# Patient Record
Sex: Female | Born: 1955 | Race: White | Hispanic: No | Smoking: Never smoker
Health system: Southern US, Community
[De-identification: ages and names within clinical notes are randomized; demographics above are authoritative.]

## PROBLEM LIST (undated history)

## (undated) DIAGNOSIS — J189 Pneumonia, unspecified organism: Secondary | ICD-10-CM

## (undated) DIAGNOSIS — E559 Vitamin D deficiency, unspecified: Secondary | ICD-10-CM

## (undated) DIAGNOSIS — R51 Headache: Secondary | ICD-10-CM

## (undated) DIAGNOSIS — B019 Varicella without complication: Secondary | ICD-10-CM

## (undated) DIAGNOSIS — Z124 Encounter for screening for malignant neoplasm of cervix: Secondary | ICD-10-CM

## (undated) DIAGNOSIS — J45909 Unspecified asthma, uncomplicated: Secondary | ICD-10-CM

## (undated) DIAGNOSIS — E039 Hypothyroidism, unspecified: Secondary | ICD-10-CM

## (undated) DIAGNOSIS — N390 Urinary tract infection, site not specified: Secondary | ICD-10-CM

## (undated) DIAGNOSIS — R030 Elevated blood-pressure reading, without diagnosis of hypertension: Secondary | ICD-10-CM

## (undated) DIAGNOSIS — M8580 Other specified disorders of bone density and structure, unspecified site: Secondary | ICD-10-CM

## (undated) DIAGNOSIS — K602 Anal fissure, unspecified: Secondary | ICD-10-CM

## (undated) DIAGNOSIS — B059 Measles without complication: Secondary | ICD-10-CM

## (undated) DIAGNOSIS — E785 Hyperlipidemia, unspecified: Secondary | ICD-10-CM

## (undated) DIAGNOSIS — K589 Irritable bowel syndrome without diarrhea: Secondary | ICD-10-CM

## (undated) DIAGNOSIS — Z0000 Encounter for general adult medical examination without abnormal findings: Secondary | ICD-10-CM

## (undated) DIAGNOSIS — B269 Mumps without complication: Secondary | ICD-10-CM

## (undated) HISTORY — DX: Other specified disorders of bone density and structure, unspecified site: M85.80

## (undated) HISTORY — DX: Measles without complication: B05.9

## (undated) HISTORY — DX: Elevated blood-pressure reading, without diagnosis of hypertension: R03.0

## (undated) HISTORY — DX: Anal fissure, unspecified: K60.2

## (undated) HISTORY — DX: Unspecified asthma, uncomplicated: J45.909

## (undated) HISTORY — DX: Urinary tract infection, site not specified: N39.0

## (undated) HISTORY — DX: Varicella without complication: B01.9

## (undated) HISTORY — DX: Encounter for general adult medical examination without abnormal findings: Z00.00

## (undated) HISTORY — PX: BREAST SURGERY: SHX581

## (undated) HISTORY — DX: Mumps without complication: B26.9

## (undated) HISTORY — DX: Hypothyroidism, unspecified: E03.9

## (undated) HISTORY — PX: ABDOMINAL HYSTERECTOMY: SHX81

## (undated) HISTORY — DX: Hyperlipidemia, unspecified: E78.5

## (undated) HISTORY — PX: INCISE AND DRAIN ABCESS: PRO64

## (undated) HISTORY — DX: Pneumonia, unspecified organism: J18.9

## (undated) HISTORY — DX: Headache: R51

## (undated) HISTORY — DX: Vitamin D deficiency, unspecified: E55.9

## (undated) HISTORY — DX: Irritable bowel syndrome without diarrhea: K58.9

## (undated) HISTORY — PX: PILONIDAL CYST EXCISION: SHX744

## (undated) HISTORY — DX: Encounter for screening for malignant neoplasm of cervix: Z12.4

---

## 1998-10-05 ENCOUNTER — Other Ambulatory Visit: Admission: RE | Admit: 1998-10-05 | Discharge: 1998-10-05 | Payer: Self-pay | Admitting: Radiology

## 1999-02-13 ENCOUNTER — Emergency Department (HOSPITAL_COMMUNITY): Admission: EM | Admit: 1999-02-13 | Discharge: 1999-02-13 | Payer: Self-pay | Admitting: *Deleted

## 1999-02-13 ENCOUNTER — Encounter: Payer: Self-pay | Admitting: Emergency Medicine

## 1999-09-21 ENCOUNTER — Other Ambulatory Visit: Admission: RE | Admit: 1999-09-21 | Discharge: 1999-09-21 | Payer: Self-pay | Admitting: Obstetrics and Gynecology

## 2001-01-31 ENCOUNTER — Other Ambulatory Visit: Admission: RE | Admit: 2001-01-31 | Discharge: 2001-01-31 | Payer: Self-pay | Admitting: Obstetrics and Gynecology

## 2001-02-06 ENCOUNTER — Ambulatory Visit (HOSPITAL_COMMUNITY): Admission: RE | Admit: 2001-02-06 | Discharge: 2001-02-06 | Payer: Self-pay | Admitting: Obstetrics and Gynecology

## 2001-02-06 ENCOUNTER — Encounter: Payer: Self-pay | Admitting: Obstetrics and Gynecology

## 2001-02-28 ENCOUNTER — Encounter: Payer: Self-pay | Admitting: Obstetrics and Gynecology

## 2001-02-28 ENCOUNTER — Ambulatory Visit (HOSPITAL_COMMUNITY): Admission: RE | Admit: 2001-02-28 | Discharge: 2001-02-28 | Payer: Self-pay | Admitting: Obstetrics and Gynecology

## 2001-03-26 ENCOUNTER — Encounter: Payer: Self-pay | Admitting: Obstetrics and Gynecology

## 2001-03-26 ENCOUNTER — Ambulatory Visit (HOSPITAL_COMMUNITY): Admission: RE | Admit: 2001-03-26 | Discharge: 2001-03-26 | Payer: Self-pay | Admitting: Obstetrics and Gynecology

## 2001-04-17 ENCOUNTER — Inpatient Hospital Stay (HOSPITAL_COMMUNITY): Admission: RE | Admit: 2001-04-17 | Discharge: 2001-04-19 | Payer: Self-pay | Admitting: Obstetrics and Gynecology

## 2001-04-17 ENCOUNTER — Encounter (INDEPENDENT_AMBULATORY_CARE_PROVIDER_SITE_OTHER): Payer: Self-pay | Admitting: Specialist

## 2002-07-25 ENCOUNTER — Other Ambulatory Visit: Admission: RE | Admit: 2002-07-25 | Discharge: 2002-07-25 | Payer: Self-pay | Admitting: Internal Medicine

## 2003-07-28 ENCOUNTER — Other Ambulatory Visit: Admission: RE | Admit: 2003-07-28 | Discharge: 2003-07-28 | Payer: Self-pay | Admitting: Internal Medicine

## 2003-08-05 ENCOUNTER — Observation Stay (HOSPITAL_COMMUNITY): Admission: EM | Admit: 2003-08-05 | Discharge: 2003-08-06 | Payer: Self-pay | Admitting: *Deleted

## 2003-09-04 ENCOUNTER — Encounter: Admission: RE | Admit: 2003-09-04 | Discharge: 2003-09-04 | Payer: Self-pay | Admitting: Family Medicine

## 2003-09-18 ENCOUNTER — Encounter: Admission: RE | Admit: 2003-09-18 | Discharge: 2003-09-18 | Payer: Self-pay | Admitting: Family Medicine

## 2005-12-13 ENCOUNTER — Ambulatory Visit: Payer: Self-pay | Admitting: Family Medicine

## 2005-12-15 ENCOUNTER — Encounter: Admission: RE | Admit: 2005-12-15 | Discharge: 2005-12-15 | Payer: Self-pay | Admitting: Family Medicine

## 2006-07-19 ENCOUNTER — Ambulatory Visit: Payer: Self-pay | Admitting: Family Medicine

## 2006-08-01 ENCOUNTER — Ambulatory Visit: Payer: Self-pay | Admitting: Family Medicine

## 2007-08-16 LAB — HM MAMMOGRAPHY

## 2007-08-16 LAB — HM PAP SMEAR

## 2007-08-22 ENCOUNTER — Telehealth: Payer: Self-pay

## 2007-08-22 ENCOUNTER — Ambulatory Visit: Payer: Self-pay | Admitting: Family Medicine

## 2007-08-22 DIAGNOSIS — K5289 Other specified noninfective gastroenteritis and colitis: Secondary | ICD-10-CM | POA: Insufficient documentation

## 2007-08-22 DIAGNOSIS — R300 Dysuria: Secondary | ICD-10-CM | POA: Insufficient documentation

## 2007-08-22 DIAGNOSIS — R81 Glycosuria: Secondary | ICD-10-CM | POA: Insufficient documentation

## 2007-08-22 LAB — CONVERTED CEMR LAB
Bilirubin Urine: NEGATIVE
Nitrite: NEGATIVE
Specific Gravity, Urine: >=1.03
Urobilinogen, UA: 0.2
WBC Urine, dipstick: NEGATIVE
pH: 5.5

## 2007-09-06 LAB — CONVERTED CEMR LAB
Basophils Absolute: 0.1 10*3/uL (ref 0.0–0.1)
Basophils Relative: 0.9 % (ref 0.0–1.0)
Eosinophils Absolute: 0.1 10*3/uL (ref 0.0–0.6)
Eosinophils Relative: 0.7 % (ref 0.0–5.0)
HCT: 42.6 % (ref 36.0–46.0)
Hemoglobin: 14.8 g/dL (ref 12.0–15.0)
Hgb A1c MFr Bld: 5.6 % (ref 4.6–6.0)
Lymphocytes Relative: 8.2 % — ABNORMAL LOW (ref 12.0–46.0)
MCHC: 34.8 g/dL (ref 30.0–36.0)
MCV: 90.7 fL (ref 78.0–100.0)
Monocytes Absolute: 0.2 10*3/uL (ref 0.2–0.7)
Monocytes Relative: 2.7 % — ABNORMAL LOW (ref 3.0–11.0)
Neutro Abs: 6.8 10*3/uL (ref 1.4–7.7)
Neutrophils Relative %: 87.5 % — ABNORMAL HIGH (ref 43.0–77.0)
Platelets: 212 10*3/uL (ref 150–400)
RBC: 4.7 M/uL (ref 3.87–5.11)
RDW: 12.6 % (ref 11.5–14.6)
WBC: 7.8 10*3/uL (ref 4.5–10.5)

## 2007-10-04 ENCOUNTER — Encounter: Payer: Self-pay | Admitting: Family Medicine

## 2008-06-10 ENCOUNTER — Telehealth: Payer: Self-pay | Admitting: Family Medicine

## 2008-07-15 ENCOUNTER — Ambulatory Visit: Payer: Self-pay | Admitting: Family Medicine

## 2008-07-15 LAB — CONVERTED CEMR LAB
Bilirubin Urine: NEGATIVE
Glucose, Urine, Semiquant: NEGATIVE
Ketones, urine, test strip: NEGATIVE
Nitrite: NEGATIVE
Protein, U semiquant: NEGATIVE
Specific Gravity, Urine: 1.01
Urobilinogen, UA: 0.2
pH: 5.5

## 2008-07-22 ENCOUNTER — Ambulatory Visit: Payer: Self-pay | Admitting: Family Medicine

## 2008-07-22 LAB — CONVERTED CEMR LAB
ALT: 28 units/L (ref 0–35)
AST: 32 units/L (ref 0–37)
Albumin: 3.8 g/dL (ref 3.5–5.2)
Alkaline Phosphatase: 57 units/L (ref 39–117)
BUN: 17 mg/dL (ref 6–23)
Basophils Absolute: 0 10*3/uL (ref 0.0–0.1)
Basophils Relative: 0.9 % (ref 0.0–3.0)
Bilirubin, Direct: 0.1 mg/dL (ref 0.0–0.3)
CO2: 29 meq/L (ref 19–32)
Calcium: 9.3 mg/dL (ref 8.4–10.5)
Chloride: 106 meq/L (ref 96–112)
Cholesterol: 190 mg/dL (ref 0–200)
Creatinine, Ser: 0.7 mg/dL (ref 0.4–1.2)
Eosinophils Absolute: 0.1 10*3/uL (ref 0.0–0.7)
Eosinophils Relative: 1.4 % (ref 0.0–5.0)
GFR calc Af Amer: 112 mL/min
GFR calc non Af Amer: 93 mL/min
Glucose, Bld: 90 mg/dL (ref 70–99)
HCT: 40.1 % (ref 36.0–46.0)
HDL: 66.5 mg/dL (ref >39.0)
Hemoglobin: 13.8 g/dL (ref 12.0–15.0)
LDL Cholesterol: 117 mg/dL — ABNORMAL HIGH (ref 0–99)
Lymphocytes Relative: 26.3 % (ref 12.0–46.0)
MCHC: 34.5 g/dL (ref 30.0–36.0)
MCV: 92.2 fL (ref 78.0–100.0)
Monocytes Absolute: 0.4 10*3/uL (ref 0.1–1.0)
Monocytes Relative: 7.4 % (ref 3.0–12.0)
Neutro Abs: 3.5 10*3/uL (ref 1.4–7.7)
Neutrophils Relative %: 64 % (ref 43.0–77.0)
Platelets: 195 10*3/uL (ref 150–400)
Potassium: 4.5 meq/L (ref 3.5–5.1)
RBC: 4.35 M/uL (ref 3.87–5.11)
RDW: 13 % (ref 11.5–14.6)
Sodium: 140 meq/L (ref 135–145)
TSH: 3.88 microintl units/mL (ref 0.35–5.50)
Total Bilirubin: 0.6 mg/dL (ref 0.3–1.2)
Total CHOL/HDL Ratio: 2.9
Total Protein: 7.1 g/dL (ref 6.0–8.3)
Triglycerides: 34 mg/dL (ref 0–149)
VLDL: 7 mg/dL (ref 0–40)
WBC: 5.4 10*3/uL (ref 4.5–10.5)

## 2008-07-30 LAB — CONVERTED CEMR LAB: Vit D, 1,25-Dihydroxy: 18 — ABNORMAL LOW (ref 30–89)

## 2008-08-15 DIAGNOSIS — K589 Irritable bowel syndrome without diarrhea: Secondary | ICD-10-CM

## 2008-08-15 HISTORY — DX: Irritable bowel syndrome, unspecified: K58.9

## 2008-08-15 LAB — HM DEXA SCAN

## 2008-09-01 ENCOUNTER — Telehealth: Payer: Self-pay | Admitting: Family Medicine

## 2008-09-22 ENCOUNTER — Telehealth: Payer: Self-pay | Admitting: Family Medicine

## 2008-10-07 ENCOUNTER — Encounter: Payer: Self-pay | Admitting: Family Medicine

## 2008-10-29 ENCOUNTER — Telehealth: Payer: Self-pay | Admitting: Family Medicine

## 2008-10-30 ENCOUNTER — Ambulatory Visit: Payer: Self-pay | Admitting: Family Medicine

## 2008-10-30 DIAGNOSIS — H6690 Otitis media, unspecified, unspecified ear: Secondary | ICD-10-CM | POA: Insufficient documentation

## 2008-10-30 DIAGNOSIS — J209 Acute bronchitis, unspecified: Secondary | ICD-10-CM | POA: Insufficient documentation

## 2009-01-13 ENCOUNTER — Telehealth: Payer: Self-pay | Admitting: Family Medicine

## 2009-03-12 ENCOUNTER — Telehealth (INDEPENDENT_AMBULATORY_CARE_PROVIDER_SITE_OTHER): Payer: Self-pay | Admitting: *Deleted

## 2009-05-27 ENCOUNTER — Ambulatory Visit: Payer: Self-pay | Admitting: Family Medicine

## 2009-05-27 ENCOUNTER — Telehealth: Payer: Self-pay | Admitting: Family Medicine

## 2009-05-27 DIAGNOSIS — S93409A Sprain of unspecified ligament of unspecified ankle, initial encounter: Secondary | ICD-10-CM | POA: Insufficient documentation

## 2009-06-04 ENCOUNTER — Ambulatory Visit: Payer: Self-pay | Admitting: Family Medicine

## 2009-06-04 ENCOUNTER — Telehealth: Payer: Self-pay | Admitting: Family Medicine

## 2009-06-04 DIAGNOSIS — M79609 Pain in unspecified limb: Secondary | ICD-10-CM | POA: Insufficient documentation

## 2009-08-06 ENCOUNTER — Telehealth: Payer: Self-pay | Admitting: Family Medicine

## 2009-08-27 ENCOUNTER — Telehealth: Payer: Self-pay | Admitting: Family Medicine

## 2009-09-16 ENCOUNTER — Ambulatory Visit: Payer: Self-pay | Admitting: Family Medicine

## 2009-09-16 DIAGNOSIS — J1189 Influenza due to unidentified influenza virus with other manifestations: Secondary | ICD-10-CM | POA: Insufficient documentation

## 2009-09-16 DIAGNOSIS — H1030 Unspecified acute conjunctivitis, unspecified eye: Secondary | ICD-10-CM | POA: Insufficient documentation

## 2009-10-19 ENCOUNTER — Encounter: Payer: Self-pay | Admitting: Family Medicine

## 2010-09-16 NOTE — Progress Notes (Signed)
Summary: fever blister  Phone Note Call from Patient   Caller: Patient Call For: Judithann Sheen MD Summary of Call: Pt has questions about fever blisters and using creams for pre-cancerous lesions.  [phone]. [phone] Initial call taken by: Lynann Beaver CMA,  August 27, 2009 11:28 AM  Follow-up for Phone Call        Pt returned your call. Please call back asap. [phone] cell Follow-up by: Lucy Antigua,  August 27, 2009 11:57 AM  Additional Follow-up for Phone Call Additional follow up Details #1::        Fever blister started today...Marland KitchenMarland KitchenUsing Lamisil for feet. Wants to know if she can have Valtrex?  Uses [facility] ([place]). Additional Follow-up by: Lynann Beaver CMA,  August 27, 2009 1:24 PM

## 2010-09-16 NOTE — Assessment & Plan Note (Signed)
Summary: COUGH HEADACHE CHILLS ACHE WHITE SPOTS SEEING/PS   Vital Signs:  Patient profile:   55 year old female Weight:      142 pounds Temp:     98.6 degrees F oral Pulse rate:   104 / minute Pulse rhythm:   regular Resp:     16 per minute BP sitting:   120 / 82  Vitals Entered By: Lynann Beaver CMA (September 16, 2009 3:43 PM) CC: chills/mylagias x 2 days Is Patient Diabetic? No Pain Assessment Patient in pain? no        History of Present Illness: this 55 year old white married female is in complaint of onset of generalized aching and fever and chills 2 days ago with nonproductive cough and sore throat some facial pain. No nausea vomiting or diarrhea No urinary frequency nor dysuria  Allergies: No Known Drug Allergies  Past History:  Past Medical History: Last updated: 07/22/2008 PAP     2009 MAMMOGRAM   2009 DEXA-BONE DENSITY    due  09/2008 PNEUMONIA VACCINE      DT   2000 Colonscopy  will sch  SMOKER   never     Review of Systems  The patient denies anorexia, fever, weight loss, weight gain, vision loss, decreased hearing, hoarseness, chest pain, syncope, dyspnea on exertion, peripheral edema, prolonged cough, headaches, hemoptysis, abdominal pain, melena, hematochezia, severe indigestion/heartburn, hematuria, incontinence, genital sores, muscle weakness, suspicious skin lesions, transient blindness, difficulty walking, depression, unusual weight change, abnormal bleeding, enlarged lymph nodes, angioedema, breast masses, and testicular masses.    Physical Exam  General:  appears acutely L.  alert, well-developed, well-nourished, and well-hydrated.   Head:  Normocephalic and atraumatic without obvious abnormalities. No apparent alopecia or balding. Eyes:  conduct out of left eye erythematous with mild exudate, yellow Ears:  External ear exam shows no significant lesions or deformities.  Otoscopic examination reveals clear canals, tympanic membranes are intact  bilaterally without bulging, retraction, inflammation or discharge. Hearing is grossly normal bilaterally. Nose:  minimal congestion Mouth:  erythematous pharynx but not beefy red Neck:  No deformities, masses, or tenderness noted. Chest Wall:  No deformities, masses, or tenderness noted. Breasts:  not examined Lungs:  minimal rhonchino dullness, no fremitus, no crackles, and no wheezes.   Heart:  Normal rate and regular rhythm. S1 and S2 normal without gallop, murmur, click, rub or other extra sounds. Abdomen:  Bowel sounds positive,abdomen soft and non-tender without masses, organomegaly or hernias noted.   Impression & Recommendations:  Problem # 1:  CONJUNCTIVITIS, ACUTE, LEFT (ICD-372.00) Assessment New  Her updated medication list for this problem includes:    Tobramycin Sulfate 0.3 % Soln (Tobramycin sulfate) .Marland Kitchen... 1 qtt in eye tid  Orders: Prescription Created Electronically [phone])  Problem # 2:  INFLUENZA (ICD-487.8) Assessment: New  The following medications were removed from the medication list:    Valtrex 1 Gm Tabs (Valacyclovir hcl) .... Tamiflu b.i.d. x7 days prescribed today  Orders: Prescription Created Electronically [id number])  Complete Medication List: 1)  Epipen 2-pak 0.3 Mg/0.59ml Devi (Epinephrine) .... Use as directed 2)  Lamisil 250 Mg Tabs (Terbinafine hcl) .... As directed 3)  Tamiflu 75 Mg Caps (Oseltamivir phosphate) .Marland Kitchen.. 1 two times a day to help control flu 4)  Tobramycin Sulfate 0.3 % Soln (Tobramycin sulfate) .Marland Kitchen.. 1 qtt in eye tid 5)  Hydromet 5-1.5 Mg/49ml Syrp (Hydrocodone-homatropine) .Marland Kitchen.. 1-2 tsp q4h as needed cough  Patient Instructions: 1)  beginning flu  2)  good fluid in take  3)  hydromet cough  expectorant 1-2 tsp q4h as needed cough 4)  tamiflu 1 two times a dayto help control flu 5)  ibuprofen 200 mg 3-4 every 4-6 hrs for aching pain or fever 6)  tobraycin drops for conjunctivitis left eye Prescriptions: HYDROMET 5-1.5 MG/5ML SYRP  (HYDROCODONE-HOMATROPINE) 1-2 tsp q4h as needed cough  #240 cc x 1   Entered and Authorized by:   Judithann Sheen MD   Signed by:   Judithann Sheen MD on 09/16/2009   Method used:   Print then Give to Patient   RxID:   [id number] TOBRAMYCIN SULFATE 0.3 % SOLN (TOBRAMYCIN SULFATE) 1 qtt in eye tid  #5.0 cc x 0   Entered and Authorized by:   Judithann Sheen MD   Signed by:   Judithann Sheen MD on 09/16/2009   Method used:   Electronically to        [facility] # [phone]* (retail)       [address]       [place], [place]  [address]       Ph: [phone]       Fax: [phone]   RxID:   [id number] TAMIFLU 75 MG CAPS (OSELTAMIVIR PHOSPHATE) 1 two times a day TO help control flu  #10 x 1   Entered and Authorized by:   Judithann Sheen MD   Signed by:   Judithann Sheen MD on 09/16/2009   Method used:   Electronically to        [facility] # [phone]* (retail)       [address]       [place], [place]  [address]       Ph: [phone]       Fax: [phone]   RxID:   [id number]

## 2010-09-24 ENCOUNTER — Ambulatory Visit (INDEPENDENT_AMBULATORY_CARE_PROVIDER_SITE_OTHER): Payer: 59 | Admitting: Internal Medicine

## 2010-09-24 ENCOUNTER — Encounter: Payer: Self-pay | Admitting: Internal Medicine

## 2010-09-24 VITALS — BP 120/80 | HR 98 | Temp 98.8°F | Ht 62.0 in | Wt 148.0 lb

## 2010-09-24 DIAGNOSIS — J069 Acute upper respiratory infection, unspecified: Secondary | ICD-10-CM

## 2010-09-24 MED ORDER — AZITHROMYCIN 250 MG PO TABS: ORAL_TABLET | ORAL | Status: AC

## 2010-09-24 NOTE — Assessment & Plan Note (Signed)
With suspected viral conjunctivitis. Monitor eye drainage for change to mucopurulent. Notify clinic if develops. Given abx to hold for URI. Begin if sx do not improve after total duration of 8-10 days. Followup if no improvement or worsening.

## 2010-09-24 NOTE — Progress Notes (Signed)
  Subjective:    Patient ID: Susan Terrell, female    DOB: 1955-09-11, 55 y.o.   MRN: [id number]  HPI Pt presents to clinic for evaluation of eye irritation. Notes ~ 3d h/o nasal drainage/congestion and myalgias. Now with 1 d h/o clear drainage from left eye with associated ear irritation medially. No FB exposure or trauma. No change in visual acuity. No sick exposure. No alleviating or exacerbating factors.   Reviewed PMH, medications, and allergies.    Review of Systems  Constitutional: Negative for fever and chills.  HENT: Positive for congestion and rhinorrhea. Negative for ear pain and facial swelling.   Eyes: Positive for discharge. Negative for photophobia, pain, redness, itching and visual disturbance.  Respiratory: Negative for cough.         Objective:   Physical Exam  Constitutional: She appears well-developed and well-nourished. No distress.  HENT:  Head: Normocephalic and atraumatic.  Right Ear: Tympanic membrane and external ear normal.  Left Ear: Tympanic membrane and external ear normal.  Nose: Nose normal.  Mouth/Throat: Oropharynx is clear and moist. No oropharyngeal exudate.  Eyes: EOM are normal. Pupils are equal, round, and reactive to light. Right eye exhibits no discharge and no exudate. No foreign body present in the right eye. Left eye exhibits discharge. Left eye exhibits no exudate. No foreign body present in the left eye. Right conjunctiva is not injected. Right conjunctiva has no hemorrhage. Left conjunctiva is injected. Left conjunctiva has no hemorrhage.  Neurological: She is alert.  Skin: Skin is warm and dry. No rash noted. She is not diaphoretic.          Assessment & Plan:

## 2010-11-04 ENCOUNTER — Telehealth: Payer: Self-pay | Admitting: Family Medicine

## 2010-11-04 NOTE — Telephone Encounter (Signed)
Laryngitis, stuffy, and would like to be advised as to whether an office visit is needed or medication can be called in----please advise

## 2010-12-31 NOTE — Op Note (Signed)
[facility]  Patient:    Susan Terrell, Susan Terrell Visit Number: [id number] MRN: [id number]          Service Type: GYN Location: 4W [id number] 01 Attending Physician:  Malon Kindle Proc. Date: 04/17/01 Admit Date:  04/17/2001                             Operative Report  PREOPERATIVE DIAGNOSES:  Menorrhagia, corrected anemia with iron, persistent left adnexal mass.  POSTOPERATIVE DIAGNOSES:  Menorrhagia, corrected anemia with iron, persistent left adnexal mass, endometrioma of the left ovary, endometriosis of the left broad ligament, and endometriosis of the rectum.  OPERATION:  Abdominal hysterectomy, bilateral salpingo-oophorectomy.  OPERATOR:  Malachi Pro. Ambrose Mantle, M.D.  ASSISTANT:  Zenaida Niece, M.D.  ANESTHESIA:  General.  DESCRIPTION OF PROCEDURE:  The patient was brought to the operating room and placed under satisfactory general anesthesia.  The abdomen was prepped with Betadine solution.  The vagina and urethra were prepped.  Foley catheter was inserted to straight drain.  Exam revealed the uterus to be anterior, upper limit of normal size.  The adnexa was clear.  I could not feel the left adnexal mass that showed up on ultrasound.  The patient was then placed supine.  The abdomen was draped as a sterile field.  A transverse incision was made and carried in layers through the skin and subcutaneous tissue and fascia.  The fascia was separated from the rectus muscles superiorly and inferiorly.  The rectus muscle was then split in the midline.  The peritoneum was opened vertically.  Exploration of the upper abdomen revealed both kidney to feel normal.  The liver was normal.  The gallbladder was small and cystic without stones.  Exploration of the pelvis revealed some nodularity in the cul-de-sac posteriorly.  The uterus was anterior, upper limit of normal size. The anterior cul-de-sac was normal.  The right tube and ovary were normal. The  left tube and ovary, in spite of the fact that the left tube had shown to be a hydrosalpinx on ultrasound, was completely normal.  The left ovary was adherent to the posterior broad ligament and on separating the left ovary from the posterior broad ligament, endometriosis-like fluid spilled out of the ovary.  There was also about a 4 mm area of what was thought to be endometriosis on the left broad ligament.  The decision had already been made to remove the uterus, tubes, and ovaries.  Before I had separated the left ovary from the left broad ligament, pelvic washings were obtained, but these were discarded when the diagnosis seemed to be assured that it was benign. Packs and retractors were used for exposure.  The upper pedicles were clamped across.  Both round ligaments were divided with the Bovie.  A bladder flap was developed.  Then both infundibulopelvic ligaments were divided between clamps and doubly suture ligated.  The uterine vessels were skeletonized, clamped, cut, and suture ligated, and the parametrial and paracervical tissues were clamped, cut, and suture ligated.  The left uterosacral ligament and right uterosacral ligaments were clamped, cut, and suture ligated and held, and it was noted that there was nodularity on the surface of the rectum consistent with endometriosis with dark, black pigmentation under the serosa.  The rectum was kept well-free of the lower uterine segment at all times.  It seemed to be adherent in the cul-de-sac rather than to the posterior uterus.  The left vaginal  angle was entered, and then the uterus was removed by transecting the upper vagina.  Bilateral vaginal angled sutures were placed, and then the central portion of the vagina was closed with several interrupted figure-of-eight sutures of 0 Vicryl.  Liberal irrigation confirmed that additional hemostasis was required, both with suture and Bovie, and then it seemed that hemostasis was complete.   Both ureters were identified and were normal in size and caliber and configuration.  Reperitonealization was done over the vagina after uterosacral ligaments were sutured together in the midline.  At this point, all packs and retractors were removed.  The peritoneum was closed with a running suture of 0 Vicryl, the rectus muscle with interrupted 0 Vicryl, the fascia with two running sutures of 0 Vicryl, subcu with a running 3-0 Vicryl, and the skin was closed with automatic staples.  The patient seemed to tolerate the procedure well.  Blood loss was about 300 cc.  Sponge and needle counts were correct, and the patient was returned to recovery in satisfactory condition. Attending Physician:  Malon Kindle DD:  04/17/01 TD:  04/17/01 Job: [id number]

## 2010-12-31 NOTE — H&P (Signed)
[facility]  Patient:    XOIE, KREUSER Visit Number: [id number] MRN: [id number]          Service Type: Attending:  Malachi Pro. Ambrose Mantle, M.D. Dictated by:   Malachi Pro. Ambrose Mantle, M.D.                           History and Physical  SCHEDULED ADMISSION DATE:  04/17/2001.  HISTORY OF PRESENT ILLNESS:  This is a 55 year old white, married female, para 2-0-0-2, admitted to the hospital for abdominal hysterectomy, bilateral salpingo-oophorectomy because of severe menorrhagia, anemia that has been partially corrected with iron, a persistent complex left ovarian mass. Last menstrual period March 21, 2001. The patients periods occur at about 20 to 28 day intervals, last three to four days. They are heavy, sometimes she floods. She gets up during the night to change. During the day she changes her pad and tampon at the same time. On January 31, 2001 she was noted to have a hemoglobin of 9.5, hematocrit 30.3 with indices suggestive of iron deficiency anemia. She was placed on iron and by July 19, her hemoglobin had risen to 10.5 and she was advised to continue the iron. An ultrasound was ordered at the examination on June 19 to see if there was a uterine abnormality that could be corrected with a D&C. The ultrasound did not show any significant abnormality of the uterus. The endometrial stripe was normal measuring 3 to 4 mm; however, there was a complex mass measuring 2.6 x 2.4 x 2.4 cm in the left ovary. Differential diagnosis was hemorrhagic cyst, endometrioma, and a followup ultrasound was suggested. The patient underwent a repeat ultrasound on February 28, 2001. At that time the uterus again appeared to be normal except that there was evidence of a possible adenomyosis. There clearly was fluid in a dilated left fallopian tube consistent with a hydrosalpinx and again, the complex ovoid mass of the left ovary was noted measuring 2.4 x 2.9 x 2 cm. The differential at this time  was considered to either be a teratoma or dermoid. Ovarian cancer could not be completely excluded; however, the mass had not increased in size since the prior exam. Endometrioma was felt to be less likely. The patient wanted to proceed with surgery but she wanted to have a third ultrasound, so a third ultrasound was performed on March 26, 2001. Again, it showed a normal sized uterus with cystic spaces at the junction of the endometrium and myometrium suggesting adenomyosis. The right ovary was normal. The left ovary again measured 3.9 x 2.8 x 3.0 cm and inside the ovary was a hypoechoic mass measuring 2.8 x 2.4 x 2.4 cm. It was well circumscribed with fairly homogenous echoes, several focal areas peripheral increased echogenicity were again identified. The most likely etiology was still felt to either represent a teratoma or a dermoid. Fluid was again seen in the dilated fallopian tube on the left consistent with hydrosalpinx. The patient is now admitted for surgery to remove the uterus, tubes, and ovaries.  PAST MEDICAL HISTORY:  ALLERGIES:  CODEINE, MACROBID, and MACRODANTIN and IODINE.  MEDICATIONS:  She is taking iron.  OPERATIONS:  She had a Bartholins cyst marsupialization in 1983, pilonidal cyst drained, and a left elbow surgery.  ILLNESSES:  She has had no major adult illnesses. She does have mitral valve prolapse.  SOCIAL HISTORY:   She does not drink or smoke.  REVIEW OF SYSTEMS:   She has  had occasional headaches recently on the left side. She has had no slurred speech, no blurred vision, no weakness or paralysis. She also experienced some chest tightness recently. The patient does report decreased sex drive.  FAMILY HISTORY:  Mother is 69 with high blood pressure. Father in his 6s with heart trouble. Four sisters and two brothers are living and well.  PHYSICAL EXAMINATION:  GENERAL:  Well-developed, well-nourished white female in no acute distress.  VITAL  SIGNS:  Weight is 130 pounds. Blood pressure 140/90, pulse 80.  HEENT:   No cranial abnormalities, extraocular movements intact. Nose and pharynx clear.  NECK:  Supple without thyromegaly.  BREASTS:  Soft without masses.  HEART:  Normal size and sounds, no murmurs.  LUNGS:  Clear to P&A.  ABDOMEN:  Soft, nontender, no masses palpable. Liver, spleen, and kidney not felt.  PAPANICOLAOU SMEAR:  Pap smear on January 31, 2001 was satisfactory for evaluation and was considered normal. There were some benign appearing endometrial cells present but it was in the part of the cycle when one would not expect her endometrial cells to be shedding but with a normal ultrasound it was note considered significant to pursue.  PELVIC:  The vulva and vagina were clean. The cervix was clean. The uterus was anterior, normal size. Adnexa clear. It is thick on the left.  RECTAL:  Rectal exam done in June was within normal limits except the patient does have a thin anterior sphincter.  ADMITTING IMPRESSION: 1. Persistent left adnexal mass. 2. Menorrhagia. 3. Anemia.  PLAN:  The patient is admitted for abdominal hysterectomy and bilateral salpingo-oophorectomy. She understands that if the mass is malignant, that she may require a second incision to evaluate her lymph nodes, but the mass is considered to be a very low likelihood of malignancy. She understands the risks included heart attack, stroke, pulmonary embolus, wound disruption, hemorrhage with need for reoperation and/or transfusion, fistula formation, nerve injury, intestinal obstruction, and infection. She also understands that it might have an unpredictable effect on her sex drive which is already low. She understands, as well as her husband understands, and they agree to proceed. Dictated by:   Malachi Pro. Ambrose Mantle, M.D. Attending:  Malachi Pro. Ambrose Mantle, M.D. DD:  04/16/01 TD:  04/16/01 Job: [id number]

## 2010-12-31 NOTE — Discharge Summary (Signed)
[facility]  Patient:    Susan Terrell, Susan Terrell Visit Number: [id number] MRN: [id number]          Service Type: GYN Location: 4W 0446 01 Attending Physician:  Malon Kindle Dictated by:   Malachi Pro. Ambrose Mantle, M.D. Admit Date:  04/17/2001 Discharge Date: 04/19/2001                             Discharge Summary  HISTORY OF PRESENT ILLNESS:  The patient is a 54 year old white female with menorrhagia and anemia corrected with iron, and persistent left adnexal mass, admitted for hysterectomy and bilateral salpingo-oophorectomy.  HOSPITAL COURSE:  The patient underwent the procedure on 04/17/01, with findings of the left ovary enlarged and adherent to the left posterior broad ligament. It had a chocolate cyst present that ruptured during the removal.  There was endometriosis on the surface of the rectum, and there was a left broad ligament endometriosis.  Hysterectomy and bilateral salpingo-oophorectomy performed by Dr. Ambrose Mantle with Dr. Derrel Nip assisting under general anesthesia with blood loss about 300 cc.  Postoperatively, the patient did quite well. She was given a suppository on the first postoperative day, did not pass flatus, but her abdomen has remained soft and nontender.  She is tolerating a liquid diet.  On the second postoperative day, she was felt to be a candidate for discharge.  Her bowel sounds were normal, but there was no flatus. Pathology report showed uterus, ovaries, and tubes with slight cervicitis. Secretory endometrium, adenomyosis, right and left ovaries benign follicular cysts, no endometriosis or evidence of malignancy identified.  I will have the pathologist relook at the left ovary because clinically there was certainly endometriosis, evidenced by 3 months enlargement of the left ovary without resolution, a cyst that contained internal echos, chocolate fluid coming out of the cyst when it ruptured during the removal, endometriosis  on the serosa of the rectum, and endometriosis on the left broad ligament.  White count was 6300, hemoglobin 12.6, hematocrit 37.8, platelets 213,000.  Followup hematocrit 33.6 and 33.3, 71 segs, 18 lymphs, 6 monos, 5 eosinophils, and 0 basophils.  Comprehensive metabolic panel was normal.  Urinalysis was negative.  EKG showed marked sinus bradycardia.  FINAL DIAGNOSES: 1. Persistent left adnexal mass secondary to a chocolate cyst of the left    ovary that was adherent to the posterior leaf of the broad ligament,    however, no endometriosis identified on path.  The pathologist will be    asked to relook at the specimen. 2. Adenomyosis. 3. Menorrhagia. 4. History of anemia, corrected with iron.  OPERATION:  Abdominal hysterectomy and bilateral salpingo-oophorectomy.  CONDITION ON DISCHARGE:  Improved.  DISCHARGE INSTRUCTIONS: 1. Our regular discharge instructions. 2. No vaginal entrance. 3. No heavy lifting. 4. No strenuous activity. 5. Call with fever above 100.4 degrees.  Call with any unusual problems,    including vaginal bleeding or signs of an intestinal obstruction, including    nausea, vomiting, bloating of the abdomen and distention.  FOLLOWUP:  The patient is to return in 4 days for staple removal.  DISCHARGE MEDICATIONS:  Mepergan Fortis 16 tablets one q.4-6h. p.r.n. pain. Dictated by:   Malachi Pro. Ambrose Mantle, M.D. Attending Physician:  Malon Kindle DD:  04/19/01 TD:  04/19/01 Job: [id number]

## 2010-12-31 NOTE — Discharge Summary (Signed)
NAME:  Susan Terrell, Susan Terrell                         ACCOUNT NO.:  [id number]   MEDICAL RECORD NO.:  [id number]                   PATIENT TYPE:  OBV   LOCATION:  0358                                 FACILITY:  [facility]   PHYSICIAN:  Rene Paci, M.D. [facility]          DATE OF BIRTH:  [birth year]   DATE OF ADMISSION:  08/05/2003  DATE OF DISCHARGE:  08/06/2003                                 DISCHARGE SUMMARY   DISCHARGE DIAGNOSES:  1. Chest/back pain, atypical, suspect musculoskeletal.  2. Status post total abdominal hysterectomy.   DISCHARGE MEDICATIONS:  1. Flexeril 5 mg p.o. q.8-12h. p.r.n. muscle spasm.  2. Baby aspirin 81 mg p.o. daily.   CONDITION ON DISCHARGE:  Medically stable.  Chest and back pain improved.   DISPOSITION:  Home.   FOLLOWUP:  With her primary care physician, Tawny Asal, M.D., et al  to be arranged by patient in approximately three weeks.  At this time should  be arranged for an outpatient stress test for further risk stratification as  well as follow-up on his fasting lipid profile pending at time of discharge.   HOSPITAL COURSE:  Problem 1 - CHEST PAIN:  The patient is a 55 year old  white female with no significant risk factors for coronary disease who felt  the sudden onset of chest and back pain, especially near her right shoulder  morning of admission while at work.  She was nauseated, felt unable to catch  her breath, and thought she might pass out.  She laid down in the floor,  called for EMS.  She was given oxygen which initially caused her to feel  better, but then was followed by tightness in her chest.  She was brought to  the emergency room for further evaluation and given nitroglycerin tablets  and morphine which seemed to relieve the tightness, but not the initial  pain.  No previous symptoms of such.  Family history with mother's relatives  having various cardiac abnormalities in their later years (24s) and a  brother with a questionable  small heart attack in his early 47s.  No history  of hypercholesterolemia, diabetes, or hypertension and she does not smoke.  She was admitted to telemetry for rule out MI after a CT of the chest was  obtained to rule out PE for dissection.  As the CT was negative, enzymes  were followed as was telemetry for arrhythmias and these were also negative.  Since her pain was likely musculoskeletal she was also given Flexeril while  in the hospital which she said seemed to relieve some of her pain.  She is  felt stable for discharge home in outpatient follow-up to undergo a routine  stress test for further risk stratification.  Proton pump was offered for  possible GERD but patient declined.  The patient is encouraged to take one  baby aspirin daily given questionable family history of coronary artery  disease.  She is  also welcome to use other over-the-counter pain medications  as needed for her discomfort.  She is reminded to return to the emergency  room if chest pain persists or changes or otherwise contact her primary care  physician for further instructions.                                               Rene Paci, M.D. [facility]    VL/MEDQ  D:  08/06/2003  T:  08/06/2003  Job:  [id number]

## 2011-08-16 DIAGNOSIS — M8580 Other specified disorders of bone density and structure, unspecified site: Secondary | ICD-10-CM

## 2011-08-16 HISTORY — DX: Other specified disorders of bone density and structure, unspecified site: M85.80

## 2011-09-12 ENCOUNTER — Ambulatory Visit (INDEPENDENT_AMBULATORY_CARE_PROVIDER_SITE_OTHER): Payer: 59 | Admitting: Internal Medicine

## 2011-09-12 ENCOUNTER — Encounter: Payer: Self-pay | Admitting: Internal Medicine

## 2011-09-12 ENCOUNTER — Ambulatory Visit (INDEPENDENT_AMBULATORY_CARE_PROVIDER_SITE_OTHER)
Admission: RE | Admit: 2011-09-12 | Discharge: 2011-09-12 | Disposition: A | Discharge status: Home / Self Care | Payer: 59 | Source: Ambulatory Visit | Attending: Internal Medicine | Admitting: Internal Medicine

## 2011-09-12 VITALS — BP 138/82 | HR 74 | Temp 98.8°F | Resp 20 | Wt 144.0 lb

## 2011-09-12 DIAGNOSIS — R05 Cough: Secondary | ICD-10-CM

## 2011-09-12 DIAGNOSIS — R059 Cough, unspecified: Secondary | ICD-10-CM

## 2011-09-12 DIAGNOSIS — J209 Acute bronchitis, unspecified: Secondary | ICD-10-CM | POA: Insufficient documentation

## 2011-09-12 MED ORDER — CEFUROXIME AXETIL 500 MG PO TABS: 500.0000 mg | ORAL_TABLET | Freq: Two times a day (BID) | ORAL | Status: AC

## 2011-09-12 MED ORDER — HYDROCODONE-HOMATROPINE 5-1.5 MG/5ML PO SYRP: 5.0000 mL | ORAL_SOLUTION | Freq: Four times a day (QID) | ORAL | Status: DC | PRN

## 2011-09-12 NOTE — Progress Notes (Signed)
Subjective:    Patient ID: Susan Terrell, female    DOB: 1955/12/02, 56 y.o.   MRN: [id number]  Cough This is a new problem. The current episode started in the past 7 days. The problem has been gradually worsening. The problem occurs every few hours. The cough is productive of purulent sputum. Associated symptoms include chills, a sore throat and sweats. Pertinent negatives include no chest pain, ear congestion, ear pain, fever, headaches, heartburn, hemoptysis, myalgias, nasal congestion, postnasal drip, rash, rhinorrhea, shortness of breath, weight loss or wheezing. The symptoms are aggravated by nothing.      Review of Systems  Constitutional: Positive for chills. Negative for fever, weight loss, diaphoresis, activity change, appetite change, fatigue and unexpected weight change.  HENT: Positive for sore throat. Negative for ear pain, nosebleeds, congestion, facial swelling, rhinorrhea, sneezing, trouble swallowing, neck pain, neck stiffness, voice change, postnasal drip and sinus pressure.   Eyes: Negative.   Respiratory: Positive for cough. Negative for hemoptysis, chest tightness, shortness of breath, wheezing and stridor.   Cardiovascular: Negative for chest pain, palpitations and leg swelling.  Gastrointestinal: Negative for heartburn, nausea, vomiting, abdominal pain, diarrhea, constipation and blood in stool.  Genitourinary: Negative.   Musculoskeletal: Negative for myalgias, back pain, joint swelling, arthralgias and gait problem.  Skin: Negative for color change, pallor, rash and wound.  Neurological: Negative.  Negative for headaches.  Hematological: Negative for adenopathy. Does not bruise/bleed easily.  Psychiatric/Behavioral: Negative.        Objective:   Physical Exam  Vitals reviewed. Constitutional: She is oriented to person, place, and time. She appears well-developed and well-nourished. No distress.  HENT:  Head: Normocephalic and atraumatic.  Mouth/Throat:  Oropharynx is clear and moist. No oropharyngeal exudate.  Eyes: Conjunctivae are normal. Right eye exhibits no discharge. Left eye exhibits no discharge. No scleral icterus.  Neck: Normal range of motion. Neck supple. No JVD present. No tracheal deviation present. No thyromegaly present.  Cardiovascular: Normal rate, regular rhythm, normal heart sounds and intact distal pulses.  Exam reveals no gallop and no friction rub.   No murmur heard. Pulmonary/Chest: Effort normal. No accessory muscle usage or stridor. Not tachypneic. No respiratory distress. She has no decreased breath sounds. She has no wheezes. She has no rhonchi. She has rales in the right lower field. She exhibits no tenderness.  Abdominal: Soft. Bowel sounds are normal. She exhibits no distension and no mass. There is no tenderness. There is no rebound and no guarding.  Musculoskeletal: Normal range of motion. She exhibits no edema and no tenderness.  Lymphadenopathy:    She has no cervical adenopathy.  Neurological: She is oriented to person, place, and time.  Skin: Skin is warm and dry. No rash noted. She is not diaphoretic. No erythema. No pallor.  Psychiatric: She has a normal mood and affect. Her behavior is normal. Judgment and thought content normal.      Lab Results  Component Value Date   WBC 5.4 07/15/2008   HGB 13.8 07/15/2008   HCT 40.1 07/15/2008   PLT 195 07/15/2008   GLUCOSE 90 07/15/2008   CHOL 190 07/15/2008   TRIG 34 07/15/2008   HDL 66.5 07/15/2008   LDLCALC 117* 07/15/2008   ALT 28 07/15/2008   AST 32 07/15/2008   NA 140 07/15/2008   K 4.5 07/15/2008   CL 106 07/15/2008   CREATININE 0.7 07/15/2008   BUN 17 07/15/2008   CO2 29 07/15/2008   TSH 3.88 07/15/2008   HGBA1C 5.6  08/22/2007      Assessment & Plan:

## 2011-09-12 NOTE — Assessment & Plan Note (Signed)
I will check a CXR to look for pna, mass, edema 

## 2011-09-12 NOTE — Patient Instructions (Signed)

## 2011-09-12 NOTE — Assessment & Plan Note (Signed)
Start ceftin for the infection and a cough suppressant 

## 2011-09-13 ENCOUNTER — Telehealth: Payer: Self-pay

## 2011-09-13 ENCOUNTER — Encounter: Payer: Self-pay | Admitting: Internal Medicine

## 2011-09-13 NOTE — Telephone Encounter (Signed)
normal

## 2011-09-13 NOTE — Telephone Encounter (Signed)
Pt called requesting results of Xray

## 2011-09-13 NOTE — Telephone Encounter (Signed)
Notified pt with md response...09/13/11@3 :44pm/LMB

## 2011-09-21 ENCOUNTER — Ambulatory Visit: Payer: 59

## 2011-09-21 ENCOUNTER — Ambulatory Visit (INDEPENDENT_AMBULATORY_CARE_PROVIDER_SITE_OTHER): Payer: 59 | Admitting: Family Medicine

## 2011-09-21 VITALS — BP 134/82 | HR 77 | Temp 98.9°F | Resp 16 | Ht 62.0 in | Wt 144.0 lb

## 2011-09-21 DIAGNOSIS — S93409A Sprain of unspecified ligament of unspecified ankle, initial encounter: Secondary | ICD-10-CM

## 2011-09-21 DIAGNOSIS — M25579 Pain in unspecified ankle and joints of unspecified foot: Secondary | ICD-10-CM

## 2011-09-21 DIAGNOSIS — M25572 Pain in left ankle and joints of left foot: Secondary | ICD-10-CM

## 2011-09-21 MED ORDER — MELOXICAM 7.5 MG PO TABS: 7.5000 mg | ORAL_TABLET | Freq: Two times a day (BID) | ORAL | Status: DC | PRN

## 2011-09-21 NOTE — Progress Notes (Signed)
  Subjective:    Patient ID: Susan Terrell, female    DOB: [birth year], 56 y.o.   MRN: [id number]  HPI Twisted (L) ankle earlier today. Has since developed pain and swelling when ambulating.  History of prior sprains to (L) ankle.   Review of Systems     Objective:   Physical Exam  Constitutional: She appears well-developed and well-nourished.  Cardiovascular: Normal rate, regular rhythm and normal heart sounds.   Pulmonary/Chest: Effort normal and breath sounds normal.  Musculoskeletal:       Left ankle: She exhibits swelling (Lateral maleolus). She exhibits normal range of motion and no deformity. tenderness. Lateral malleolus tenderness found.       Feet:    [facility] reading (PRIMARY) by  Dr. Hal Hope No fracture or dislocation. .     Assessment & Plan:  (L) Ankle Sprain  P/ Sweedo ankle stabilizer      Mobic 7.5mg  BID prn

## 2011-10-04 LAB — HM DEXA SCAN

## 2011-10-04 LAB — HM PAP SMEAR: HM Pap smear: NORMAL

## 2011-10-04 LAB — HM MAMMOGRAPHY: HM Mammogram: NORMAL

## 2011-10-21 ENCOUNTER — Ambulatory Visit (INDEPENDENT_AMBULATORY_CARE_PROVIDER_SITE_OTHER): Payer: 59 | Admitting: Internal Medicine

## 2011-10-21 ENCOUNTER — Encounter: Payer: Self-pay | Admitting: Gastroenterology

## 2011-10-21 ENCOUNTER — Encounter: Payer: Self-pay | Admitting: Internal Medicine

## 2011-10-21 ENCOUNTER — Other Ambulatory Visit (INDEPENDENT_AMBULATORY_CARE_PROVIDER_SITE_OTHER): Payer: 59

## 2011-10-21 VITALS — BP 118/72 | HR 61 | Temp 98.0°F | Resp 14 | Ht 62.25 in | Wt 142.8 lb

## 2011-10-21 DIAGNOSIS — R1012 Left upper quadrant pain: Secondary | ICD-10-CM | POA: Insufficient documentation

## 2011-10-21 DIAGNOSIS — E7800 Pure hypercholesterolemia, unspecified: Secondary | ICD-10-CM | POA: Insufficient documentation

## 2011-10-21 DIAGNOSIS — K921 Melena: Secondary | ICD-10-CM | POA: Insufficient documentation

## 2011-10-21 DIAGNOSIS — R7309 Other abnormal glucose: Secondary | ICD-10-CM

## 2011-10-21 DIAGNOSIS — R10812 Left upper quadrant abdominal tenderness: Secondary | ICD-10-CM

## 2011-10-21 LAB — CBC WITH DIFFERENTIAL/PLATELET
Basophils Absolute: 0 10*3/uL (ref 0.0–0.1)
Eosinophils Absolute: 0.1 10*3/uL (ref 0.0–0.7)
Hemoglobin: 14 g/dL (ref 12.0–15.0)
Lymphocytes Relative: 26.1 % (ref 12.0–46.0)
Lymphs Abs: 2 10*3/uL (ref 0.7–4.0)
MCHC: 33.4 g/dL (ref 30.0–36.0)
Monocytes Relative: 8.9 % (ref 3.0–12.0)
Neutro Abs: 4.7 10*3/uL (ref 1.4–7.7)
Platelets: 219 10*3/uL (ref 150.0–400.0)
RDW: 13.6 % (ref 11.5–14.6)

## 2011-10-21 LAB — URINALYSIS, ROUTINE W REFLEX MICROSCOPIC
Bilirubin Urine: NEGATIVE
Ketones, ur: 15
Nitrite: NEGATIVE
Specific Gravity, Urine: >=1.03 (ref 1.000–1.030)
Urobilinogen, UA: 0.2 (ref 0.0–1.0)

## 2011-10-21 LAB — COMPREHENSIVE METABOLIC PANEL
Albumin: 4.1 g/dL (ref 3.5–5.2)
BUN: 20 mg/dL (ref 6–23)
CO2: 28 mEq/L (ref 19–32)
Calcium: 8.9 mg/dL (ref 8.4–10.5)
Chloride: 100 mEq/L (ref 96–112)
Creatinine, Ser: 0.7 mg/dL (ref 0.4–1.2)
GFR: 94.57 mL/min (ref >60.00)
Potassium: 3.6 mEq/L (ref 3.5–5.1)

## 2011-10-21 LAB — LIPID PANEL
HDL: 63.1 mg/dL (ref >39.00)
Total CHOL/HDL Ratio: 3
Triglycerides: 61 mg/dL (ref 0.0–149.0)
VLDL: 12.2 mg/dL (ref 0.0–40.0)

## 2011-10-21 LAB — AMYLASE: Amylase: 134 U/L — ABNORMAL HIGH (ref 27–131)

## 2011-10-21 LAB — HEMOGLOBIN A1C: Hgb A1c MFr Bld: 5.5 % (ref 4.6–6.5)

## 2011-10-21 MED ORDER — EPINEPHRINE 0.3 MG/0.3ML IJ DEVI: 0.3000 mg | Freq: Once | INTRAMUSCULAR | Status: DC

## 2011-10-21 NOTE — Assessment & Plan Note (Signed)
FLP today 

## 2011-10-21 NOTE — Assessment & Plan Note (Signed)
Check her a1c today to screen for DM II

## 2011-10-21 NOTE — Assessment & Plan Note (Signed)
I think she may have a flare up of IBS, I have ordered labs today to look for other causes as well

## 2011-10-21 NOTE — Progress Notes (Signed)
  Subjective:    Patient ID: Susan Terrell, female    DOB: 26-Sep-1955, 56 y.o.   MRN: [id number]  Abdominal Pain This is a recurrent problem. Episode onset: 6 months ago. The onset quality is gradual. The problem occurs intermittently. The most recent episode lasted 6 months. The problem has been unchanged. The pain is located in the LUQ. The pain is at a severity of 2/10. The pain is mild. The quality of the pain is aching and cramping. The abdominal pain does not radiate. Associated symptoms include hematochezia. Pertinent negatives include no anorexia, arthralgias, belching, constipation, diarrhea, dysuria, fever, flatus, frequency, headaches, hematuria, melena, myalgias, nausea, vomiting or weight loss. The pain is aggravated by nothing. The pain is relieved by nothing. She has tried nothing for the symptoms.      Review of Systems  Constitutional: Negative for fever, chills, weight loss, activity change, appetite change, fatigue and unexpected weight change.  HENT: Negative.   Eyes: Negative.   Respiratory: Negative for apnea, cough, choking, chest tightness, shortness of breath, wheezing and stridor.   Gastrointestinal: Positive for abdominal pain, blood in stool, hematochezia and anal bleeding. Negative for nausea, vomiting, diarrhea, constipation, melena, abdominal distention, rectal pain, anorexia and flatus.  Genitourinary: Negative for dysuria, urgency, frequency, hematuria, flank pain, decreased urine volume, vaginal bleeding, vaginal discharge, enuresis, difficulty urinating, genital sores, vaginal pain, menstrual problem, pelvic pain and dyspareunia.  Musculoskeletal: Negative for myalgias, back pain, joint swelling, arthralgias and gait problem.  Skin: Negative for color change, pallor, rash and wound.  Neurological: Negative for dizziness, tremors, seizures, syncope, facial asymmetry, speech difficulty, weakness, light-headedness, numbness and headaches.  Hematological: Negative for  adenopathy. Does not bruise/bleed easily.  Psychiatric/Behavioral: Negative.        Objective:   Physical Exam  Vitals reviewed. Constitutional: She is oriented to person, place, and time. She appears well-developed and well-nourished. No distress.  HENT:  Head: Normocephalic and atraumatic.  Mouth/Throat: Oropharynx is clear and moist. No oropharyngeal exudate.  Eyes: Conjunctivae are normal. Right eye exhibits no discharge. Left eye exhibits no discharge. No scleral icterus.  Neck: Normal range of motion. Neck supple. No JVD present. No tracheal deviation present. No thyromegaly present.  Cardiovascular: Normal rate, regular rhythm, normal heart sounds and intact distal pulses.  Exam reveals no gallop and no friction rub.   No murmur heard. Pulmonary/Chest: Effort normal and breath sounds normal. No stridor. No respiratory distress. She has no wheezes. She has no rales. She exhibits no tenderness.  Abdominal: Soft. Bowel sounds are normal. She exhibits no distension and no mass. There is no tenderness. There is no rebound and no guarding.  Musculoskeletal: Normal range of motion. She exhibits no edema and no tenderness.  Lymphadenopathy:    She has no cervical adenopathy.  Neurological: She is oriented to person, place, and time.  Skin: Skin is warm and dry. No rash noted. She is not diaphoretic. No erythema. No pallor.  Psychiatric: She has a normal mood and affect. Her behavior is normal. Judgment and thought content normal.          Assessment & Plan:

## 2011-10-21 NOTE — Assessment & Plan Note (Signed)
GI referral

## 2011-10-21 NOTE — Patient Instructions (Signed)

## 2011-10-23 ENCOUNTER — Encounter: Payer: Self-pay | Admitting: Internal Medicine

## 2011-11-08 ENCOUNTER — Encounter: Payer: Self-pay | Admitting: Gastroenterology

## 2011-11-08 ENCOUNTER — Ambulatory Visit (INDEPENDENT_AMBULATORY_CARE_PROVIDER_SITE_OTHER): Payer: 59 | Admitting: Gastroenterology

## 2011-11-08 DIAGNOSIS — K921 Melena: Secondary | ICD-10-CM

## 2011-11-08 DIAGNOSIS — R109 Unspecified abdominal pain: Secondary | ICD-10-CM

## 2011-11-08 MED ORDER — MOVIPREP 100 G PO SOLR: 1.0000 | ORAL | Status: DC

## 2011-11-08 NOTE — Progress Notes (Signed)
HPI: This is a   very pleasant 56 year old woman whom I am meeting for the first time today.  Has had intermittent left sided pain, bending over can cause.  Feels like a knot in LUQ, Straightening her body helps.   Has also had intermittent burning in epig with certain foods, sometimes with nausea.   Both these symptom have improved with changing diet.  Cut back on her hours at work  She has intentionally lost some weight very recently but over 2 years weight is up.  Has seen mild rectal bleeding.  Had flex sig with Dr. Juanda Chance MANY YEARS AGO.  Her father died of pancreatic cancer, so did other members of her dads family.   Recent bloodwork: CBC, complete metabolic profile were normal.  Amylase was very slightly elevated (134 and ULN is 131), lipase was normal. Triglycerides were normal.    Review of systems: Pertinent positive and negative review of systems were noted in the above HPI section. Complete review of systems was performed and was otherwise normal.    Past Medical History  Diagnosis Date  . IBS (irritable bowel syndrome) 2010  . Osteopenia 2013  . Anal fissure   . UTI (lower urinary tract infection)   . Pneumonia     Past Surgical History  Procedure Date  . Abdominal hysterectomy   . Breast surgery   . Cesarean section     Current Outpatient Prescriptions  Medication Sig Dispense Refill  . EPINEPHrine (EPIPEN 2-PAK) 0.3 mg/0.3 mL DEVI Inject 0.3 mLs (0.3 mg total) into the muscle once.  1 Device  3  . Vitamin D, Ergocalciferol, (DRISDOL) 50000 UNITS CAPS Take 50,000 Units by mouth every 7 (seven) days.        Allergies as of 11/08/2011 - Review Complete 11/08/2011  Allergen Reaction Noted  . Codeine  09/12/2011  . Iodine  09/24/2010  . Macrodantin  09/24/2010    Family History  Problem Relation Age of Onset  . Pancreatic cancer Father   . Heart attack Brother   . Heart disease Brother   . Colon cancer Mother   . Stroke Neg Hx   .  Diabetes Neg Hx   . Hypertension Neg Hx   . Hyperlipidemia Neg Hx     History   Social History  . Marital Status: Married    Spouse Name: N/A    Number of Children: N/A  . Years of Education: N/A   Occupational History  . Not on file.   Social History Main Topics  . Smoking status: Never Smoker   . Smokeless tobacco: Never Used  . Alcohol Use: No  . Drug Use: No  . Sexually Active: Yes    Birth Control/ Protection: Surgical   Other Topics Concern  . Not on file   Social History Narrative  . No narrative on file       Physical Exam: BP 110/60  Pulse 60  Ht 5\' 2"  (1.575 m)  Wt 141 lb (63.957 kg)  BMI 25.79 kg/m2 Constitutional: generally well-appearing Psychiatric: alert and oriented x3 Eyes: extraocular movements intact Mouth: oral pharynx moist, no lesions Neck: supple no lymphadenopathy Cardiovascular: heart regular rate and rhythm Lungs: clear to auscultation bilaterally Abdomen: soft, nontender, nondistended, no obvious ascites, no peritoneal signs, normal bowel sounds Extremities: no lower extremity edema bilaterally Skin: no lesions on visible extremities    Assessment and plan: 56 y.o. female with  intermittent rectal bleeding, epigastric burning, left upper quadrant positional pain, family  history of pancreatic cancer  First given her abdominal pains and her family history of pancreatic cancer think we should proceed with CT scan abdomen and pelvis. Also her amylase was very slightly elevated earlier this month. She has postprandial epigastric burning, seems GERD like. Perhaps gastritis. EGD will be set up.  She has minor intermittent rectal bleeding and has not had colonoscopy for colon cancer screening. We will do that at the same time as her upper endoscopy.

## 2011-11-08 NOTE — Patient Instructions (Addendum)
You will be set up for a CT scan of abdomen and pelvis with IV and oral contrast for abd pain, family history of pancreatic cancer.  You have been scheduled for a CT scan of the abdomen and pelvis at  [facility] ([address]---this is in the same building as [facility]).   You are scheduled on 11/10/11 at 230 pm. You should arrive 15 minutes prior to your appointment time for registration. Please follow the written instructions below on the day of your exam:  WARNING: IF YOU ARE ALLERGIC TO IODINE/X-RAY DYE, PLEASE NOTIFY RADIOLOGY IMMEDIATELY AT [phone]! YOU WILL BE GIVEN A 13 HOUR PREMEDICATION PREP.  1) Do not eat or drink anything after 1030 am  (4 hours prior to your test) 2) You have been given 2 bottles of oral contrast to drink. The solution may taste better if refrigerated, but do NOT add ice or any other liquid to this solution. Shake well before drinking.    Drink 1 bottle of contrast @ 1230 pm (2 hours prior to your exam)  Drink 1 bottle of contrast @ 130 pm  (1 hour prior to your exam)  You may take any medications as prescribed with a small amount of water except for the following: Metformin, Glucophage, Glucovance, Avandamet, Riomet, Fortamet, Actoplus Met, Janumet, Glumetza or Metaglip. The above medications must be held the day of the exam AND 48 hours after the exam.  The purpose of you drinking the oral contrast is to aid in the visualization of your intestinal tract. The contrast solution may cause some diarrhea. Before your exam is started, you will be given a small amount of fluid to drink. Depending on your individual set of symptoms, you may also receive an intravenous injection of x-ray contrast/dye. Plan on being at [facility] for 30 minutes or long, depending on the type of exam you are having performed.  If you have any questions regarding your exam or if you need to reschedule, you may call the CT department at [phone] between  the hours of 8:00 am and 5:00 pm, Monday-Friday.  ________________________________________________________________________    Bonita Quin will be set up for an upper endoscopy for abd pain, burning in epigastrium. You will be set up for a colonoscopy for minor rectal bleeding.

## 2011-11-10 ENCOUNTER — Other Ambulatory Visit: Payer: 59

## 2011-11-14 ENCOUNTER — Inpatient Hospital Stay: Admission: RE | Admit: 2011-11-14 | Payer: 59 | Source: Ambulatory Visit

## 2011-11-15 ENCOUNTER — Ambulatory Visit: Payer: 59 | Admitting: Internal Medicine

## 2011-11-29 ENCOUNTER — Encounter: Payer: 59 | Admitting: Gastroenterology

## 2012-03-29 ENCOUNTER — Ambulatory Visit (INDEPENDENT_AMBULATORY_CARE_PROVIDER_SITE_OTHER): Payer: 59 | Admitting: Physician Assistant

## 2012-03-29 VITALS — BP 110/78 | HR 89 | Temp 97.4°F | Resp 18 | Ht 62.0 in | Wt 146.0 lb

## 2012-03-29 DIAGNOSIS — T6391XA Toxic effect of contact with unspecified venomous animal, accidental (unintentional), initial encounter: Secondary | ICD-10-CM

## 2012-03-29 DIAGNOSIS — T63441A Toxic effect of venom of bees, accidental (unintentional), initial encounter: Secondary | ICD-10-CM

## 2012-03-29 MED ORDER — DIPHENHYDRAMINE HCL 25 MG PO CAPS
25.0000 mg | ORAL_CAPSULE | Freq: Once | ORAL | Status: AC
  Administered 2012-03-29: 25 mg via ORAL

## 2012-03-29 MED ORDER — NON FORMULARY
10.0000 mg | Freq: Once | Status: AC
  Administered 2012-03-29: 10 mg via ORAL

## 2012-03-29 NOTE — Progress Notes (Signed)
  Subjective:    Patient ID: Susan Terrell, female    DOB: May 03, 1956, 56 y.o.   MRN: [id number]  HPI Pt presents to clinic with bee sting to her L forearm that occurred within the last hour. She thinks it might have been a yellow jacket.  She is here emergently because 30 years ago she had a bee sting that caused her entire arm to immediately swell.  She has never had a bee sting after that one.  She has no SOB, breathing problems or CP.  She has an epi pen but has not used it with this sting.   Review of Systems  Respiratory: Negative for cough and shortness of breath.   Cardiovascular: Negative for chest pain.  Skin: Positive for wound.       Objective:   Physical Exam  Vitals reviewed. Constitutional: She is oriented to person, place, and time. She appears well-developed and well-nourished.  HENT:  Head: Normocephalic and atraumatic.  Right Ear: External ear normal.  Left Ear: External ear normal.  Mouth/Throat: Uvula is midline. No uvula swelling.       No tongue or mouth swelling.  Eyes: Conjunctivae are normal.  Cardiovascular: Normal rate, regular rhythm and normal heart sounds.  Exam reveals no gallop.   No murmur heard. Pulmonary/Chest: Effort normal and breath sounds normal. No respiratory distress. She has no wheezes.  Lymphadenopathy:    She has no cervical adenopathy.  Neurological: She is alert and oriented to person, place, and time.  Skin: Skin is warm and dry.       Wound on L forearm about 1cm without visible stinger.  Meat tenderizer put onto sting and she got relief from stinging.  Psychiatric: She has a normal mood and affect. Her behavior is normal. Judgment and thought content normal.          Assessment & Plan:   1. Bee sting  diphenhydrAMINE (BENADRYL) capsule 25 mg, NON FORMULARY 10 mg   Pt can do daily zyrtec.  Benadryl can be given q4h if she continues to have problems.  I do not expect pt to have problems with this sting.  D/w pt what to do if  she develops any problems.

## 2012-09-12 ENCOUNTER — Encounter (INDEPENDENT_AMBULATORY_CARE_PROVIDER_SITE_OTHER): Payer: 59 | Admitting: Ophthalmology

## 2012-09-12 DIAGNOSIS — H43819 Vitreous degeneration, unspecified eye: Secondary | ICD-10-CM

## 2012-09-12 DIAGNOSIS — H31009 Unspecified chorioretinal scars, unspecified eye: Secondary | ICD-10-CM

## 2012-09-12 DIAGNOSIS — H3530 Unspecified macular degeneration: Secondary | ICD-10-CM

## 2012-09-12 DIAGNOSIS — H2510 Age-related nuclear cataract, unspecified eye: Secondary | ICD-10-CM

## 2012-11-02 ENCOUNTER — Ambulatory Visit (INDEPENDENT_AMBULATORY_CARE_PROVIDER_SITE_OTHER): Payer: 59 | Admitting: Family Medicine

## 2012-11-02 VITALS — BP 135/90 | HR 84 | Temp 97.9°F | Resp 16 | Ht 61.75 in | Wt 145.0 lb

## 2012-11-02 DIAGNOSIS — K529 Noninfective gastroenteritis and colitis, unspecified: Secondary | ICD-10-CM

## 2012-11-02 DIAGNOSIS — R350 Frequency of micturition: Secondary | ICD-10-CM

## 2012-11-02 DIAGNOSIS — N390 Urinary tract infection, site not specified: Secondary | ICD-10-CM

## 2012-11-02 DIAGNOSIS — K5289 Other specified noninfective gastroenteritis and colitis: Secondary | ICD-10-CM

## 2012-11-02 LAB — POCT URINALYSIS DIPSTICK
Ketones, UA: 40
Nitrite, UA: NEGATIVE

## 2012-11-02 LAB — POCT UA - MICROSCOPIC ONLY
Casts, Ur, LPF, POC: NEGATIVE
Renal tubular cells: POSITIVE

## 2012-11-02 MED ORDER — SULFAMETHOXAZOLE-TRIMETHOPRIM 800-160 MG PO TABS: 1.0000 | ORAL_TABLET | Freq: Two times a day (BID) | ORAL | Status: DC

## 2012-11-02 NOTE — Progress Notes (Signed)
Patient ID: LEATHIE WEICH MRN: [id number], DOB: 12/31/1955, 57 y.o. Date of Encounter: 11/02/2012, 5:40 PM  Primary Physician: Sanda Linger, MD  Chief Complaint:  Chief Complaint  Patient presents with  . Diarrhea    X 3 days  . Abdominal Pain  . Facial Pain    X 2 weeks off and on  . Urinary Frequency    HPI: 57 y.o. year old female presents with 4 day history of chills followed by nausea, low energy and diarrhea.  She's also had dysuria, urgency, and frequency. Last UTI was couple years ago No hematuria  No sick contacts, recent antibiotics, or recent travels.   No vaginal discharge, back pain, fever  She has had pilonidal surgery in the past.  Past Medical History  Diagnosis Date  . IBS (irritable bowel syndrome) 2010  . Osteopenia 2013  . Anal fissure   . UTI (lower urinary tract infection)   . Pneumonia      Home Meds: Prior to Admission medications   Medication Sig Start Date End Date Taking? Authorizing Provider  EPINEPHrine (EPIPEN 2-PAK) 0.3 mg/0.3 mL DEVI Inject 0.3 mLs (0.3 mg total) into the muscle once. 10/21/11  Yes Etta Grandchild, MD    Allergies:  Allergies  Allergen Reactions  . Codeine   . Iodine   . Macrodantin     History   Social History  . Marital Status: Married    Spouse Name: N/A    Number of Children: N/A  . Years of Education: N/A   Occupational History  . Not on file.   Social History Main Topics  . Smoking status: Never Smoker   . Smokeless tobacco: Never Used  . Alcohol Use: No  . Drug Use: No  . Sexually Active: Yes    Birth Control/ Protection: Surgical   Other Topics Concern  . Not on file   Social History Narrative  . No narrative on file     Review of Systems: Constitutional: negative for chills, fever, night sweats or weight changes Cardiovascular: negative for chest pain or palpitations Respiratory: negative for hemoptysis, wheezing, or shortness of breath Abdominal: negative for abdominal pain,  nausea, vomiting or diarrhea Dermatological: negative for rash Neurologic: negative for headache   Physical Exam: Blood pressure 135/90, pulse 84, temperature 97.9 F (36.6 C), temperature source Oral, resp. rate 16, height 5' 1.75" (1.568 m), weight 145 lb (65.772 kg), SpO2 98.00%., Body mass index is 26.75 kg/(m^2). General: Well developed, well nourished, in no acute distress. Head: Normocephalic, atraumatic, eyes without discharge, sclera non-icteric, nares are congested. Bilateral auditory canals clear, TM's are without perforation, pearly grey with reflective cone of light bilaterally. Serous effusion bilaterally behind TM's. Maxillary sinus TTP. Oral cavity moist, dentition normal. Posterior pharynx with post nasal drip and mild erythema. No peritonsillar abscess or tonsillar exudate. Neck: Supple. No thyromegaly. Full ROM. No lymphadenopathy. Lungs: Coarse breath sounds bilaterally without Clear bilaterally to auscultation without wheezes, rales, or rhonchi. Breathing is unlabored.  Heart: RRR with S1 S2. No murmurs, rubs, or gallops appreciated. Abdomen: Soft, slightly tender left upper quadrant, non-distended with normoactive bowel sounds. No hepatosplenomegaly. No rebound/guarding. No obvious abdominal masses. McBurney's, Rovsing's, Iliopsoas, and table jar all negative. Msk:  Strength and tone normal for age. Extremities: No clubbing or cyanosis. No edema. Neuro: Alert and oriented X 3. Moves all extremities spontaneously. CNII-XII grossly in tact. Psych:  Responds to questions appropriately with a normal affect.   Labs: Results for orders placed  in visit on 11/02/12  POCT UA - MICROSCOPIC ONLY      Result Value Range   WBC, Ur, HPF, POC 6-22     RBC, urine, microscopic 3-16     Bacteria, U Microscopic 1+     Mucus, UA mod     Epithelial cells, urine per micros 2-6     Crystals, Ur, HPF, POC neg     Casts, Ur, LPF, POC neg     Yeast, UA neg     Renal tubular cells pos      POCT URINALYSIS DIPSTICK      Result Value Range   Color, UA yellow     Clarity, UA clear     Glucose, UA neg     Bilirubin, UA small     Ketones, UA 40     Spec Grav, UA 1.025     Blood, UA small     pH, UA 5.5     Protein, UA trace     Urobilinogen, UA 0.2     Nitrite, UA neg     Leukocytes, UA small (1+)         ASSESSMENT AND PLAN:  57 y.o. year old female with gastroenteritis and UTI Urinary frequency - Plan: POCT UA - Microscopic Only, POCT urinalysis dipstick, Urine culture, sulfamethoxazole-trimethoprim (BACTRIM DS,SEPTRA DS) 800-160 MG per tablet   - -Mucinex -Tylenol/Motrin prn -Rest/fluids -RTC precautions -RTC 3-5 days if no improvement  Signed, Elvina Sidle, MD 11/02/2012 5:40 PM

## 2012-11-03 LAB — URINE CULTURE: Colony Count: 2000

## 2013-08-10 IMAGING — CR DG CHEST 2V
2 series · 2 of 2 positions shown · non-contrast
Comparison: Chest CT 08/05/2003

CLINICAL DATA: Cough, pain.

CHEST - 2 VIEW

[view not recorded (1 of 2)]
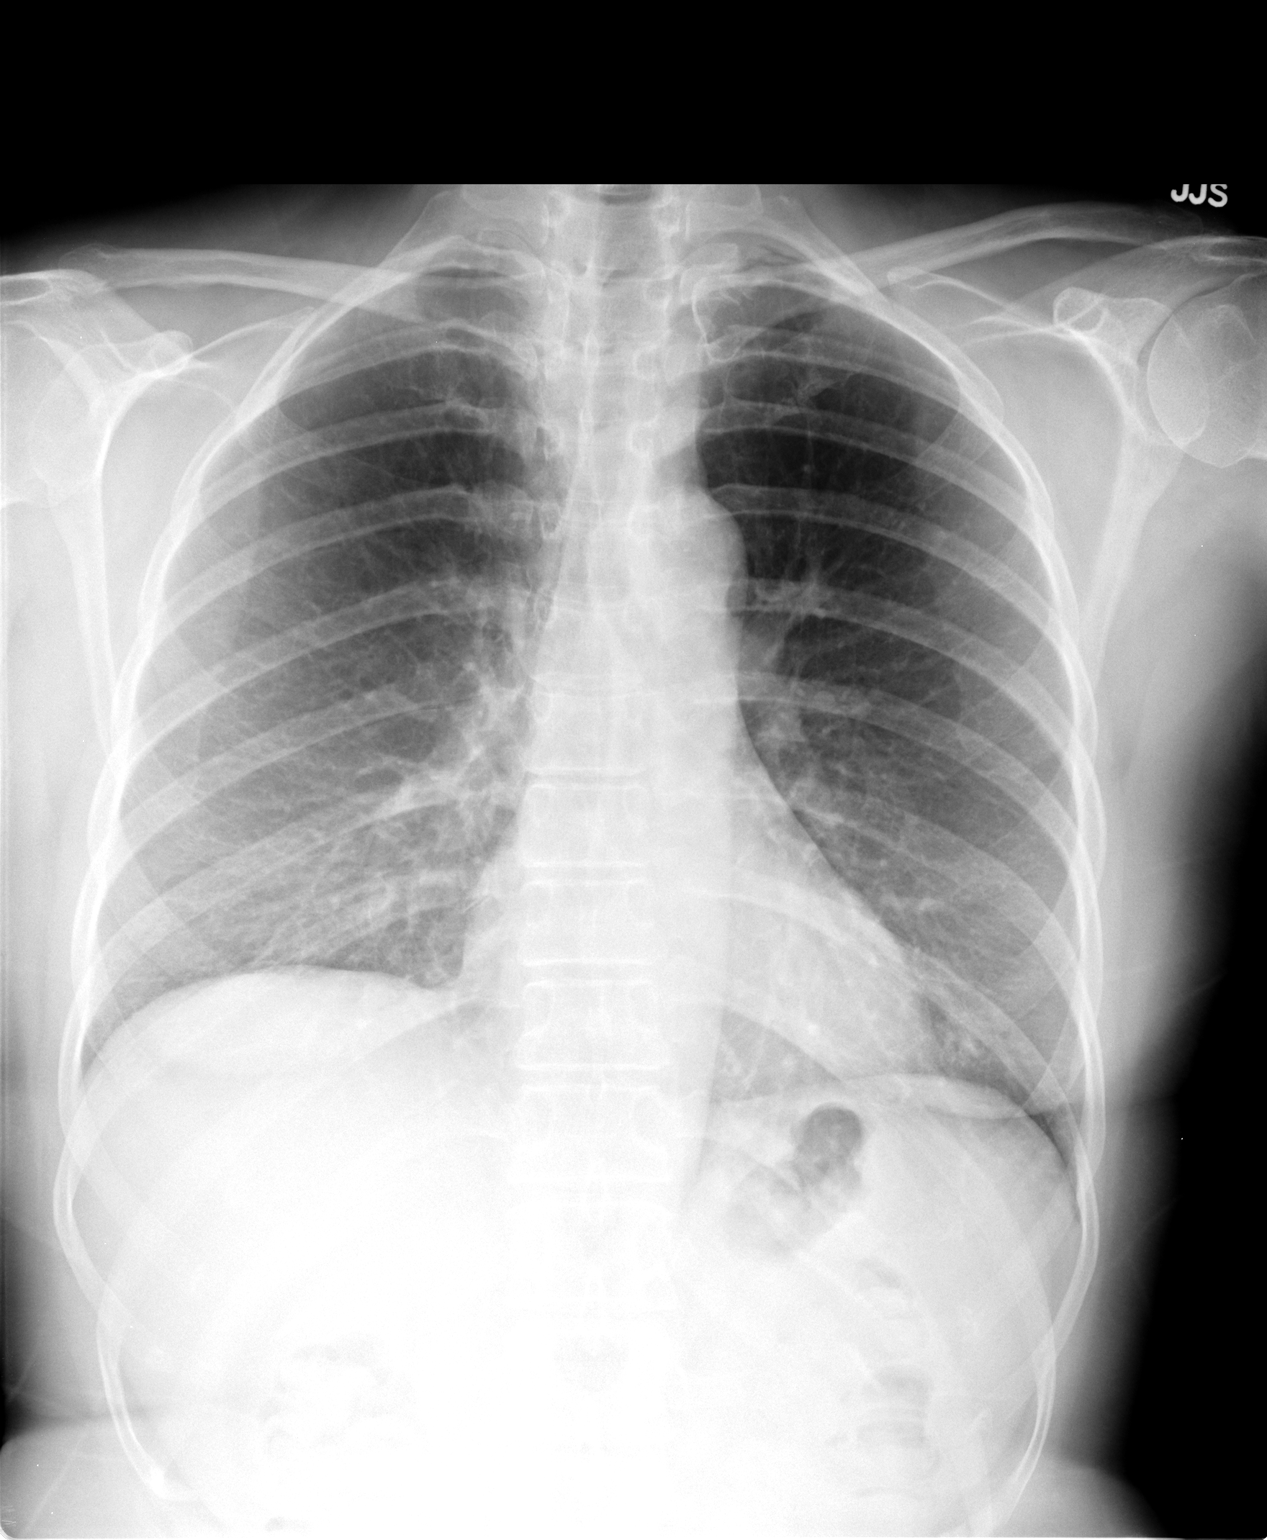

[view not recorded (2 of 2)]
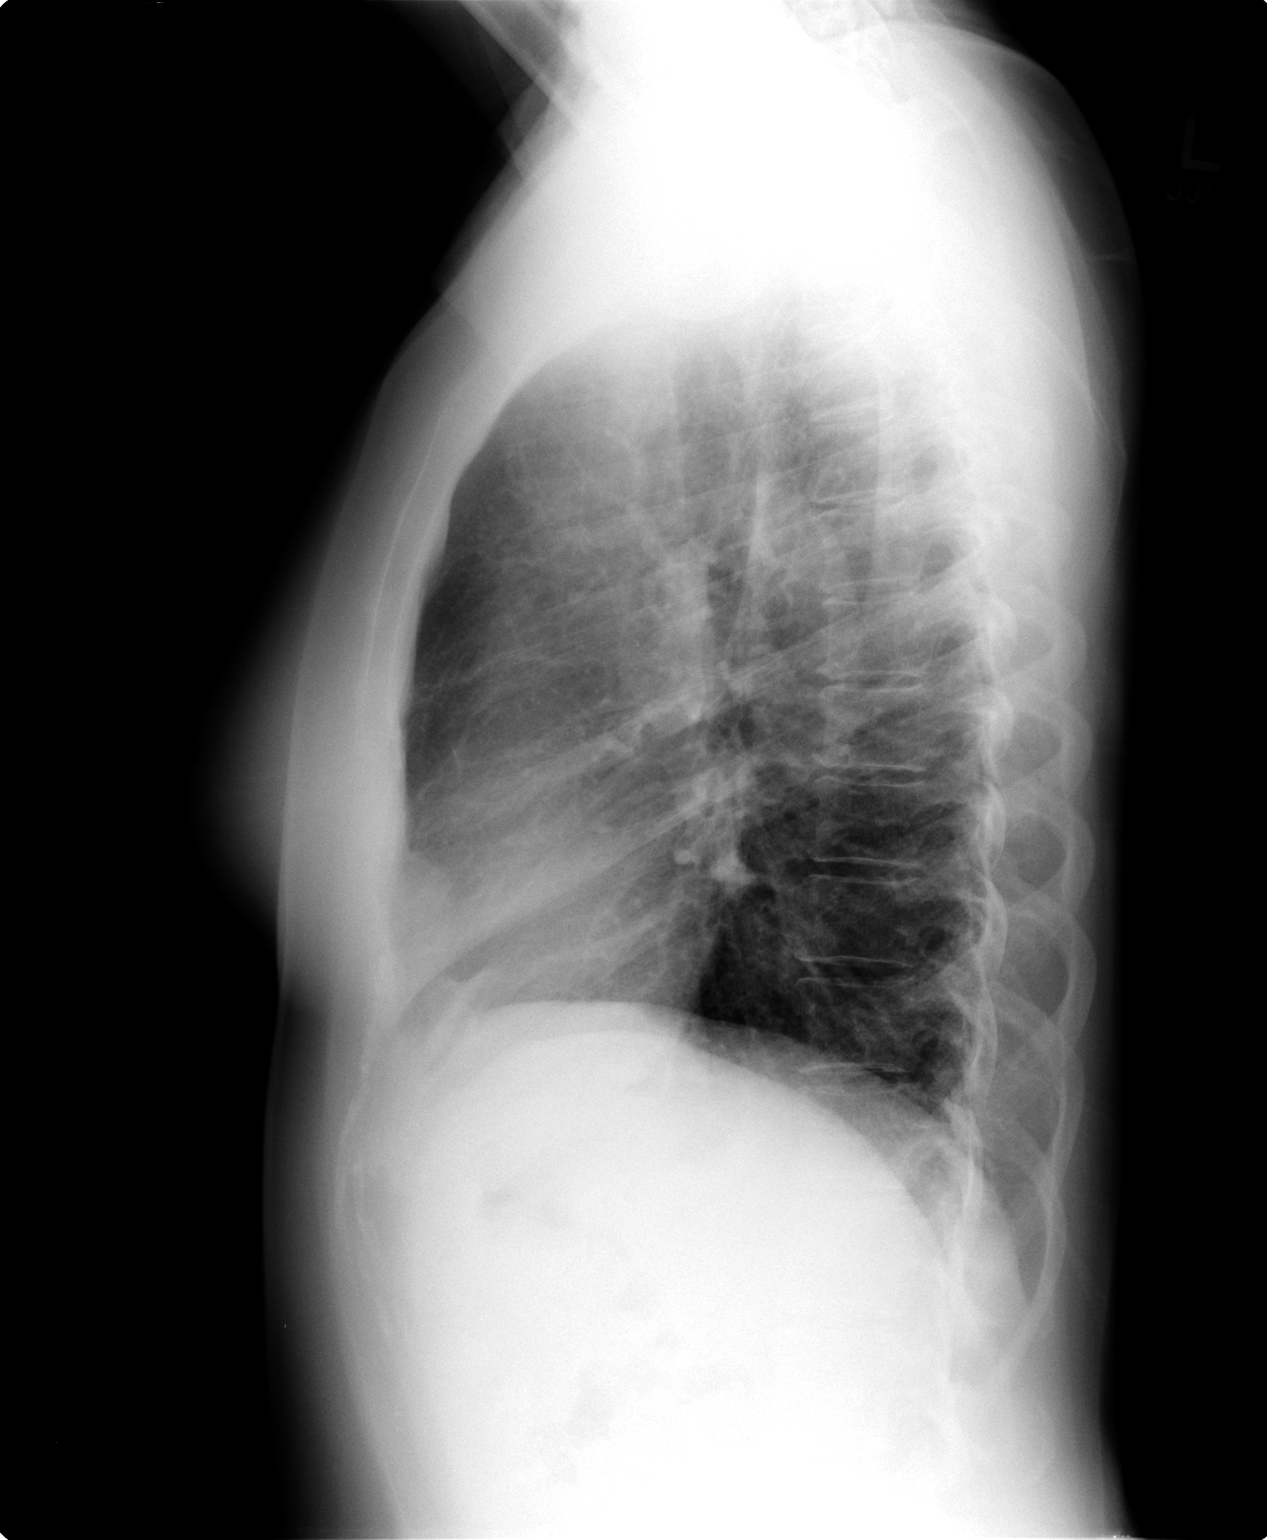

[2 of 2 positions shown; findings below may reference images not displayed]

FINDINGS: Heart and mediastinal contours are within normal limits.
No focal opacities or effusions.  No acute bony abnormality.
IMPRESSION: No active cardiopulmonary disease.

## 2013-08-20 ENCOUNTER — Ambulatory Visit (INDEPENDENT_AMBULATORY_CARE_PROVIDER_SITE_OTHER): Payer: 59 | Admitting: Family Medicine

## 2013-08-20 ENCOUNTER — Telehealth: Payer: Self-pay | Admitting: Internal Medicine

## 2013-08-20 VITALS — BP 148/70 | HR 87 | Temp 99.7°F | Resp 16 | Ht 63.5 in | Wt 152.0 lb

## 2013-08-20 DIAGNOSIS — R350 Frequency of micturition: Secondary | ICD-10-CM

## 2013-08-20 DIAGNOSIS — R6889 Other general symptoms and signs: Secondary | ICD-10-CM

## 2013-08-20 DIAGNOSIS — R8290 Unspecified abnormal findings in urine: Secondary | ICD-10-CM

## 2013-08-20 DIAGNOSIS — R82998 Other abnormal findings in urine: Secondary | ICD-10-CM

## 2013-08-20 DIAGNOSIS — N390 Urinary tract infection, site not specified: Secondary | ICD-10-CM

## 2013-08-20 LAB — POCT URINALYSIS DIPSTICK
Bilirubin, UA: NEGATIVE
Glucose, UA: NEGATIVE
Ketones, UA: 40
Nitrite, UA: NEGATIVE
PROTEIN UA: NEGATIVE
Spec Grav, UA: 1.025
UROBILINOGEN UA: 0.2
pH, UA: 5.5

## 2013-08-20 LAB — POCT INFLUENZA A/B
Influenza A, POC: NEGATIVE
Influenza B, POC: NEGATIVE

## 2013-08-20 LAB — POCT UA - MICROSCOPIC ONLY
Casts, Ur, LPF, POC: NEGATIVE
Crystals, Ur, HPF, POC: NEGATIVE
Yeast, UA: NEGATIVE

## 2013-08-20 MED ORDER — SULFAMETHOXAZOLE-TRIMETHOPRIM 800-160 MG PO TABS: 1.0000 | ORAL_TABLET | Freq: Two times a day (BID) | ORAL | Status: DC

## 2013-08-20 MED ORDER — OSELTAMIVIR PHOSPHATE 75 MG PO CAPS: 75.0000 mg | ORAL_CAPSULE | Freq: Every day | ORAL | Status: DC

## 2013-08-20 NOTE — Telephone Encounter (Signed)
yes

## 2013-08-20 NOTE — Telephone Encounter (Signed)
Patient would like to transfer from Dr. Yetta BarreJones to Dr. Abner GreenspanBlyth. Is this ok? Thanks!

## 2013-08-20 NOTE — Progress Notes (Signed)
Chief Complaint:  Chief Complaint  Patient presents with  . Cough    daughter had flu, sx's started last night  . Sore Throat  . Urinary Frequency    HPI: Susan Terrell is a 58 y.o. female who is here for : 1. Head cold sxs x 2 days, flu exposure by daughter, today she started aching. She was in the car with her daughter and her family going to and from from [place] world to [place]. Her face has felt warm amd she has aches and pains all over her body ad has had chills and subjective fever. SHe did not get flu vaccine this year.  2. She has freq urinary tract infections when she travels. She feels some incr fre and aburning and back pain.   Past Medical History  Diagnosis Date  . IBS (irritable bowel syndrome) 2010  . Osteopenia 2013  . Anal fissure   . UTI (lower urinary tract infection)   . Pneumonia    Past Surgical History  Procedure Laterality Date  . Abdominal hysterectomy    . Breast surgery    . Cesarean section     History   Social History  . Marital Status: Married    Spouse Name: N/A    Number of Children: N/A  . Years of Education: N/A   Social History Main Topics  . Smoking status: Never Smoker   . Smokeless tobacco: Never Used  . Alcohol Use: No  . Drug Use: No  . Sexual Activity: Yes    Birth Control/ Protection: Surgical   Other Topics Concern  . None   Social History Narrative  . None   Family History  Problem Relation Age of Onset  . Pancreatic cancer Father   . Heart attack Brother   . Heart disease Brother   . Colon cancer Mother   . Stroke Neg Hx   . Diabetes Neg Hx   . Hypertension Neg Hx   . Hyperlipidemia Neg Hx    Allergies  Allergen Reactions  . Codeine   . Iodine   . Macrodantin    Prior to Admission medications   Medication Sig Start Date End Date Taking? Authorizing Provider  EPINEPHrine (EPIPEN 2-PAK) 0.3 mg/0.3 mL DEVI Inject 0.3 mLs (0.3 mg total) into the muscle once. 10/21/11   Etta Grandchild, MD    sulfamethoxazole-trimethoprim (BACTRIM DS,SEPTRA DS) 800-160 MG per tablet Take 1 tablet by mouth 2 (two) times daily. 11/02/12   Elvina Sidle, MD     ROS: The patient denies  night sweats, unintentional weight loss, chest pain, palpitations, wheezing, dyspnea on exertion, nausea, vomiting, abdominal pain, melena, numbness, weakness, or tingling.   All other systems have been reviewed and were otherwise negative with the exception of those mentioned in the HPI and as above.    PHYSICAL EXAM: Filed Vitals:   08/20/13 1753  BP: 148/70  Pulse: 87  Temp: 99.7 F (37.6 C)  Resp: 16   Filed Vitals:   08/20/13 1753  Height: 5' 3.5" (1.613 m)  Weight: 152 lb (68.947 kg)   Body mass index is 26.5 kg/(m^2).  General: Alert, no acute distress HEENT:  Normocephalic, atraumatic, oropharynx patent. EOMI, PERRLA, no sinus tenderness, Tm nl, no exudates Cardiovascular:  Regular rate and rhythm, no rubs murmurs or gallops.  No Carotid bruits, radial pulse intact. No pedal edema.  Respiratory: Clear to auscultation bilaterally.  No wheezes, rales, or rhonchi.  No cyanosis, no use of  accessory musculature GI: No organomegaly, abdomen is soft and non-tender, positive bowel sounds.  No masses. Skin: No rashes. Neurologic: Facial musculature symmetric. Psychiatric: Patient is appropriate throughout our interaction. Lymphatic: No cervical lymphadenopathy Musculoskeletal: Gait intact. No CVA tenderness, 5/5 strength UE and Roi Jafari   LABS: Results for orders placed in visit on 08/20/13  POCT UA - MICROSCOPIC ONLY      Result Value Range   WBC, Ur, HPF, POC 0-2     RBC, urine, microscopic 3-8     Bacteria, U Microscopic small     Mucus, UA small     Epithelial cells, urine per micros 2-5     Crystals, Ur, HPF, POC neg     Casts, Ur, LPF, POC neg     Yeast, UA neg    POCT URINALYSIS DIPSTICK      Result Value Range   Color, UA yellow     Clarity, UA cler     Glucose, UA neg     Bilirubin, UA  neg     Ketones, UA 40     Spec Grav, UA 1.025     Blood, UA small     pH, UA 5.5     Protein, UA neg     Urobilinogen, UA 0.2     Nitrite, UA neg     Leukocytes, UA Trace    POCT INFLUENZA A/B      Result Value Range   Influenza A, POC Negative     Influenza B, POC Negative       EKG/XRAY:   Primary read interpreted [facility]   ASSESSMENT/PLAN: Encounter Diagnoses  Name Primary?  . Urinary frequency Yes  . Flu-like symptoms    Flu like sxs, most likely viral but she did not get flu vaccine and has had close contact with daughter [place] [facility] [place]. Will give Tamilfu 75 mg daily for ppx She has UTI sxs with hematuria and trace leks on urine dip/mircor with Bactrim DS and cx F/u prn  Gross sideeffects, risk and benefits, and alternatives of medications d/w patient. Patient is aware that all medications have potential sideeffects and we are unable to predict every sideeffect or drug-drug interaction that may occur.  Hamilton CapriLE, Teya Otterson PHUONG, DO 08/20/2013 7:13 PM

## 2013-08-20 NOTE — Telephone Encounter (Signed)
OK 

## 2013-08-21 NOTE — Telephone Encounter (Signed)
Spoke to patient and scheduled her with Dr. Abner GreenspanBlyth for 11/07/13

## 2013-08-22 LAB — URINE CULTURE: Colony Count: >=100000

## 2013-08-23 ENCOUNTER — Telehealth: Payer: Self-pay | Admitting: Family Medicine

## 2013-08-23 NOTE — Telephone Encounter (Signed)
Spoke with pt about urine cx, she cancontinue with bactrim for 5 instead of 7 days since did not grow out anything specific

## 2013-11-07 ENCOUNTER — Other Ambulatory Visit: Payer: Self-pay | Admitting: Family Medicine

## 2013-11-07 ENCOUNTER — Ambulatory Visit (INDEPENDENT_AMBULATORY_CARE_PROVIDER_SITE_OTHER): Payer: 59 | Admitting: Family Medicine

## 2013-11-07 ENCOUNTER — Encounter: Payer: Self-pay | Admitting: Family Medicine

## 2013-11-07 VITALS — BP 150/84 | HR 60 | Temp 97.8°F | Ht 62.0 in | Wt 154.0 lb

## 2013-11-07 DIAGNOSIS — E7800 Pure hypercholesterolemia, unspecified: Secondary | ICD-10-CM

## 2013-11-07 DIAGNOSIS — IMO0001 Reserved for inherently not codable concepts without codable children: Secondary | ICD-10-CM

## 2013-11-07 DIAGNOSIS — Z1211 Encounter for screening for malignant neoplasm of colon: Secondary | ICD-10-CM

## 2013-11-07 DIAGNOSIS — R030 Elevated blood-pressure reading, without diagnosis of hypertension: Secondary | ICD-10-CM

## 2013-11-07 DIAGNOSIS — N390 Urinary tract infection, site not specified: Secondary | ICD-10-CM

## 2013-11-07 DIAGNOSIS — E785 Hyperlipidemia, unspecified: Secondary | ICD-10-CM

## 2013-11-07 DIAGNOSIS — R7309 Other abnormal glucose: Secondary | ICD-10-CM

## 2013-11-07 DIAGNOSIS — J45909 Unspecified asthma, uncomplicated: Secondary | ICD-10-CM | POA: Insufficient documentation

## 2013-11-07 LAB — RENAL FUNCTION PANEL
Albumin: 4.1 g/dL (ref 3.5–5.2)
BUN: 16 mg/dL (ref 6–23)
CHLORIDE: 104 meq/L (ref 96–112)
CO2: 25 mEq/L (ref 19–32)
Calcium: 8.9 mg/dL (ref 8.4–10.5)
Creat: 0.7 mg/dL (ref 0.50–1.10)
GLUCOSE: 85 mg/dL (ref 70–99)
PHOSPHORUS: 3.3 mg/dL (ref 2.3–4.6)
POTASSIUM: 3.7 meq/L (ref 3.5–5.3)
Sodium: 140 mEq/L (ref 135–145)

## 2013-11-07 LAB — HEPATIC FUNCTION PANEL
ALT: 27 U/L (ref 0–35)
AST: 25 U/L (ref 0–37)
Albumin: 4.1 g/dL (ref 3.5–5.2)
Alkaline Phosphatase: 78 U/L (ref 39–117)
BILIRUBIN INDIRECT: 0.3 mg/dL (ref 0.2–1.2)
Bilirubin, Direct: 0.1 mg/dL (ref 0.0–0.3)
Total Bilirubin: 0.4 mg/dL (ref 0.2–1.2)
Total Protein: 7 g/dL (ref 6.0–8.3)

## 2013-11-07 LAB — CBC
HCT: 42.6 % (ref 36.0–46.0)
HEMOGLOBIN: 14.3 g/dL (ref 12.0–15.0)
MCH: 30 pg (ref 26.0–34.0)
MCHC: 33.6 g/dL (ref 30.0–36.0)
MCV: 89.3 fL (ref 78.0–100.0)
PLATELETS: 276 10*3/uL (ref 150–400)
RBC: 4.77 MIL/uL (ref 3.87–5.11)
RDW: 14.2 % (ref 11.5–15.5)
WBC: 7.9 10*3/uL (ref 4.0–10.5)

## 2013-11-07 LAB — LIPID PANEL
CHOL/HDL RATIO: 3.1 ratio
Cholesterol: 177 mg/dL (ref 0–200)
HDL: 57 mg/dL (ref >39)
LDL Cholesterol: 109 mg/dL — ABNORMAL HIGH (ref 0–99)
TRIGLYCERIDES: 57 mg/dL (ref <150)
VLDL: 11 mg/dL (ref 0–40)

## 2013-11-07 NOTE — Patient Instructions (Signed)
Zinc daily 50 mg daily Probiotic daily such as Digestive Advantage.  Interstitial Cystitis Interstitial cystitis (IC) is a condition that results in discomfort or pain in the bladder and the surrounding pelvic region. The symptoms can be different from case to case and even in the same individual. People may experience:  Mild discomfort.  Pressure.  Tenderness.  Intense pain in the bladder and pelvic area. CAUSES  Because IC varies so much in symptoms and severity, people studying this disease believe it is not one but several diseases. Some caregivers use the term painful bladder syndrome (PBS) to describe cases with painful urinary symptoms. This may not meet the strictest definition of IC. The term IC / PBS includes all cases of urinary pain that cannot be connected to other causes, such as infection or urinary stones.  SYMPTOMS  Symptoms may include:  An urgent need to urinate.  A frequent need to urinate.  A combination of these symptoms. Pain may change in intensity as the bladder fills with urine or as it empties. Women's symptoms often get worse during menstruation. They may sometimes experience pain with vaginal intercourse. Some of the symptoms of IC / PBS seem like those of bacterial infection. Tests do not show infection. IC / PBS is far more common in women than in men.  DIAGNOSIS  The diagnosis of IC / PBS is based on:  Presence of pain related to the bladder, usually along with problems of frequency and urgency.  Not finding other diseases that could cause the symptoms.  Diagnostic tests that help rule out other diseases include:  Urinalysis.  Urine culture.  Cystoscopy.  Biopsy of the bladder wall.  Distension of the bladder under anesthesia.  Urine cytology.  Laboratory examination of prostate secretions. A biopsy is a tissue sample that can be looked at under a microscope. Samples of the bladder and urethra may be removed during a cystoscopy. A biopsy  helps rule out bladder cancer. TREATMENT  Scientists have not yet found a cure for IC / PBS. Patients with IC / PBS do not get better with antibiotic therapy. Caregivers cannot predict who will respond best to which treatment. Symptoms may disappear without explanation. Disappearing symptoms may coincide with an event such as a change in diet or treatment. Even when symptoms disappear, they may return after days, weeks, months, or years.  Because the causes of IC / PBS are unknown, current treatments are aimed at relieving symptoms. Many people are helped by one or a combination of the treatments. As researchers learn more about IC / PBS, the list of potential treatments will change. Patients should discuss their options with a caregiver. SURGERY  Surgery should be considered only if all available treatments have failed and the pain is disabling. Many approaches and techniques are used. Each approach has its own advantages and complications. Advantages and complications should be discussed with a urologist. Your caregiver may recommend consulting another urologist for a second opinion. Most caregivers are reluctant to operate because the outcome is unpredictable. Some people still have symptoms after surgery.  People considering surgery should discuss the potential risks and benefits, side effects, and long- and short-term complications with their family, as well as with people who have already had the procedure. Surgery requires anesthesia, hospitalization, and in some cases weeks or months of recovery. As the complexity of the procedure increases, so do the chances for complications and for failure. HOME CARE INSTRUCTIONS   All drugs, even those sold over the counter,  have side effects. Patients should always consult a caregiver before using any drug for an extended amount of time. Only take over-the-counter or prescription medicines for pain, discomfort, or fever as directed by your caregiver.  Many  patients feel that smoking makes their symptoms worse. How the by-products of tobacco that are excreted in the urine affect IC / PBS is unknown. Smoking is the major known cause of bladder cancer. One of the best things smokers can do for their bladder and their overall health is to quit.  Many patients feel that gentle stretching exercises help relieve IC / PBS symptoms.  Methods vary, but basically patients decide to empty their bladder at designated times and use relaxation techniques and distractions to keep to the schedule. Gradually, patients try to lengthen the time between scheduled voids. A diary in which to record voiding times is usually helpful in keeping track of progress. MAKE SURE YOU:   Understand these instructions.  Will watch your condition.  Will get help right away if you are not doing well or get worse. Document Released: 04/01/2004 Document Revised: 10/24/2011 Document Reviewed: 06/16/2008 [facility] Patient Information 2014 [place], [place].

## 2013-11-07 NOTE — Progress Notes (Signed)
Pre visit review using our clinic review tool, if applicable. No additional management support is needed unless otherwise documented below in the visit note. 

## 2013-11-08 ENCOUNTER — Telehealth: Payer: Self-pay | Admitting: Family Medicine

## 2013-11-08 LAB — URINALYSIS
Bilirubin Urine: NEGATIVE
Glucose, UA: NEGATIVE mg/dL
HGB URINE DIPSTICK: NEGATIVE
Ketones, ur: NEGATIVE mg/dL
NITRITE: NEGATIVE
Protein, ur: NEGATIVE mg/dL
Specific Gravity, Urine: 1.007 (ref 1.005–1.030)
Urobilinogen, UA: 0.2 mg/dL (ref 0.0–1.0)
pH: 5 (ref 5.0–8.0)

## 2013-11-08 LAB — URINE CULTURE
Colony Count: NO GROWTH
Organism ID, Bacteria: NO GROWTH

## 2013-11-08 LAB — TSH: TSH: 7.909 u[IU]/mL — ABNORMAL HIGH (ref 0.350–4.500)

## 2013-11-08 NOTE — Telephone Encounter (Signed)
I think it is too late and she needs a payable diagnosis or insurance will not cover (osteoporosis, abnormal calcium) without those she will likely get a bill. If that is OK with her I can order a vitamin d for her to come have checked

## 2013-11-08 NOTE — Telephone Encounter (Signed)
Please advise 

## 2013-11-08 NOTE — Telephone Encounter (Signed)
Patient was seen yesterday and had labs drawn. She wants to know if it is too late to add Vit D to that lab?

## 2013-11-10 ENCOUNTER — Encounter: Payer: Self-pay | Admitting: Family Medicine

## 2013-11-10 DIAGNOSIS — I10 Essential (primary) hypertension: Secondary | ICD-10-CM | POA: Insufficient documentation

## 2013-11-10 DIAGNOSIS — IMO0001 Reserved for inherently not codable concepts without codable children: Secondary | ICD-10-CM

## 2013-11-10 DIAGNOSIS — N390 Urinary tract infection, site not specified: Secondary | ICD-10-CM | POA: Insufficient documentation

## 2013-11-10 HISTORY — DX: Reserved for inherently not codable concepts without codable children: IMO0001

## 2013-11-10 NOTE — Assessment & Plan Note (Signed)
Normal on labwork 

## 2013-11-10 NOTE — Assessment & Plan Note (Signed)
Encouraged heart healthy diet such as the DASH diet and exercise as tolerated.  

## 2013-11-10 NOTE — Assessment & Plan Note (Signed)
Vs interstitial cystitis. Encouraged adequate hydration and probiotics

## 2013-11-10 NOTE — Assessment & Plan Note (Signed)
Mild. Encouraged heart healthy diet, increase exercise, avoid trans fats, consider a krill oil cap daily 

## 2013-11-10 NOTE — Progress Notes (Signed)
Patient ID: Susan Terrell, female   DOB: 07-25-1956, 58 y.o.   MRN: [id number] DANILA EDDIE [id number] [birth year] 11/10/2013      Progress Note-Follow Up  Subjective  Chief Complaint  No chief complaint on file.   HPI  Patient is a 58 year old female in today for routine medical care. She is establishing care with our office. She generally feels well. She does note some urinary frequency and occasional dysuria recently. Has had urinary tract infections in the past and questions whether she could have interstitial cystitis. No other recent illness. Denies abdominal pain or back pain. Denies CP/palp/SOB/HA/congestion/fevers/GI or GU c/o. Taking meds as prescribed  Past Medical History  Diagnosis Date  . IBS (irritable bowel syndrome) 2010  . Osteopenia 2013  . Anal fissure   . UTI (lower urinary tract infection)   . Pneumonia   . Chicken pox as a child  . Measles as a child  . Mumps as a child  . Asthma     childhood?  . Elevated BP 11/10/2013    Past Surgical History  Procedure Laterality Date  . Abdominal hysterectomy    . Pilonidal cyst excision    . Incise and drain abcess      right groin  . Breast surgery      left needle biopsy    Family History  Problem Relation Age of Onset  . Cancer Father     pancreatic   . Heart attack Brother   . Heart disease Brother     MI  . Cancer Mother     colon  . Hyperlipidemia Mother   . Glaucoma Mother   . Thyroid disease Mother   . Hypertension Mother   . Cataracts Sister   . Asthma Daughter   . Hypertension Daughter   . Cataracts Maternal Grandmother   . Alcohol abuse Maternal Grandfather   . Hyperlipidemia Sister   . Glaucoma Sister   . Arthritis Sister     rheumatoid  . Glaucoma Brother   . Hyperlipidemia Brother   . Hypertension Brother   . Other Brother     carotid artery disease  . Colitis Daughter   . Hyperlipidemia Daughter   . Asthma Daughter     History   Social History  . Marital Status: Married     Spouse Name: N/A    Number of Children: N/A  . Years of Education: N/A   Occupational History  . Not on file.   Social History Main Topics  . Smoking status: Never Smoker   . Smokeless tobacco: Never Used  . Alcohol Use: No  . Drug Use: No  . Sexual Activity: Yes    Birth Control/ Protection: Surgical     Comment: lives with husband and daughter, no dietary restrictions.   Other Topics Concern  . Not on file   Social History Narrative  . No narrative on file    Current Outpatient Prescriptions on File Prior to Visit  Medication Sig Dispense Refill  . EPINEPHrine (EPIPEN 2-PAK) 0.3 mg/0.3 mL DEVI Inject 0.3 mLs (0.3 mg total) into the muscle once.  1 Device  3   No current facility-administered medications on file prior to visit.    Allergies  Allergen Reactions  . Bee Venom     Swelling localized  . Codeine   . Iodine   . Macrodantin     Review of Systems  Review of Systems  Constitutional: Negative for fever and malaise/fatigue.  HENT:  Negative for congestion.   Eyes: Negative for discharge.  Respiratory: Negative for shortness of breath.   Cardiovascular: Negative for chest pain, palpitations and leg swelling.  Gastrointestinal: Negative for nausea, abdominal pain and diarrhea.  Genitourinary: Negative for dysuria.  Musculoskeletal: Negative for falls.  Skin: Negative for rash.  Neurological: Negative for loss of consciousness and headaches.  Endo/Heme/Allergies: Negative for polydipsia.  Psychiatric/Behavioral: Negative for depression and suicidal ideas. The patient is not nervous/anxious and does not have insomnia.     Objective  BP 150/84  Pulse 60  Temp(Src) 97.8 F (36.6 C) (Oral)  Ht 5\' 2"  (1.575 m)  Wt 154 lb (69.854 kg)  BMI 28.16 kg/m2  SpO2 96%  Physical Exam  Physical Exam  Constitutional: She is oriented to person, place, and time and well-developed, well-nourished, and in no distress. No distress.  HENT:  Head: Normocephalic  and atraumatic.  Eyes: Conjunctivae are normal.  Neck: Neck supple. No thyromegaly present.  Cardiovascular: Normal rate, regular rhythm and normal heart sounds.   No murmur heard. Pulmonary/Chest: Effort normal and breath sounds normal. She has no wheezes.  Abdominal: She exhibits no distension and no mass.  Musculoskeletal: She exhibits no edema.  Lymphadenopathy:    She has no cervical adenopathy.  Neurological: She is alert and oriented to person, place, and time.  Skin: Skin is warm and dry. No rash noted. She is not diaphoretic.  Psychiatric: Memory, affect and judgment normal.    Lab Results  Component Value Date   TSH 7.909* 11/07/2013   Lab Results  Component Value Date   WBC 7.9 11/07/2013   HGB 14.3 11/07/2013   HCT 42.6 11/07/2013   MCV 89.3 11/07/2013   PLT 276 11/07/2013   Lab Results  Component Value Date   CREATININE 0.70 11/07/2013   BUN 16 11/07/2013   NA 140 11/07/2013   K 3.7 11/07/2013   CL 104 11/07/2013   CO2 25 11/07/2013   Lab Results  Component Value Date   ALT 27 11/07/2013   AST 25 11/07/2013   ALKPHOS 78 11/07/2013   BILITOT 0.4 11/07/2013   Lab Results  Component Value Date   CHOL 177 11/07/2013   Lab Results  Component Value Date   HDL 57 11/07/2013   Lab Results  Component Value Date   LDLCALC 109* 11/07/2013   Lab Results  Component Value Date   TRIG 57 11/07/2013   Lab Results  Component Value Date   CHOLHDL 3.1 11/07/2013     Assessment & Plan  Elevated BP . Encouraged heart healthy diet such as the DASH diet and exercise as tolerated.   Pure hypercholesterolemia Mild. Encouraged heart healthy diet, increase exercise, avoid trans fats, consider a krill oil cap daily  UTI (urinary tract infection) Vs interstitial cystitis. Encouraged adequate hydration and probiotics  Other abnormal glucose Normal on lab work

## 2013-11-11 ENCOUNTER — Telehealth: Payer: Self-pay

## 2013-11-11 LAB — T4, FREE: FREE T4: 0.97 ng/dL (ref 0.80–1.80)

## 2013-11-11 NOTE — Telephone Encounter (Signed)
Given to [place] [facility] with [facility]

## 2013-11-11 NOTE — Telephone Encounter (Signed)
Message copied by Court JoyFREEMAN, Diamantina Edinger L on Mon Nov 11, 2013  8:09 AM ------      Message from: Danise EdgeBLYTH, STACEY A      Created: Sun Nov 10, 2013  1:58 PM       Needs a free T4 for abnormal thyroid studies ------

## 2013-11-11 NOTE — Telephone Encounter (Signed)
Left a message for patient to return my call. 

## 2014-09-16 ENCOUNTER — Ambulatory Visit (INDEPENDENT_AMBULATORY_CARE_PROVIDER_SITE_OTHER): Payer: 59

## 2014-09-16 ENCOUNTER — Ambulatory Visit (INDEPENDENT_AMBULATORY_CARE_PROVIDER_SITE_OTHER): Payer: 59 | Admitting: Internal Medicine

## 2014-09-16 VITALS — BP 161/84 | HR 77 | Temp 97.4°F | Resp 18

## 2014-09-16 DIAGNOSIS — M25511 Pain in right shoulder: Secondary | ICD-10-CM

## 2014-09-16 DIAGNOSIS — R030 Elevated blood-pressure reading, without diagnosis of hypertension: Secondary | ICD-10-CM

## 2014-09-16 DIAGNOSIS — M7581 Other shoulder lesions, right shoulder: Secondary | ICD-10-CM

## 2014-09-16 MED ORDER — MELOXICAM 15 MG PO TABS: 15.0000 mg | ORAL_TABLET | Freq: Every day | ORAL | Status: DC

## 2014-09-16 NOTE — Progress Notes (Signed)
Subjective:  This chart was scribed for Susan Sia, MD by Richarda Overlie, Medical scribe. This patient was seen in ROOM 7 and the patient's care was started 8:13 PM.   Patient ID: Susan Terrell, female    DOB: 08/15/1956, 59 y.o.   MRN: [id number]   Chief Complaint  Patient presents with  . Shortness of Breath    X last night  . Shoulder Pain    X last night, right  . Eye Problem    Been having a blood vessel pop once a month for several months   HPI HPI Comments: Susan Terrell is a 59 y.o. female with a history of asthma who presents to [facility] complaining of right shoulder pain that started last night. Pt states her pain radiates down her right arm and into the scapular area on the R. She  describes the pain as burning and aching and worsening with shoulder movement. She denies any injury or recent heavy exertation. Pt states that it sometimes hard to take a deep breath when it her shoulder really hurts(she does not actually have shortness of breath). She reports her pain worsens with certain arm positions. She says she has not experienced any similar prior episodes of shoulder problems. 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 She states that took ibuprofen at work earlier which provided her some pain relief.   Patient Active Problem List   Diagnosis Date Noted  . UTI (urinary tract infection) 11/10/2013  . Elevated BP 11/10/2013  . Asthma   . Pure hypercholesterolemia 10/21/2011  . Other abnormal glucose 10/21/2011   She is not currently on any medications PCP-Dr. Abner Greenspan  Allergies  Allergen Reactions  . Bee Venom     Swelling localized  . Codeine   . Iodine   . Macrodantin    Review of Systems   no chills fevers or night sweats No recent activity or appetite change No recent weight change No dysphagia or esophageal reflux No recent wheezing coughing choking or shortness of breath No palpitations No exertional chest pain/no  diaphoresis/no palpitations/no nausea/no peripheral edema History of high blood pressure but off medication and reportedly doing well GU negative No history of DVTs No history of neck or lumbar problems//she does have occasional neck stiffness after sitting at work No skin rash No headaches or dizziness No recent sleep problems Last thyroid labs 11/07/2013 were borderline for hypothyroidism    Objective:   Physical Exam  Constitutional: She is oriented to person, place, and time. She appears well-developed and well-nourished. No distress.  HENT:  Head: Normocephalic and atraumatic.  Eyes: Conjunctivae and EOM are normal. Pupils are equal, round, and reactive to light.  Neck: Normal range of motion. Neck supple. No thyromegaly present.  Cardiovascular: Normal rate, regular rhythm and normal heart sounds.  Exam reveals no friction rub.   No murmur heard. Pulmonary/Chest: Effort normal and breath sounds normal. No respiratory distress.  Musculoskeletal: She exhibits no edema.  Right shoulder is TTP over the humerus, the lateral shoulder, anterior shoulder and posterior shoulder. No swelling or redness. She has pain with abduction above 45 degrees. also pain with abduction against resistance. Anterior elevation is normal. Adduction is full. She has pain with external rotation. Also pain with internal rotation Shoulder is not red or swollen and there is no localized rash Scapula moves well/there is some tenderness in the trapezius but the neck has a full range of motion  without radicular symptoms She has full grip strength on the right and there is no sensory loss in that extremity Deep tendon reflexes are symmetrical Elbow exam reveals no tenderness or loss of range of motion Deep palpation of the humerus is also within normal limits She is mildly tender in the bicipital groove but the biceps has no defect   There is no tenderness in the big muscles in the posterior lower extremities and no  swelling to suggest DVT  Lymphadenopathy:    She has no cervical adenopathy.  Neurological: She is alert and oriented to person, place, and time. No cranial nerve deficit.  Psychiatric: She has a normal mood and affect. Her behavior is normal.  Nursing note and vitals reviewed. BP 161/84 mmHg  Pulse 77  Temp(Src) 97.4 F (36.3 C) (Oral)  Resp 18  SpO2 97%  Filed Vitals:   09/16/14 1956  BP: 161/84  Pulse: 77  Temp: 97.4 F (36.3 C)  TempSrc: Oral  Resp: 18  SpO2: 97%   [facility] reading (PRIMARY) by  Dr. Merla Richesoolittle= no bony abnormalities about the shoulder. The lung that is visible is clear      Assessment & Plan:  I have participated in the care of this patient with the Advanced Practice Provider and agree with Diagnosis and Plan as documented. Kayce Chismar P. Merla Richesoolittle, M.D.  Shoulder pain secondary to rotator cuff tendinitis most consistent with some sort of postural overuse at work or with sleeping on her shoulder in an unusual position. Heat for relaxation/ice for pain/gentle range of motion/meloxicam for 7-10 days If not responding physical therapy next  BP elevated by pain--with her history of hypertension in the past she needs home blood pressure monitoring for the next few weeks with out of pain to see if she still has hypertension

## 2014-09-18 ENCOUNTER — Telehealth: Payer: Self-pay

## 2014-09-18 MED ORDER — TRAMADOL HCL 50 MG PO TABS: 100.0000 mg | ORAL_TABLET | Freq: Four times a day (QID) | ORAL | Status: DC | PRN

## 2014-09-18 NOTE — Telephone Encounter (Signed)
Patient called stating the medication she was given "Meloxicam" has not helped her. She is still a lot of pain in her rt arm and she feels sick to her stomach. She has not been able to sleep due to the pain. She is requesting a different medication and  Her call back number is [phone]

## 2014-09-18 NOTE — Progress Notes (Signed)
See phone message She has continued to have pain in the shoulder which now extends throughout the whole arm and is worse with any kind of use. She had miss work today. She has nausea which she thinks is secondary to the pain rather than secondary to meloxicam Still no fever cough or other chest discomfort. Still no problems with neck movement other than a little stiffness and the feeling that she has knots up and down her back  She needs reevaluation to look for other etiologies than just some rotator cuff pathology and is asked to return to see me in the morning at 10:00 Call in tramadol 2 tablets every 6 hours for the time being She is improved at rest in the sling so continue the sling for now  Meds ordered this encounter  Medications  . traMADol (ULTRAM) 50 MG tablet    Sig: Take 2 tablets (100 mg total) by mouth every 6 (six) hours as needed.    Dispense:  12 tablet    Refill:  0    I will phone this in from  cell phone

## 2014-09-18 NOTE — Telephone Encounter (Signed)
-----   Message from Tonye Pearsonobert P Doolittle, MD sent at 09/17/2014  9:51 PM EST ----- Please call her on Thursday for progress report

## 2014-09-18 NOTE — Telephone Encounter (Signed)
I will call her to say-- Stop melox as may cause the nausea Ok for tramadol 50mg  1-2 qid #12 I should recheck tomorrow 10-5 or or sat  8-4because she is not better--there are some gallbaldder problems that can cause nausea and shoulder If I can't get her i'll have you call

## 2014-09-19 ENCOUNTER — Ambulatory Visit: Payer: Self-pay | Admitting: Family Medicine

## 2014-09-19 ENCOUNTER — Ambulatory Visit (INDEPENDENT_AMBULATORY_CARE_PROVIDER_SITE_OTHER): Payer: 59 | Admitting: Internal Medicine

## 2014-09-19 VITALS — BP 128/84 | HR 86 | Temp 98.2°F | Resp 16 | Ht 62.0 in | Wt 158.0 lb

## 2014-09-19 DIAGNOSIS — M25511 Pain in right shoulder: Secondary | ICD-10-CM

## 2014-09-19 MED ORDER — PREDNISONE 20 MG PO TABS: ORAL_TABLET | ORAL | Status: DC

## 2014-09-19 MED ORDER — TRAMADOL HCL 50 MG PO TABS: 100.0000 mg | ORAL_TABLET | Freq: Four times a day (QID) | ORAL | Status: DC | PRN

## 2014-09-19 NOTE — Progress Notes (Signed)
   Subjective:    Patient ID: Susan Terrell, female    DOB: Jun 05, 1956, 59 y.o.   MRN: [id number]  HPInote ov 2/2 and phone call 2/4 melox is starting to help--slept last pm tho stiff and pain w/ use today Took 1 tramadol and is better tho can provoke pain with lifting arm. Pain now feels localized in shoulder although before medication it was radiating down into the forearm and into the trapezius area on the right  Fam Hx--3 sisters who have needed rotator cuff surgeries without hx of significant injury- even both shoulders in 1or2 of them.  Her xray 2/2 revealed Dystrophic calcification or calcified loose body along the anterior right glenohumeral joint  No ongoing meds or other active health issues at this point Past Surgical History  Procedure Laterality Date  . Abdominal hysterectomy    . Pilonidal cyst excision    . Incise and drain abcess      right groin  . Breast surgery      left needle biopsy     Review of Systems  no fever No rash No shortness of breath No chest pain or palpitations    Objective:   Physical Exam BP 128/84 mmHg  Pulse 86  Temp(Src) 98.2 F (36.8 C)  Resp 16  Ht 5\' 2"  (1.575 m)  Wt 158 lb (71.668 kg)  BMI 28.89 kg/m2  SpO2 94% No acute distress The right shoulder is tender to palpation anteriorly and laterally with some tenderness into the axillary area to palpation but no masses. Abduction against resistance is painful. Any lifting above 45 is painful. Painful internal rotation. No other chest wall tenderness Neck has a full range of motion without radicular symptoms Scapular movement is nontender No tenderness in the right upper quadrant abdomen    Assessment & Plan:  Shoulder pain secondary to rotator cuff tendinopathy-? Degenerative and possibly hereditary  Add pred 60-0 6d//continue meloxicam Ref to Dr Amanda PeaGramig  Meds ordered this encounter  Medications  . predniSONE (DELTASONE) 20 MG tablet    Sig: 3/3/2/2/1/1 single daily dose  for 6 days    Dispense:  12 tablet    Refill:  0  . traMADol (ULTRAM) 50 MG tablet    Sig: Take 2 tablets (100 mg total) by mouth every 6 (six) hours as needed.    Dispense:  20 tablet    Refill:  0

## 2014-09-23 ENCOUNTER — Telehealth: Payer: Self-pay | Admitting: Family Medicine

## 2014-09-23 ENCOUNTER — Other Ambulatory Visit: Payer: Self-pay | Admitting: Family Medicine

## 2014-09-23 DIAGNOSIS — M25511 Pain in right shoulder: Secondary | ICD-10-CM

## 2014-09-23 NOTE — Telephone Encounter (Signed)
So she might want to see our sports medicine doctor over at [facility] I can likely get her in sooner and he is often able to diagnose without too much intervention. Please offer her that and I will place referral if she is willing

## 2014-09-23 NOTE — Telephone Encounter (Signed)
Caller name: Lamica Relation to pt: self Call back number: [phone] Pharmacy:  Reason for call:   Patient was seen in [facility] [address] last week with arm/shoulder pain and is being referred to Dr. Dominica SeverinWilliam Gramig but next appointment will be 3/23. Patient wants to know if Dr. Abner GreenspanBlyth has a preference and if we can get patient in any sooner with anyone else.

## 2014-09-23 NOTE — Telephone Encounter (Signed)
error 

## 2014-09-23 NOTE — Telephone Encounter (Signed)
Called the patient informed of PCP instructions.  The patient would like a referral to Dr. Katrinka BlazingSmith at [facility] please.

## 2014-10-03 ENCOUNTER — Ambulatory Visit (INDEPENDENT_AMBULATORY_CARE_PROVIDER_SITE_OTHER): Payer: 59 | Admitting: Family Medicine

## 2014-10-03 ENCOUNTER — Encounter: Payer: Self-pay | Admitting: Family Medicine

## 2014-10-03 ENCOUNTER — Other Ambulatory Visit (INDEPENDENT_AMBULATORY_CARE_PROVIDER_SITE_OTHER): Payer: 59

## 2014-10-03 VITALS — BP 128/80 | HR 69 | Ht 62.0 in | Wt 159.0 lb

## 2014-10-03 DIAGNOSIS — M67911 Unspecified disorder of synovium and tendon, right shoulder: Secondary | ICD-10-CM

## 2014-10-03 DIAGNOSIS — M67919 Unspecified disorder of synovium and tendon, unspecified shoulder: Secondary | ICD-10-CM | POA: Insufficient documentation

## 2014-10-03 DIAGNOSIS — M25511 Pain in right shoulder: Secondary | ICD-10-CM

## 2014-10-03 NOTE — Progress Notes (Signed)
Tawana ScaleZach Renatha Rosen D.O. [facility] [address] [place], [place] [address] Phone: [phone] Subjective:    I'm seeing this patient by the request  of:  Danise EdgeBLYTH, STACEY, MD   CC: Right shoulder pain  BJY:[id number]:Subjective Susan Terrell is a 59 y.o. female coming in with complaint of right shoulder pain. At the beginning of February patient was seen in urgent care for right shoulder pain though did give her some radiation to her right arm. Patient was seen by provider and there was a concern for more of a rotator cuff tendinitis. Patient was given anti-inflammatories. Patient then was seen 3 days later again and there is question for more of a degenerative change in the shoulder with a repeat x-ray.  Was then given prednisone and was referred to a hand specialist. Patient was sent here though for further evaluation. Patient states when she was on the prednisone and helped out significantly. Patient states that slowly the pain is starting to increase again. Patient has been wearing a sling on a regular basis. Patient denies any weakness states that the radiation down the arm is much better. Patient denies any neck pain that is associated with it. States that the severity of pain is approximately 3 out of 10.  Reviewing patient's previous images she does have a calcification. This is definitely extra articular.     Past medical history, social, surgical and family history all reviewed in electronic medical record.   Review of Systems: No headache, visual changes, nausea, vomiting, diarrhea, constipation, dizziness, abdominal pain, skin rash, fevers, chills, night sweats, weight loss, swollen lymph nodes, body aches, joint swelling, muscle aches, chest pain, shortness of breath, mood changes.   Objective Blood pressure 128/80, pulse 69, height 5\' 2"  (1.575 m), weight 159 lb (72.122 kg), SpO2 97 %.  General: No apparent distress alert and oriented x3 mood and affect normal, dressed appropriately.    HEENT: Pupils equal, extraocular movements intact  Respiratory: Patient's speak in full sentences and does not appear short of breath  Cardiovascular: No lower extremity edema, non tender, no erythema  Skin: Warm dry intact with no signs of infection or rash on extremities or on axial skeleton.  Abdomen: Soft nontender  Neuro: Cranial nerves II through XII are intact, neurovascularly intact in all extremities with 2+ DTRs and 2+ pulses.  Lymph: No lymphadenopathy of posterior or anterior cervical chain or axillae bilaterally.  Gait normal with good balance and coordination.  MSK:  Non tender with full range of motion and good stability and symmetric strength and tone of  elbows, wrist, hip, knee and ankles bilaterally.  Shoulder: Right Inspection reveals no abnormalities, atrophy or asymmetry. Palpation is normal with no tenderness over AC joint or bicipital groove. ROM is full in all planes passively. Rotator cuff strength normal throughout. signs of impingement with positive Neer and Hawkin's tests, but negative empty can sign. Speeds and Yergason's tests normal. No labral pathology noted with negative Obrien's, negative clunk and good stability. Normal scapular function observed. No painful arc and no drop arm sign. No apprehension sign  MSK US performed of: Right This study was ordered, performed, and interpreted by Terrilee FilesZach Woodley Petzold D.O.  Shoulder:   Supraspinatus:  Appears normal on long and transverse views mild increased Doppler flow, Bursal bulge seen with shoulder abduction on impingement view. Infraspinatus:  Appears normal on long and transverse views. Significant increase in Doppler flow Subscapularis:  Appears normal on long and transverse views. Positive bursa Teres Minor:  Appears normal on long and transverse views. AC joint:  Moderate degenerative changes Glenohumeral Joint:  Mild degenerative changes Glenoid Labrum:  Intact without visualized tears. Biceps Tendon:   Appears normal on long and transverse views, no fraying of tendon, tendon located in intertubercular groove, no subluxation with shoulder internal or external rotation.  Impression: Subacromial bursitis mild tenondopathy.   Procedure note 97110; 15 minutes spent for Therapeutic exercises as stated in above notes.  This included exercises focusing on stretching, strengthening, with significant focus on eccentric aspects.   Proper technique shown and discussed handout in great detail with ATC.  All questions were discussed and answered.     Impression and Recommendations:     This case required medical decision making of moderate complexity.

## 2014-10-03 NOTE — Patient Instructions (Signed)
Good to see you Ice 20 minutes 2 times daily. Usually after activity and before bed. Exercises 3 times a week.  Pennsaid twice dail yas needed Vitamin D 2000IU  turmeirc 500mg  twice daily Meloxicam 3 times a week.  See me again i n3-4 weeks.

## 2014-10-03 NOTE — Assessment & Plan Note (Signed)
Patient does not have any true tear appreciated and patient has no weakness on physical exam today. Patient has very mild impingement signs and states that she is actually doing relatively well. Patient will try conservative therapy and did work with Event organiserathletic trainer today. We discussed icing regimen, topical anti-inflammatories as well as decreasing patient's oral anti-inflammatories. Patient has any worsening symptoms we will have patient come back. Patient will follow-up in 3 weeks no for further evaluation scheduled.

## 2014-10-03 NOTE — Progress Notes (Signed)
Pre visit review using our clinic review tool, if applicable. No additional management support is needed unless otherwise documented below in the visit note. 

## 2014-10-24 ENCOUNTER — Ambulatory Visit: Payer: 59 | Admitting: Family Medicine

## 2014-10-31 ENCOUNTER — Encounter: Payer: Self-pay | Admitting: Family Medicine

## 2014-10-31 ENCOUNTER — Ambulatory Visit (INDEPENDENT_AMBULATORY_CARE_PROVIDER_SITE_OTHER): Payer: 59 | Admitting: Family Medicine

## 2014-10-31 ENCOUNTER — Other Ambulatory Visit (INDEPENDENT_AMBULATORY_CARE_PROVIDER_SITE_OTHER): Payer: 59

## 2014-10-31 VITALS — BP 136/80 | HR 88 | Wt 160.0 lb

## 2014-10-31 DIAGNOSIS — M25511 Pain in right shoulder: Secondary | ICD-10-CM

## 2014-10-31 DIAGNOSIS — M67911 Unspecified disorder of synovium and tendon, right shoulder: Secondary | ICD-10-CM

## 2014-10-31 NOTE — Progress Notes (Signed)
Susan ScaleZach Yunior Terrell D.O. [facility] [address] [place], [place] [address] Phone: [phone] Subjective:     CC: Right shoulder pain follow up  QMV:[id number]:Subjective Susan Terrell is a 59 y.o. female coming in with complaint of right shoulder pain. Patient saw me previously did have more of a subacromial bursitis as well as a tendinopathy of the rotator cuff. No true tear appreciated. Patient given home exercises, icing protocol, topical anti-inflammatories. Patient states that she has been doing conservative therapy intermittently and would say she is only approximately 10-20% better. Patient states that still at the end of the day after working for long amount of time she has more discomfort. Patient denies any weakness but states that it is now waking her up at night. Patient states she is having such pain, it is starting to cause her to change her daily activities.    Reviewing patient's previous images she does have a calcification. This is definitely extra articular.     Past medical history, social, surgical and family history all reviewed in electronic medical record.   Review of Systems: No headache, visual changes, nausea, vomiting, diarrhea, constipation, dizziness, abdominal pain, skin rash, fevers, chills, night sweats, weight loss, swollen lymph nodes, body aches, joint swelling, muscle aches, chest pain, shortness of breath, mood changes.   Objective Blood pressure 136/80, pulse 88, weight 160 lb (72.576 kg), SpO2 95 %.  General: No apparent distress alert and oriented x3 mood and affect normal, dressed appropriately.  HEENT: Pupils equal, extraocular movements intact  Respiratory: Patient's speak in full sentences and does not appear short of breath  Cardiovascular: No lower extremity edema, non tender, no erythema  Skin: Warm dry intact with no signs of infection or rash on extremities or on axial skeleton.  Abdomen: Soft nontender  Neuro: Cranial nerves II through XII  are intact, neurovascularly intact in all extremities with 2+ DTRs and 2+ pulses.  Lymph: No lymphadenopathy of posterior or anterior cervical chain or axillae bilaterally.  Gait normal with good balance and coordination.  MSK:  Non tender with full range of motion and good stability and symmetric strength and tone of  elbows, wrist, hip, knee and ankles bilaterally.  Shoulder: Right Inspection reveals no abnormalities, atrophy or asymmetry. Palpation is normal with no tenderness over AC joint or bicipital groove. ROM is full in all planes passively. Rotator cuff strength normal throughout. signs of impingement with positive Neer and Hawkin's tests, but negative empty can sign. Speeds and Yergason's tests normal. No labral pathology noted with negative Obrien's, negative clunk and good stability. Normal scapular function observed. No painful arc and no drop arm sign. No apprehension sign No change from previous exam  Procedure: Real-time Ultrasound Guided Injection of right glenohumeral joint Device: GE Logiq E  Ultrasound guided injection is preferred based studies that show increased duration, increased effect, greater accuracy, decreased procedural pain, increased response rate with ultrasound guided versus blind injection.  Verbal informed consent obtained.  Time-out conducted.  Noted no overlying erythema, induration, or other signs of local infection.  Skin prepped in a sterile fashion.  Local anesthesia: Topical Ethyl chloride.  With sterile technique and under real time ultrasound guidance:  Joint visualized.  23g 1  inch needle inserted posterior approach. Pictures taken for needle placement. Patient did have injection of 2 cc of 1% lidocaine, 2 cc of 0.5% Marcaine, and 1.0 cc of Kenalog 40 mg/dL. Completed without difficulty  Pain immediately resolved suggesting accurate placement of the medication.  Advised to call if fevers/chills, erythema, induration, drainage, or  persistent bleeding.  Images permanently stored and available for review in the ultrasound unit.  Impression: Technically successful ultrasound guided injection.      Impression and Recommendations:     This case required medical decision making of moderate complexity.

## 2014-10-31 NOTE — Assessment & Plan Note (Signed)
Patient was given an injection today. Patient will go to formal physical therapy. Discussed icing and home exercises. Discussed what activities to potentially avoid. Patient will continue the anti-inflammatories and tramadol for breakthrough pain. Patient come back and see me again in 4 weeks for further evaluation.

## 2014-10-31 NOTE — Patient Instructions (Signed)
Good to see you Ice is your friend Continue the vitamins and the topical medicine Physical therapy will be calling you and call us if you have not heard by Wednesday See me again in 4 weeks.

## 2014-10-31 NOTE — Progress Notes (Signed)
Pre visit review using our clinic review tool, if applicable. No additional management support is needed unless otherwise documented below in the visit note. 

## 2014-11-28 ENCOUNTER — Telehealth: Payer: Self-pay | Admitting: Family Medicine

## 2014-11-28 DIAGNOSIS — M67911 Unspecified disorder of synovium and tendon, right shoulder: Secondary | ICD-10-CM

## 2014-11-28 NOTE — Telephone Encounter (Signed)
Patient called to check status of PT referral. I do not see that one has been entered. Please advise.

## 2014-12-01 ENCOUNTER — Ambulatory Visit: Payer: 59 | Admitting: Family Medicine

## 2014-12-01 NOTE — Telephone Encounter (Signed)
Referral entered  

## 2014-12-01 NOTE — Telephone Encounter (Signed)
Referral in queue for [facility] on [address]

## 2015-02-18 ENCOUNTER — Telehealth: Payer: Self-pay | Admitting: Family Medicine

## 2015-02-18 NOTE — Telephone Encounter (Signed)
Spoke with patient regarding her concerns about the headaches. Patient verbalized that it's been about two weeks now that the headaches have been reoccurring off and on. She's not reporting any blurred/double vision, inability to walk without assistance, slurred speech, confusion, weakness in limbs or passing out. Appointment has been scheduled on 02/20/15 for the patient to be seen by the provider. Patient advised to go to the emergency room if her symptoms worsen.

## 2015-02-18 NOTE — Telephone Encounter (Signed)
Relation to pt: self  Call back number: [phone]   Reason for call:  Pt in need of clinical advice pt would like to know if stress causes head aches, is there any OTC medication that may relieve stress/head aches. Pt was offered appointment she insisted on speaking with the nurse first. Please advise

## 2015-02-20 ENCOUNTER — Ambulatory Visit (INDEPENDENT_AMBULATORY_CARE_PROVIDER_SITE_OTHER): Payer: 59 | Admitting: Family Medicine

## 2015-02-20 ENCOUNTER — Encounter: Payer: Self-pay | Admitting: Family Medicine

## 2015-02-20 VITALS — BP 120/80 | HR 72 | Temp 98.3°F | Ht 63.0 in | Wt 150.5 lb

## 2015-02-20 DIAGNOSIS — E785 Hyperlipidemia, unspecified: Secondary | ICD-10-CM

## 2015-02-20 DIAGNOSIS — IMO0001 Reserved for inherently not codable concepts without codable children: Secondary | ICD-10-CM

## 2015-02-20 DIAGNOSIS — E559 Vitamin D deficiency, unspecified: Secondary | ICD-10-CM | POA: Diagnosis not present

## 2015-02-20 DIAGNOSIS — R51 Headache: Secondary | ICD-10-CM

## 2015-02-20 DIAGNOSIS — E7800 Pure hypercholesterolemia, unspecified: Secondary | ICD-10-CM

## 2015-02-20 DIAGNOSIS — R739 Hyperglycemia, unspecified: Secondary | ICD-10-CM | POA: Diagnosis not present

## 2015-02-20 DIAGNOSIS — R519 Headache, unspecified: Secondary | ICD-10-CM

## 2015-02-20 DIAGNOSIS — N390 Urinary tract infection, site not specified: Secondary | ICD-10-CM

## 2015-02-20 DIAGNOSIS — R030 Elevated blood-pressure reading, without diagnosis of hypertension: Secondary | ICD-10-CM | POA: Diagnosis not present

## 2015-02-20 HISTORY — DX: Vitamin D deficiency, unspecified: E55.9

## 2015-02-20 LAB — CBC
HCT: 41.8 % (ref 36.0–46.0)
Hemoglobin: 13.8 g/dL (ref 12.0–15.0)
MCH: 30.1 pg (ref 26.0–34.0)
MCHC: 33 g/dL (ref 30.0–36.0)
MCV: 91.1 fL (ref 78.0–100.0)
MPV: 10 fL (ref 8.6–12.4)
PLATELETS: 254 10*3/uL (ref 150–400)
RBC: 4.59 MIL/uL (ref 3.87–5.11)
RDW: 13.8 % (ref 11.5–15.5)
WBC: 6.7 10*3/uL (ref 4.0–10.5)

## 2015-02-20 LAB — HEMOGLOBIN A1C
HEMOGLOBIN A1C: 5.6 % (ref <5.7)
MEAN PLASMA GLUCOSE: 114 mg/dL (ref <117)

## 2015-02-20 LAB — COMPREHENSIVE METABOLIC PANEL
ALT: 15 U/L (ref 0–35)
AST: 20 U/L (ref 0–37)
Albumin: 3.9 g/dL (ref 3.5–5.2)
Alkaline Phosphatase: 75 U/L (ref 39–117)
BUN: 20 mg/dL (ref 6–23)
CO2: 25 mEq/L (ref 19–32)
CREATININE: 0.73 mg/dL (ref 0.50–1.10)
Calcium: 8.8 mg/dL (ref 8.4–10.5)
Chloride: 106 mEq/L (ref 96–112)
GLUCOSE: 86 mg/dL (ref 70–99)
Potassium: 3.6 mEq/L (ref 3.5–5.3)
Sodium: 139 mEq/L (ref 135–145)
Total Bilirubin: 0.5 mg/dL (ref 0.2–1.2)
Total Protein: 6.7 g/dL (ref 6.0–8.3)

## 2015-02-20 LAB — LIPID PANEL
Cholesterol: 183 mg/dL (ref 0–200)
HDL: 52 mg/dL (ref >=46)
LDL Cholesterol: 118 mg/dL — ABNORMAL HIGH (ref 0–99)
Total CHOL/HDL Ratio: 3.5 Ratio
Triglycerides: 65 mg/dL (ref <150)
VLDL: 13 mg/dL (ref 0–40)

## 2015-02-20 LAB — TSH: TSH: 4.45 u[IU]/mL (ref 0.350–4.500)

## 2015-02-20 MED ORDER — ALPRAZOLAM 0.25 MG PO TABS: 0.2500 mg | ORAL_TABLET | Freq: Every day | ORAL | Status: DC | PRN

## 2015-02-20 NOTE — Progress Notes (Signed)
Pre visit review using our clinic review tool, if applicable. No additional management support is needed unless otherwise documented below in the visit note. 

## 2015-02-20 NOTE — Patient Instructions (Addendum)
Post-Concussion Syndrome Post-concussion syndrome describes the symptoms that can occur after a head injury. These symptoms can last from weeks to months. CAUSES  It is not clear why some head injuries cause post-concussion syndrome. It can occur whether your head injury was mild or severe and whether you were wearing head protection or not.  SIGNS AND SYMPTOMS  Memory difficulties.  Dizziness.  Headaches.  Double vision or blurry vision.  Sensitivity to light.  Hearing difficulties.  Depression.  Tiredness.  Weakness.  Difficulty with concentration.  Difficulty sleeping or staying asleep.  Vomiting.  Poor balance or instability on your feet.  Slow reaction time.  Difficulty learning and remembering things you have heard. DIAGNOSIS  There is no test to determine whether you have post-concussion syndrome. Your health care provider may order an imaging scan of your brain, such as a CT scan, to check for other problems that may be causing your symptoms (such as severe injury inside your skull). TREATMENT  Usually, these problems disappear over time without medical care. Your health care provider may prescribe medicine to help ease your symptoms. It is important to follow up with a neurologist to evaluate your recovery and address any lingering symptoms or issues. HOME CARE INSTRUCTIONS   Only take over-the-counter or prescription medicines for pain, discomfort, or fever as directed by your health care provider. Do not take aspirin. Aspirin can slow blood clotting.  Sleep with your head slightly elevated to help with headaches.  Avoid any situation where there is potential for another head injury (football, hockey, soccer, basketball, martial arts, downhill snow sports, and horseback riding). Your condition will get worse every time you experience a concussion. You should avoid these activities until you are evaluated by the appropriate follow-up health care  providers.  Keep all follow-up appointments as directed by your health care provider. SEEK IMMEDIATE MEDICAL CARE IF:  You develop confusion or unusual drowsiness.  You cannot wake the injured person.  You develop nausea or persistent, forceful vomiting.  You feel like you are moving when you are not (vertigo).  You notice the injured person's eyes moving rapidly back and forth. This may be a sign of vertigo.  You have convulsions or faint.  You have severe, persistent headaches that are not relieved by medicine.  You cannot use your arms or legs normally.  Your pupils change size.  You have clear or bloody discharge from the nose or ears.  Your problems are getting worse, not better. MAKE SURE YOU:  Understand these instructions.  Will watch your condition.  Will get help right away if you are not doing well or get worse. Document Released: 01/21/2002 Document Revised: 05/22/2013 Document Reviewed: 11/06/2013 [facility] Patient Information 2015 [place], [place]. This information is not intended to replace advice given to you by your health care provider. Make sure you discuss any questions you have with your health care provider. Ringworm, Nail A fungal infection of the nail (tinea unguium/onychomycosis) is common. It is common as the visible part of the nail is composed of dead cells which have no blood supply to help prevent infection. It occurs because fungi are everywhere and will pick any opportunity to grow on any dead material. Because nails are very slow growing they require up to 2 years of treatment with anti-fungal medications. The entire nail back to the base is infected. This includes approximately  of the nail which you cannot see. If your caregiver has prescribed a medication by mouth, take it every day  and as directed. No progress will be seen for at least 6 to 9 months. Do not be disappointed! Because fungi live on dead cells with little or no exposure to blood  supply, medication delivery to the infection is slow; thus the cure is slow. It is also why you can observe no progress in the first 6 months. The nail becoming cured is the base of the nail, as it has the blood supply. Topical medication such as creams and ointments are usually not effective. Important in successful treatment of nail fungus is closely following the medication regimen that your doctor prescribes. Sometimes you and your caregiver may elect to speed up this process by surgical removal of all the nails. Even this may still require 6 to 9 months of additional oral medications. See your caregiver as directed. Remember there will be no visible improvement for at least 6 months. See your caregiver sooner if other signs of infection (redness and swelling) develop. Document Released: 07/29/2000 Document Revised: 10/24/2011 Document Reviewed: 10/07/2008 [facility] Patient Information 2015 [place], [place]. This information is not intended to replace advice given to you by your health care provider. Make sure you discuss any questions you have with your health care provider.   Encouraged increased hydration, 64 ounces of clear fluids daily. Minimize alcohol and caffeine. Eat small frequent meals with lean proteins and complex carbs. Avoid high and low blood sugars. Get adequate sleep, 7-8 hours a night. Needs exercise daily preferably in the morning.   Soak in white vinegar and hot water, listerine, apply Vick's Vapor Rub to nails  Each night  Try Salon Pas gel or Aspercreme to TMJ joint

## 2015-02-21 LAB — URINALYSIS
BILIRUBIN URINE: NEGATIVE
GLUCOSE, UA: NEGATIVE mg/dL
Hgb urine dipstick: NEGATIVE
Ketones, ur: NEGATIVE mg/dL
Nitrite: NEGATIVE
Protein, ur: NEGATIVE mg/dL
Specific Gravity, Urine: 1.026 (ref 1.005–1.030)
Urobilinogen, UA: 0.2 mg/dL (ref 0.0–1.0)
pH: 5 (ref 5.0–8.0)

## 2015-02-21 LAB — VITAMIN D 25 HYDROXY (VIT D DEFICIENCY, FRACTURES): Vit D, 25-Hydroxy: 15 ng/mL — ABNORMAL LOW (ref 30–100)

## 2015-02-22 LAB — URINE CULTURE
Colony Count: NO GROWTH
Organism ID, Bacteria: NO GROWTH

## 2015-02-23 ENCOUNTER — Other Ambulatory Visit: Payer: Self-pay | Admitting: Family Medicine

## 2015-02-23 DIAGNOSIS — E559 Vitamin D deficiency, unspecified: Secondary | ICD-10-CM

## 2015-02-23 MED ORDER — ERGOCALCIFEROL 1.25 MG (50000 UT) PO CAPS: 50000.0000 [IU] | ORAL_CAPSULE | ORAL | Status: DC

## 2015-03-01 ENCOUNTER — Encounter: Payer: Self-pay | Admitting: Family Medicine

## 2015-03-01 DIAGNOSIS — R519 Headache, unspecified: Secondary | ICD-10-CM

## 2015-03-01 DIAGNOSIS — R51 Headache: Secondary | ICD-10-CM

## 2015-03-01 HISTORY — DX: Headache, unspecified: R51.9

## 2015-03-01 NOTE — Assessment & Plan Note (Signed)
Notes a persistent headache which is improving but still present s/p a fall 1 1/2 weeks ago. She fell on her left side but also hit her head. She has noted some mild increase in fatigue this week but no other concerning symptoms. Likely post concussive, she will rest and report if headaches worsen for further evaluation

## 2015-03-01 NOTE — Progress Notes (Signed)
Susan QualeDebra T Terrell  [id number] June 18, 1956 03/01/2015      Progress Note-Follow Up  Subjective  Chief Complaint  Chief Complaint  Patient presents with  . Headache    toe nails    HPI  Patient is a 59 y.o. female in today for routine medical care. Patient is in today for follow-up noting a headache. She fell by tripping on some papers a week and a half ago and hurt her left side. She also hit her head and has had a low-grade headache since. She notes it is improving. There've been no other neurologic complaints associated and there was no loss of consciousness. She is noting some slightly worse fatigue than usual. Denies CP/palp/SOB/HA/congestion/fevers/GI or GU c/o. Taking meds as prescribed Past Medical History  Diagnosis Date  . IBS (irritable bowel syndrome) 2010  . Osteopenia 2013  . Anal fissure   . UTI (lower urinary tract infection)   . Pneumonia   . Chicken pox as a child  . Measles as a child  . Mumps as a child  . Asthma     childhood?  . Elevated BP 11/10/2013  . Vitamin D deficiency 02/20/2015    Past Surgical History  Procedure Laterality Date  . Abdominal hysterectomy    . Pilonidal cyst excision    . Incise and drain abcess      right groin  . Breast surgery      left needle biopsy    Family History  Problem Relation Age of Onset  . Cancer Father     pancreatic   . Heart attack Brother   . Heart disease Brother     MI  . Cancer Mother     colon  . Hyperlipidemia Mother   . Glaucoma Mother   . Thyroid disease Mother   . Hypertension Mother   . Cataracts Sister   . Asthma Daughter   . Hypertension Daughter   . Cataracts Maternal Grandmother   . Alcohol abuse Maternal Grandfather   . Hyperlipidemia Sister   . Glaucoma Sister   . Arthritis Sister     rheumatoid  . Glaucoma Brother   . Hyperlipidemia Brother   . Hypertension Brother   . Other Brother     carotid artery disease  . Colitis Daughter   . Hyperlipidemia Daughter   . Asthma  Daughter     History   Social History  . Marital Status: Married    Spouse Name: N/A  . Number of Children: N/A  . Years of Education: N/A   Occupational History  . Not on file.   Social History Main Topics  . Smoking status: Never Smoker   . Smokeless tobacco: Never Used  . Alcohol Use: No  . Drug Use: No  . Sexual Activity: Yes    Birth Control/ Protection: Surgical     Comment: lives with husband and daughter, no dietary restrictions.   Other Topics Concern  . Not on file   Social History Narrative    Current Outpatient Prescriptions on File Prior to Visit  Medication Sig Dispense Refill  . EPINEPHrine (EPIPEN 2-PAK) 0.3 mg/0.3 mL DEVI Inject 0.3 mLs (0.3 mg total) into the muscle once. 1 Device 3  . meloxicam (MOBIC) 15 MG tablet Take 1 tablet (15 mg total) by mouth daily. 30 tablet 0  . traMADol (ULTRAM) 50 MG tablet Take 2 tablets (100 mg total) by mouth every 6 (six) hours as needed. 20 tablet 0   No current facility-administered  medications on file prior to visit.    Allergies  Allergen Reactions  . Bee Venom     Swelling localized  . Codeine   . Iodine   . Macrodantin     Review of Systems  Review of Systems  Constitutional: Negative for fever and malaise/fatigue.  HENT: Negative for congestion.   Eyes: Negative for discharge.  Respiratory: Negative for shortness of breath.   Cardiovascular: Negative for chest pain, palpitations and leg swelling.  Gastrointestinal: Negative for nausea, abdominal pain and diarrhea.  Genitourinary: Negative for dysuria.  Musculoskeletal: Negative for falls.  Skin: Positive for itching. Negative for rash.  Neurological: Positive for headaches. Negative for loss of consciousness.  Endo/Heme/Allergies: Negative for polydipsia.  Psychiatric/Behavioral: Negative for depression and suicidal ideas. The patient is not nervous/anxious and does not have insomnia.     Objective  BP 120/80 mmHg  Pulse 72  Temp(Src) 98.3  F (36.8 C) (Oral)  Ht 5\' 3"  (1.6 m)  Wt 150 lb 8 oz (68.266 kg)  BMI 26.67 kg/m2  SpO2 97%  Physical Exam  Physical Exam  Constitutional: She is oriented to person, place, and time and well-developed, well-nourished, and in no distress. No distress.  HENT:  Head: Normocephalic and atraumatic.  Eyes: Conjunctivae are normal.  Neck: Neck supple. No thyromegaly present.  Cardiovascular: Normal rate, regular rhythm and normal heart sounds.   No murmur heard. Pulmonary/Chest: Effort normal and breath sounds normal. She has no wheezes.  Abdominal: She exhibits no distension and no mass.  Musculoskeletal: She exhibits no edema.  Lymphadenopathy:    She has no cervical adenopathy.  Neurological: She is alert and oriented to person, place, and time.  Skin: Skin is warm and dry. No rash noted. She is not diaphoretic.  Psychiatric: Memory, affect and judgment normal.    Lab Results  Component Value Date   TSH 4.450 02/20/2015   Lab Results  Component Value Date   WBC 6.7 02/20/2015   HGB 13.8 02/20/2015   HCT 41.8 02/20/2015   MCV 91.1 02/20/2015   PLT 254 02/20/2015   Lab Results  Component Value Date   CREATININE 0.73 02/20/2015   BUN 20 02/20/2015   NA 139 02/20/2015   K 3.6 02/20/2015   CL 106 02/20/2015   CO2 25 02/20/2015   Lab Results  Component Value Date   ALT 15 02/20/2015   AST 20 02/20/2015   ALKPHOS 75 02/20/2015   BILITOT 0.5 02/20/2015   Lab Results  Component Value Date   CHOL 183 02/20/2015   Lab Results  Component Value Date   HDL 52 02/20/2015   Lab Results  Component Value Date   LDLCALC 118* 02/20/2015   Lab Results  Component Value Date   TRIG 65 02/20/2015   Lab Results  Component Value Date   CHOLHDL 3.5 02/20/2015     Assessment & Plan  Elevated BP Well controlled, no changes to meds. Encouraged heart healthy diet such as the DASH diet and exercise as tolerated.   Vitamin D deficiency Notably low. Started on 50000 IU  caps weekly and daily supplements and recheck in 3 months  Pure hypercholesterolemia Encouraged heart healthy diet, increase exercise, avoid trans fats, consider a krill oil cap daily  Other abnormal glucose minimize simple carbs. Increase exercise as tolerated.   Headache Notes a persistent headache which is improving but still present s/p a fall 1 1/2 weeks ago. She fell on her left side but also hit her head. She  has noted some mild increase in fatigue this week but no other concerning symptoms. Likely post concussive, she will rest and report if headaches worsen for further evaluation

## 2015-03-01 NOTE — Assessment & Plan Note (Signed)
minimize simple carbs. Increase exercise as tolerated.  

## 2015-03-01 NOTE — Assessment & Plan Note (Signed)
Well controlled, no changes to meds. Encouraged heart healthy diet such as the DASH diet and exercise as tolerated.  °

## 2015-03-01 NOTE — Assessment & Plan Note (Signed)
Encouraged heart healthy diet, increase exercise, avoid trans fats, consider a krill oil cap daily 

## 2015-03-01 NOTE — Assessment & Plan Note (Signed)
Notably low. Started on 50000 IU caps weekly and daily supplements and recheck in 3 months

## 2015-09-01 ENCOUNTER — Ambulatory Visit (INDEPENDENT_AMBULATORY_CARE_PROVIDER_SITE_OTHER): Payer: 59 | Admitting: Family Medicine

## 2015-09-01 ENCOUNTER — Encounter: Payer: Self-pay | Admitting: Family Medicine

## 2015-09-01 ENCOUNTER — Other Ambulatory Visit (HOSPITAL_COMMUNITY)
Admission: RE | Admit: 2015-09-01 | Discharge: 2015-09-01 | Disposition: A | Discharge status: Home / Self Care | Payer: 59 | Source: Ambulatory Visit | Attending: Family Medicine | Admitting: Family Medicine

## 2015-09-01 VITALS — BP 120/70 | HR 69 | Temp 97.9°F | Ht 63.0 in | Wt 151.5 lb

## 2015-09-01 DIAGNOSIS — R32 Unspecified urinary incontinence: Secondary | ICD-10-CM

## 2015-09-01 DIAGNOSIS — E782 Mixed hyperlipidemia: Secondary | ICD-10-CM

## 2015-09-01 DIAGNOSIS — IMO0001 Reserved for inherently not codable concepts without codable children: Secondary | ICD-10-CM

## 2015-09-01 DIAGNOSIS — Z0000 Encounter for general adult medical examination without abnormal findings: Secondary | ICD-10-CM

## 2015-09-01 DIAGNOSIS — Z124 Encounter for screening for malignant neoplasm of cervix: Secondary | ICD-10-CM | POA: Diagnosis not present

## 2015-09-01 DIAGNOSIS — R7309 Other abnormal glucose: Secondary | ICD-10-CM | POA: Diagnosis not present

## 2015-09-01 DIAGNOSIS — R030 Elevated blood-pressure reading, without diagnosis of hypertension: Secondary | ICD-10-CM

## 2015-09-01 DIAGNOSIS — Z01419 Encounter for gynecological examination (general) (routine) without abnormal findings: Secondary | ICD-10-CM | POA: Diagnosis not present

## 2015-09-01 DIAGNOSIS — N760 Acute vaginitis: Secondary | ICD-10-CM | POA: Insufficient documentation

## 2015-09-01 DIAGNOSIS — E7800 Pure hypercholesterolemia, unspecified: Secondary | ICD-10-CM

## 2015-09-01 DIAGNOSIS — E039 Hypothyroidism, unspecified: Secondary | ICD-10-CM

## 2015-09-01 DIAGNOSIS — E559 Vitamin D deficiency, unspecified: Secondary | ICD-10-CM | POA: Diagnosis not present

## 2015-09-01 HISTORY — DX: Encounter for screening for malignant neoplasm of cervix: Z12.4

## 2015-09-01 HISTORY — DX: Encounter for general adult medical examination without abnormal findings: Z00.00

## 2015-09-01 LAB — CBC
HEMATOCRIT: 43.7 % (ref 36.0–46.0)
Hemoglobin: 14.2 g/dL (ref 12.0–15.0)
MCHC: 32.5 g/dL (ref 30.0–36.0)
MCV: 90.8 fl (ref 78.0–100.0)
PLATELETS: 242 10*3/uL (ref 150.0–400.0)
RBC: 4.81 Mil/uL (ref 3.87–5.11)
RDW: 14.3 % (ref 11.5–15.5)
WBC: 7.4 10*3/uL (ref 4.0–10.5)

## 2015-09-01 LAB — URINALYSIS, ROUTINE W REFLEX MICROSCOPIC
BILIRUBIN URINE: NEGATIVE
KETONES UR: NEGATIVE
LEUKOCYTES UA: NEGATIVE
NITRITE: NEGATIVE
PH: 5.5 (ref 5.0–8.0)
Specific Gravity, Urine: 1.025 (ref 1.000–1.030)
TOTAL PROTEIN, URINE-UPE24: NEGATIVE
Urine Glucose: NEGATIVE
Urobilinogen, UA: 0.2 (ref 0.0–1.0)

## 2015-09-01 LAB — LIPID PANEL
Cholesterol: 197 mg/dL (ref 0–200)
HDL: 55.3 mg/dL (ref >39.00)
LDL Cholesterol: 132 mg/dL — ABNORMAL HIGH (ref 0–99)
NonHDL: 142.01
TRIGLYCERIDES: 51 mg/dL (ref 0.0–149.0)
Total CHOL/HDL Ratio: 4
VLDL: 10.2 mg/dL (ref 0.0–40.0)

## 2015-09-01 LAB — COMPREHENSIVE METABOLIC PANEL
ALBUMIN: 4.1 g/dL (ref 3.5–5.2)
ALK PHOS: 80 U/L (ref 39–117)
ALT: 16 U/L (ref 0–35)
AST: 20 U/L (ref 0–37)
BILIRUBIN TOTAL: 0.4 mg/dL (ref 0.2–1.2)
BUN: 22 mg/dL (ref 6–23)
CALCIUM: 8.8 mg/dL (ref 8.4–10.5)
CO2: 29 mEq/L (ref 19–32)
CREATININE: 0.78 mg/dL (ref 0.40–1.20)
Chloride: 105 mEq/L (ref 96–112)
GFR: 79.67 mL/min (ref >60.00)
Glucose, Bld: 86 mg/dL (ref 70–99)
Potassium: 3.6 mEq/L (ref 3.5–5.1)
Sodium: 140 mEq/L (ref 135–145)
TOTAL PROTEIN: 7.4 g/dL (ref 6.0–8.3)

## 2015-09-01 LAB — VITAMIN D 25 HYDROXY (VIT D DEFICIENCY, FRACTURES): VITD: 16.59 ng/mL — ABNORMAL LOW (ref 30.00–100.00)

## 2015-09-01 LAB — TSH: TSH: 7.96 u[IU]/mL — ABNORMAL HIGH (ref 0.35–4.50)

## 2015-09-01 NOTE — Assessment & Plan Note (Addendum)
Labs reveal deficiency. Start on Vitamin D 50000 IU caps, 1 cap po weekly x 12 weeks. Disp #4 with 4 rf. Also take daily Vitamin D over the counter. If already taking a daily supplement increase by 1000 IU daily and if not start Vitamin D 2000 IU daily.  

## 2015-09-01 NOTE — Progress Notes (Signed)
Pre visit review using our clinic review tool, if applicable. No additional management support is needed unless otherwise documented below in the visit note. 

## 2015-09-01 NOTE — Progress Notes (Signed)
Patient ID: Susan Terrell, female   DOB: 07-09-1956, 60 y.o.   MRN: [id number]   Subjective:    Patient ID: Susan Terrell, female    DOB: [birth year], 60 y.o.   MRN: [id number]  Chief Complaint  Patient presents with  . Annual Exam    HPI Patient is in today for annual exam and follow up on medical conditions. No recent illness or acute concerns. She is trying to maintain a heart healthy diet. Denies CP/palp/SOB/HA/congestion/fevers/GI or GU c/o. Taking meds as prescribed. Endorses some fatigue and worries about some urinary frequency and malodorous urine, no dysuria or hematuria.   Past Medical History  Diagnosis Date  . IBS (irritable bowel syndrome) 2010  . Osteopenia 2013  . Anal fissure   . UTI (lower urinary tract infection)   . Pneumonia   . Chicken pox as a child  . Measles as a child  . Mumps as a child  . Asthma     childhood?  . Elevated BP 11/10/2013  . Vitamin D deficiency 02/20/2015  . Headache 03/01/2015  . Preventative health care 09/01/2015  . Cervical cancer screening 09/01/2015  . Hypothyroidism 09/06/2015    Past Surgical History  Procedure Laterality Date  . Abdominal hysterectomy    . Pilonidal cyst excision    . Incise and drain abcess      right groin  . Breast surgery      left needle biopsy    Family History  Problem Relation Age of Onset  . Cancer Father     pancreatic   . Heart attack Brother   . Heart disease Brother     MI  . Cancer Mother     colon  . Hyperlipidemia Mother   . Glaucoma Mother   . Thyroid disease Mother   . Hypertension Mother   . Cataracts Sister   . Asthma Daughter   . Hypertension Daughter   . Cataracts Maternal Grandmother   . Alcohol abuse Maternal Grandfather   . Hyperlipidemia Sister   . Glaucoma Sister   . Arthritis Sister     rheumatoid  . Glaucoma Brother   . Hyperlipidemia Brother   . Hypertension Brother   . Other Brother     carotid artery disease  . Colitis Daughter   . Hyperlipidemia Daughter   .  Asthma Daughter     Social History   Social History  . Marital Status: Married    Spouse Name: N/A  . Number of Children: N/A  . Years of Education: N/A   Occupational History  . Not on file.   Social History Main Topics  . Smoking status: Never Smoker   . Smokeless tobacco: Never Used  . Alcohol Use: No  . Drug Use: No  . Sexual Activity: Yes    Birth Control/ Protection: Surgical     Comment: lives with husband and daughter, no dietary restrictions.   Other Topics Concern  . Not on file   Social History Narrative    Outpatient Prescriptions Prior to Visit  Medication Sig Dispense Refill  . ergocalciferol (VITAMIN D2) 50000 UNITS capsule Take 1 capsule (50,000 Units total) by mouth once a week. 4 capsule 4  . EPINEPHrine (EPIPEN 2-PAK) 0.3 mg/0.3 mL DEVI Inject 0.3 mLs (0.3 mg total) into the muscle once. (Patient not taking: Reported on 09/01/2015) 1 Device 3  . ALPRAZolam (XANAX) 0.25 MG tablet Take 1 tablet (0.25 mg total) by mouth daily as needed for anxiety. (Patient not  taking: Reported on 09/01/2015) 20 tablet 1  . meloxicam (MOBIC) 15 MG tablet Take 1 tablet (15 mg total) by mouth daily. 30 tablet 0  . traMADol (ULTRAM) 50 MG tablet Take 2 tablets (100 mg total) by mouth every 6 (six) hours as needed. 20 tablet 0   No facility-administered medications prior to visit.    Allergies  Allergen Reactions  . Bee Venom     Swelling localized  . Codeine   . Iodine   . Macrodantin     Review of Systems  Constitutional: Negative for fever and malaise/fatigue.  HENT: Negative for congestion.   Eyes: Negative for discharge.  Respiratory: Negative for shortness of breath.   Cardiovascular: Negative for chest pain, palpitations and leg swelling.  Gastrointestinal: Negative for nausea and abdominal pain.  Genitourinary: Negative for dysuria.  Musculoskeletal: Negative for falls.  Skin: Negative for rash.  Neurological: Negative for loss of consciousness and  headaches.  Endo/Heme/Allergies: Negative for environmental allergies.  Psychiatric/Behavioral: Negative for depression. The patient is not nervous/anxious.        Objective:    Physical Exam  Constitutional: She is oriented to person, place, and time. She appears well-developed and well-nourished. No distress.  HENT:  Head: Normocephalic and atraumatic.  Nose: Nose normal.  Eyes: Right eye exhibits no discharge. Left eye exhibits no discharge.  Neck: Normal range of motion. Neck supple.  Cardiovascular: Normal rate and regular rhythm.   No murmur heard. Pulmonary/Chest: Effort normal and breath sounds normal.  Abdominal: Soft. Bowel sounds are normal. There is no tenderness.  Musculoskeletal: She exhibits no edema.  Neurological: She is alert and oriented to person, place, and time. No cranial nerve deficit.  Skin: Skin is warm and dry.  Psychiatric: She has a normal mood and affect.  Nursing note and vitals reviewed.   BP 120/70 mmHg  Pulse 69  Temp(Src) 97.9 F (36.6 C) (Oral)  Ht  (1.6 m)  Wt 151 lb 8 oz (68.72 kg)  BMI 26.84 kg/m2  SpO2 95% Wt Readings from Last 3 Encounters:  09/01/15 151 lb 8 oz (68.72 kg)  02/20/15 150 lb 8 oz (68.266 kg)  10/31/14 160 lb (72.576 kg)     Lab Results  Component Value Date   WBC 7.4 09/01/2015   HGB 14.2 09/01/2015   HCT 43.7 09/01/2015   PLT 242.0 09/01/2015   GLUCOSE 86 09/01/2015   CHOL 197 09/01/2015   TRIG 51.0 09/01/2015   HDL 55.30 09/01/2015   LDLCALC 132* 09/01/2015   ALT 16 09/01/2015   AST 20 09/01/2015   NA 140 09/01/2015   K 3.6 09/01/2015   CL 105 09/01/2015   CREATININE 0.78 09/01/2015   BUN 22 09/01/2015   CO2 29 09/01/2015   TSH 7.96* 09/01/2015   HGBA1C 5.6 02/20/2015    Lab Results  Component Value Date   TSH 7.96* 09/01/2015   Lab Results  Component Value Date   WBC 7.4 09/01/2015   HGB 14.2 09/01/2015   HCT 43.7 09/01/2015   MCV 90.8 09/01/2015   PLT 242.0 09/01/2015   Lab  Results  Component Value Date   NA 140 09/01/2015   K 3.6 09/01/2015   CO2 29 09/01/2015   GLUCOSE 86 09/01/2015   BUN 22 09/01/2015   CREATININE 0.78 09/01/2015   BILITOT 0.4 09/01/2015   ALKPHOS 80 09/01/2015   AST 20 09/01/2015   ALT 16 09/01/2015   PROT 7.4 09/01/2015   ALBUMIN 4.1 09/01/2015  CALCIUM 8.8 09/01/2015   GFR 79.67 09/01/2015   Lab Results  Component Value Date   CHOL 197 09/01/2015   Lab Results  Component Value Date   HDL 55.30 09/01/2015   Lab Results  Component Value Date   LDLCALC 132* 09/01/2015   Lab Results  Component Value Date   TRIG 51.0 09/01/2015   Lab Results  Component Value Date   CHOLHDL 4 09/01/2015   Lab Results  Component Value Date   HGBA1C 5.6 02/20/2015       Assessment & Plan:   Problem List Items Addressed This Visit    Cervical cancer screening    Menarche at 11 Regular and moderate flow no history of abnormal pap in past G2P2, s/p 2 svd history of abnormal MGM concerns today, occasional dark discharge   gyn surgeries, bartholin cyst and pilonidal cyst removed. TAH and SPO      Relevant Orders   Cytology - PAP (Completed)   Elevated BP    Well controlled. Encouraged heart healthy diet such as the DASH diet and exercise as tolerated.       Relevant Orders   TSH (Completed)   CBC (Completed)   VITAMIN D 25 Hydroxy (Vit-D Deficiency, Fractures) (Completed)   Comprehensive metabolic panel (Completed)   Lipid panel (Completed)   TSH   CBC   Comprehensive metabolic panel   Hypothyroidism    Add Levothyroxine 25 mcg daily      Relevant Medications   levothyroxine (SYNTHROID) 25 MCG tablet   Other Relevant Orders   TSH   CBC   Comprehensive metabolic panel   Other abnormal glucose    minimize simple carbs. Increase exercise as tolerated.      Relevant Orders   TSH (Completed)   CBC (Completed)   VITAMIN D 25 Hydroxy (Vit-D Deficiency, Fractures) (Completed)   Comprehensive metabolic panel  (Completed)   Lipid panel (Completed)   Preventative health care - Primary    Patient encouraged to maintain heart healthy diet, regular exercise, adequate sleep. Consider daily probiotics. Take medications as prescribed. Labs reviewed. Follows with Dr Ambrose Mantle of Gynecology.       Relevant Orders   TSH (Completed)   CBC (Completed)   VITAMIN D 25 Hydroxy (Vit-D Deficiency, Fractures) (Completed)   Comprehensive metabolic panel (Completed)   Lipid panel (Completed)   Pure hypercholesterolemia    Encouraged heart healthy diet, increase exercise, avoid trans fats, consider a krill oil cap daily      Vitamin D deficiency    Labs reveal deficiency. Start on Vitamin D 16109 IU caps, 1 cap po weekly x 12 weeks. Disp #4 with 4 rf. Also take daily Vitamin D over the counter. If already taking a daily supplement increase by 1000 IU daily and if not start Vitamin D 2000 IU daily.       Relevant Orders   TSH (Completed)   CBC (Completed)   VITAMIN D 25 Hydroxy (Vit-D Deficiency, Fractures) (Completed)   Comprehensive metabolic panel (Completed)   Lipid panel (Completed)   VITAMIN D 25 Hydroxy (Vit-D Deficiency, Fractures)    Other Visit Diagnoses    Hyperlipidemia, mixed        Relevant Orders    TSH (Completed)    CBC (Completed)    VITAMIN D 25 Hydroxy (Vit-D Deficiency, Fractures) (Completed)    Comprehensive metabolic panel (Completed)    Lipid panel (Completed)    Lipid panel    Urinary incontinence, unspecified incontinence type  Relevant Orders    Urinalysis    Urine culture (Completed)    Vaginitis and vulvovaginitis        Relevant Orders    Cervicovaginal ancillary only (Completed)       I have discontinued Ms. Grill's meloxicam, traMADol, ALPRAZolam, and ergocalciferol. I am also having her start on levothyroxine and Vitamin D (Ergocalciferol). Additionally, I am having her maintain her EPINEPHrine and TURMERIC PO.  Meds ordered this encounter  Medications  .  TURMERIC PO    Sig: Take by mouth daily.  Marland Kitchen levothyroxine (SYNTHROID) 25 MCG tablet    Sig: Take 1 tablet (25 mcg total) by mouth daily before breakfast.    Dispense:  30 tablet    Refill:  3  . Vitamin D, Ergocalciferol, (DRISDOL) 50000 units CAPS capsule    Sig: Take 1 capsule (50,000 Units total) by mouth every 7 (seven) days.    Dispense:  4 capsule    Refill:  2     Danise Edge, MD

## 2015-09-01 NOTE — Assessment & Plan Note (Signed)
minimize simple carbs. Increase exercise as tolerated.  

## 2015-09-01 NOTE — Assessment & Plan Note (Signed)
Encouraged heart healthy diet, increase exercise, avoid trans fats, consider a krill oil cap daily 

## 2015-09-01 NOTE — Assessment & Plan Note (Signed)
Well controlled. Encouraged heart healthy diet such as the DASH diet and exercise as tolerated.  

## 2015-09-01 NOTE — Patient Instructions (Addendum)
Susan Terrell has a good Vitamin D supplements [email or web address]  Preventive Care for Adults, Female A healthy lifestyle and preventive care can promote health and wellness. Preventive health guidelines for women include the following key practices.  A routine yearly physical is a good way to check with your health care provider about your health and preventive screening. It is a chance to share any concerns and updates on your health and to receive a thorough exam.  Visit your dentist for a routine exam and preventive care every 6 months. Brush your teeth twice a day and floss once a day. Good oral hygiene prevents tooth decay and gum disease.  The frequency of eye exams is based on your age, health, family medical history, use of contact lenses, and other factors. Follow your health care provider's recommendations for frequency of eye exams.  Eat a healthy diet. Foods like vegetables, fruits, whole grains, low-fat dairy products, and lean protein foods contain the nutrients you need without too many calories. Decrease your intake of foods high in solid fats, added sugars, and salt. Eat the right amount of calories for you.Get information about a proper diet from your health care provider, if necessary.  Regular physical exercise is one of the most important things you can do for your health. Most adults should get at least 150 minutes of moderate-intensity exercise (any activity that increases your heart rate and causes you to sweat) each week. In addition, most adults need muscle-strengthening exercises on 2 or more days a week.  Maintain a healthy weight. The body mass index (BMI) is a screening tool to identify possible weight problems. It provides an estimate of body fat based on height and weight. Your health care provider can find your BMI and can help you achieve or maintain a healthy weight.For adults 20 years and older:  A BMI below 18.5 is considered underweight.  A BMI of 18.5 to  24.9 is normal.  A BMI of 25 to 29.9 is considered overweight.  A BMI of 30 and above is considered obese.  Maintain normal blood lipids and cholesterol levels by exercising and minimizing your intake of saturated fat. Eat a balanced diet with plenty of fruit and vegetables. Blood tests for lipids and cholesterol should begin at age 2 and be repeated every 5 years. If your lipid or cholesterol levels are high, you are over 50, or you are at high risk for heart disease, you may need your cholesterol levels checked more frequently.Ongoing high lipid and cholesterol levels should be treated with medicines if diet and exercise are not working.  If you smoke, find out from your health care provider how to quit. If you do not use tobacco, do not start.  Lung cancer screening is recommended for adults aged 59-80 years who are at high risk for developing lung cancer because of a history of smoking. A yearly low-dose CT scan of the lungs is recommended for people who have at least a 30-pack-year history of smoking and are a current smoker or have quit within the past 15 years. A pack year of smoking is smoking an average of 1 pack of cigarettes a day for 1 year (for example: 1 pack a day for 30 years or 2 packs a day for 15 years). Yearly screening should continue until the smoker has stopped smoking for at least 15 years. Yearly screening should be stopped for people who develop a health problem that would prevent them from having lung cancer treatment.  If  you are pregnant, do not drink alcohol. If you are breastfeeding, be very cautious about drinking alcohol. If you are not pregnant and choose to drink alcohol, do not have more than 1 drink per day. One drink is considered to be 12 ounces (355 mL) of beer, 5 ounces (148 mL) of wine, or 1.5 ounces (44 mL) of liquor.  Avoid use of street drugs. Do not share needles with anyone. Ask for help if you need support or instructions about stopping the use of  drugs.  High blood pressure causes heart disease and increases the risk of stroke. Your blood pressure should be checked at least every 1 to 2 years. Ongoing high blood pressure should be treated with medicines if weight loss and exercise do not work.  If you are 14-34 years old, ask your health care provider if you should take aspirin to prevent strokes.  Diabetes screening is done by taking a blood sample to check your blood glucose level after you have not eaten for a certain period of time (fasting). If you are not overweight and you do not have risk factors for diabetes, you should be screened once every 3 years starting at age 31. If you are overweight or obese and you are 24-48 years of age, you should be screened for diabetes every year as part of your cardiovascular risk assessment.  Breast cancer screening is essential preventive care for women. You should practice "breast self-awareness." This means understanding the normal appearance and feel of your breasts and may include breast self-examination. Any changes detected, no matter how small, should be reported to a health care provider. Women in their 65s and 30s should have a clinical breast exam (CBE) by a health care provider as part of a regular health exam every 1 to 3 years. After age 66, women should have a CBE every year. Starting at age 3, women should consider having a mammogram (breast X-ray test) every year. Women who have a family history of breast cancer should talk to their health care provider about genetic screening. Women at a high risk of breast cancer should talk to their health care providers about having an MRI and a mammogram every year.  Breast cancer gene (BRCA)-related cancer risk assessment is recommended for women who have family members with BRCA-related cancers. BRCA-related cancers include breast, ovarian, tubal, and peritoneal cancers. Having family members with these cancers may be associated with an increased  risk for harmful changes (mutations) in the breast cancer genes BRCA1 and BRCA2. Results of the assessment will determine the need for genetic counseling and BRCA1 and BRCA2 testing.  Your health care provider may recommend that you be screened regularly for cancer of the pelvic organs (ovaries, uterus, and vagina). This screening involves a pelvic examination, including checking for microscopic changes to the surface of your cervix (Pap test). You may be encouraged to have this screening done every 3 years, beginning at age 9.  For women ages 82-65, health care providers may recommend pelvic exams and Pap testing every 3 years, or they may recommend the Pap and pelvic exam, combined with testing for human papilloma virus (HPV), every 5 years. Some types of HPV increase your risk of cervical cancer. Testing for HPV may also be done on women of any age with unclear Pap test results.  Other health care providers may not recommend any screening for nonpregnant women who are considered low risk for pelvic cancer and who do not have symptoms. Ask your health  care provider if a screening pelvic exam is right for you.  If you have had past treatment for cervical cancer or a condition that could lead to cancer, you need Pap tests and screening for cancer for at least 20 years after your treatment. If Pap tests have been discontinued, your risk factors (such as having a new sexual partner) need to be reassessed to determine if screening should resume. Some women have medical problems that increase the chance of getting cervical cancer. In these cases, your health care provider may recommend more frequent screening and Pap tests.  Colorectal cancer can be detected and often prevented. Most routine colorectal cancer screening begins at the age of 29 years and continues through age 9 years. However, your health care provider may recommend screening at an earlier age if you have risk factors for colon cancer. On a  yearly basis, your health care provider may provide home test kits to check for hidden blood in the stool. Use of a small camera at the end of a tube, to directly examine the colon (sigmoidoscopy or colonoscopy), can detect the earliest forms of colorectal cancer. Talk to your health care provider about this at age 55, when routine screening begins. Direct exam of the colon should be repeated every 5-10 years through age 29 years, unless early forms of precancerous polyps or small growths are found.  People who are at an increased risk for hepatitis B should be screened for this virus. You are considered at high risk for hepatitis B if:  You were born in a country where hepatitis B occurs often. Talk with your health care provider about which countries are considered high risk.  Your parents were born in a high-risk country and you have not received a shot to protect against hepatitis B (hepatitis B vaccine).  You have HIV or AIDS.  You use needles to inject street drugs.  You live with, or have sex with, someone who has hepatitis B.  You get hemodialysis treatment.  You take certain medicines for conditions like cancer, organ transplantation, and autoimmune conditions.  Hepatitis C blood testing is recommended for all people born from [birth year] through 1965 and any individual with known risks for hepatitis C.  Practice safe sex. Use condoms and avoid high-risk sexual practices to reduce the spread of sexually transmitted infections (STIs). STIs include gonorrhea, chlamydia, syphilis, trichomonas, herpes, HPV, and human immunodeficiency virus (HIV). Herpes, HIV, and HPV are viral illnesses that have no cure. They can result in disability, cancer, and death.  You should be screened for sexually transmitted illnesses (STIs) including gonorrhea and chlamydia if:  You are sexually active and are younger than 24 years.  You are older than 24 years and your health care provider tells you that you are  at risk for this type of infection.  Your sexual activity has changed since you were last screened and you are at an increased risk for chlamydia or gonorrhea. Ask your health care provider if you are at risk.  If you are at risk of being infected with HIV, it is recommended that you take a prescription medicine daily to prevent HIV infection. This is called preexposure prophylaxis (PrEP). You are considered at risk if:  You are sexually active and do not regularly use condoms or know the HIV status of your partner(s).  You take drugs by injection.  You are sexually active with a partner who has HIV.  Talk with your health care provider about whether you  are at high risk of being infected with HIV. If you choose to begin PrEP, you should first be tested for HIV. You should then be tested every 3 months for as long as you are taking PrEP.  Osteoporosis is a disease in which the bones lose minerals and strength with aging. This can result in serious bone fractures or breaks. The risk of osteoporosis can be identified using a bone density scan. Women ages 22 years and over and women at risk for fractures or osteoporosis should discuss screening with their health care providers. Ask your health care provider whether you should take a calcium supplement or vitamin D to reduce the rate of osteoporosis.  Menopause can be associated with physical symptoms and risks. Hormone replacement therapy is available to decrease symptoms and risks. You should talk to your health care provider about whether hormone replacement therapy is right for you.  Use sunscreen. Apply sunscreen liberally and repeatedly throughout the day. You should seek shade when your shadow is shorter than you. Protect yourself by wearing long sleeves, pants, a wide-brimmed hat, and sunglasses year round, whenever you are outdoors.  Once a month, do a whole body skin exam, using a mirror to look at the skin on your back. Tell your health  care provider of new moles, moles that have irregular borders, moles that are larger than a pencil eraser, or moles that have changed in shape or color.  Stay current with required vaccines (immunizations).  Influenza vaccine. All adults should be immunized every year.  Tetanus, diphtheria, and acellular pertussis (Td, Tdap) vaccine. Pregnant women should receive 1 dose of Tdap vaccine during each pregnancy. The dose should be obtained regardless of the length of time since the last dose. Immunization is preferred during the 27th-36th week of gestation. An adult who has not previously received Tdap or who does not know her vaccine status should receive 1 dose of Tdap. This initial dose should be followed by tetanus and diphtheria toxoids (Td) booster doses every 10 years. Adults with an unknown or incomplete history of completing a 3-dose immunization series with Td-containing vaccines should begin or complete a primary immunization series including a Tdap dose. Adults should receive a Td booster every 10 years.  Varicella vaccine. An adult without evidence of immunity to varicella should receive 2 doses or a second dose if she has previously received 1 dose. Pregnant females who do not have evidence of immunity should receive the first dose after pregnancy. This first dose should be obtained before leaving the health care facility. The second dose should be obtained 4-8 weeks after the first dose.  Human papillomavirus (HPV) vaccine. Females aged 13-26 years who have not received the vaccine previously should obtain the 3-dose series. The vaccine is not recommended for use in pregnant females. However, pregnancy testing is not needed before receiving a dose. If a female is found to be pregnant after receiving a dose, no treatment is needed. In that case, the remaining doses should be delayed until after the pregnancy. Immunization is recommended for any person with an immunocompromised condition through  the age of 30 years if she did not get any or all doses earlier. During the 3-dose series, the second dose should be obtained 4-8 weeks after the first dose. The third dose should be obtained 24 weeks after the first dose and 16 weeks after the second dose.  Zoster vaccine. One dose is recommended for adults aged 59 years or older unless certain conditions are  present.  Measles, mumps, and rubella (MMR) vaccine. Adults born before [birth year] generally are considered immune to measles and mumps. Adults born in [birth year] or later should have 1 or more doses of MMR vaccine unless there is a contraindication to the vaccine or there is laboratory evidence of immunity to each of the three diseases. A routine second dose of MMR vaccine should be obtained at least 28 days after the first dose for students attending postsecondary schools, health care workers, or international travelers. People who received inactivated measles vaccine or an unknown type of measles vaccine during 1963-1967 should receive 2 doses of MMR vaccine. People who received inactivated mumps vaccine or an unknown type of mumps vaccine before 1979 and are at high risk for mumps infection should consider immunization with 2 doses of MMR vaccine. For females of childbearing age, rubella immunity should be determined. If there is no evidence of immunity, females who are not pregnant should be vaccinated. If there is no evidence of immunity, females who are pregnant should delay immunization until after pregnancy. Unvaccinated health care workers born before [birth year] who lack laboratory evidence of measles, mumps, or rubella immunity or laboratory confirmation of disease should consider measles and mumps immunization with 2 doses of MMR vaccine or rubella immunization with 1 dose of MMR vaccine.  Pneumococcal 13-valent conjugate (PCV13) vaccine. When indicated, a person who is uncertain of his immunization history and has no record of immunization should receive the  PCV13 vaccine. All adults 82 years of age and older should receive this vaccine. An adult aged 80 years or older who has certain medical conditions and has not been previously immunized should receive 1 dose of PCV13 vaccine. This PCV13 should be followed with a dose of pneumococcal polysaccharide (PPSV23) vaccine. Adults who are at high risk for pneumococcal disease should obtain the PPSV23 vaccine at least 8 weeks after the dose of PCV13 vaccine. Adults older than 62 years of age who have normal immune system function should obtain the PPSV23 vaccine dose at least 1 year after the dose of PCV13 vaccine.  Pneumococcal polysaccharide (PPSV23) vaccine. When PCV13 is also indicated, PCV13 should be obtained first. All adults aged 6 years and older should be immunized. An adult younger than age 61 years who has certain medical conditions should be immunized. Any person who resides in a nursing home or long-term care facility should be immunized. An adult smoker should be immunized. People with an immunocompromised condition and certain other conditions should receive both PCV13 and PPSV23 vaccines. People with human immunodeficiency virus (HIV) infection should be immunized as soon as possible after diagnosis. Immunization during chemotherapy or radiation therapy should be avoided. Routine use of PPSV23 vaccine is not recommended for American Indians, [place] Natives, or people younger than 65 years unless there are medical conditions that require PPSV23 vaccine. When indicated, people who have unknown immunization and have no record of immunization should receive PPSV23 vaccine. One-time revaccination 5 years after the first dose of PPSV23 is recommended for people aged 19-64 years who have chronic kidney failure, nephrotic syndrome, asplenia, or immunocompromised conditions. People who received 1-2 doses of PPSV23 before age 33 years should receive another dose of PPSV23 vaccine at age 24 years or later if at least  5 years have passed since the previous dose. Doses of PPSV23 are not needed for people immunized with PPSV23 at or after age 8 years.  Meningococcal vaccine. Adults with asplenia or persistent complement component deficiencies should receive 2 doses of  quadrivalent meningococcal conjugate (MenACWY-D) vaccine. The doses should be obtained at least 2 months apart. Microbiologists working with certain meningococcal bacteria, Needles recruits, people at risk during an outbreak, and people who travel to or live in countries with a high rate of meningitis should be immunized. A first-year college student up through age 25 years who is living in a residence hall should receive a dose if she did not receive a dose on or after her 16th birthday. Adults who have certain high-risk conditions should receive one or more doses of vaccine.  Hepatitis A vaccine. Adults who wish to be protected from this disease, have certain high-risk conditions, work with hepatitis A-infected animals, work in hepatitis A research labs, or travel to or work in countries with a high rate of hepatitis A should be immunized. Adults who were previously unvaccinated and who anticipate close contact with an international adoptee during the first 60 days after arrival in the [place] from a country with a high rate of hepatitis A should be immunized.  Hepatitis B vaccine. Adults who wish to be protected from this disease, have certain high-risk conditions, may be exposed to blood or other infectious body fluids, are household contacts or sex partners of hepatitis B positive people, are clients or workers in certain care facilities, or travel to or work in countries with a high rate of hepatitis B should be immunized.  Haemophilus influenzae type b (Hib) vaccine. A previously unvaccinated person with asplenia or sickle cell disease or having a scheduled splenectomy should receive 1 dose of Hib vaccine. Regardless of previous immunization, a  recipient of a hematopoietic stem cell transplant should receive a 3-dose series 6-12 months after her successful transplant. Hib vaccine is not recommended for adults with HIV infection. Preventive Services / Frequency Ages 9 to 56 years  Blood pressure check.** / Every 3-5 years.  Lipid and cholesterol check.** / Every 5 years beginning at age 58.  Clinical breast exam.** / Every 3 years for women in their 3s and 83s.  BRCA-related cancer risk assessment.** / For women who have family members with a BRCA-related cancer (breast, ovarian, tubal, or peritoneal cancers).  Pap test.** / Every 2 years from ages 24 through 33. Every 3 years starting at age 93 through age 10 or 43 with a history of 3 consecutive normal Pap tests.  HPV screening.** / Every 3 years from ages 15 through ages 79 to 35 with a history of 3 consecutive normal Pap tests.  Hepatitis C blood test.** / For any individual with known risks for hepatitis C.  Skin self-exam. / Monthly.  Influenza vaccine. / Every year.  Tetanus, diphtheria, and acellular pertussis (Tdap, Td) vaccine.** / Consult your health care provider. Pregnant women should receive 1 dose of Tdap vaccine during each pregnancy. 1 dose of Td every 10 years.  Varicella vaccine.** / Consult your health care provider. Pregnant females who do not have evidence of immunity should receive the first dose after pregnancy.  HPV vaccine. / 3 doses over 6 months, if 51 and younger. The vaccine is not recommended for use in pregnant females. However, pregnancy testing is not needed before receiving a dose.  Measles, mumps, rubella (MMR) vaccine.** / You need at least 1 dose of MMR if you were born in 1957 or later. You may also need a 2nd dose. For females of childbearing age, rubella immunity should be determined. If there is no evidence of immunity, females who are not pregnant should be vaccinated.  If there is no evidence of immunity, females who are pregnant  should delay immunization until after pregnancy.  Pneumococcal 13-valent conjugate (PCV13) vaccine.** / Consult your health care provider.  Pneumococcal polysaccharide (PPSV23) vaccine.** / 1 to 2 doses if you smoke cigarettes or if you have certain conditions.  Meningococcal vaccine.** / 1 dose if you are age 21 to 52 years and a Market researcher living in a residence hall, or have one of several medical conditions, you need to get vaccinated against meningococcal disease. You may also need additional booster doses.  Hepatitis A vaccine.** / Consult your health care provider.  Hepatitis B vaccine.** / Consult your health care provider.  Haemophilus influenzae type b (Hib) vaccine.** / Consult your health care provider. Ages 41 to 38 years  Blood pressure check.** / Every year.  Lipid and cholesterol check.** / Every 5 years beginning at age 29 years.  Lung cancer screening. / Every year if you are aged 69-80 years and have a 30-pack-year history of smoking and currently smoke or have quit within the past 15 years. Yearly screening is stopped once you have quit smoking for at least 15 years or develop a health problem that would prevent you from having lung cancer treatment.  Clinical breast exam.** / Every year after age 35 years.  BRCA-related cancer risk assessment.** / For women who have family members with a BRCA-related cancer (breast, ovarian, tubal, or peritoneal cancers).  Mammogram.** / Every year beginning at age 38 years and continuing for as long as you are in good health. Consult with your health care provider.  Pap test.** / Every 3 years starting at age 39 years through age 8 or 70 years with a history of 3 consecutive normal Pap tests.  HPV screening.** / Every 3 years from ages 109 years through ages 30 to 61 years with a history of 3 consecutive normal Pap tests.  Fecal occult blood test (FOBT) of stool. / Every year beginning at age 57 years and  continuing until age 79 years. You may not need to do this test if you get a colonoscopy every 10 years.  Flexible sigmoidoscopy or colonoscopy.** / Every 5 years for a flexible sigmoidoscopy or every 10 years for a colonoscopy beginning at age 35 years and continuing until age 8 years.  Hepatitis C blood test.** / For all people born from [birth year] through 1967 and any individual with known risks for hepatitis C.  Skin self-exam. / Monthly.  Influenza vaccine. / Every year.  Tetanus, diphtheria, and acellular pertussis (Tdap/Td) vaccine.** / Consult your health care provider. Pregnant women should receive 1 dose of Tdap vaccine during each pregnancy. 1 dose of Td every 10 years.  Varicella vaccine.** / Consult your health care provider. Pregnant females who do not have evidence of immunity should receive the first dose after pregnancy.  Zoster vaccine.** / 1 dose for adults aged 61 years or older.  Measles, mumps, rubella (MMR) vaccine.** / You need at least 1 dose of MMR if you were born in 1957 or later. You may also need a second dose. For females of childbearing age, rubella immunity should be determined. If there is no evidence of immunity, females who are not pregnant should be vaccinated. If there is no evidence of immunity, females who are pregnant should delay immunization until after pregnancy.  Pneumococcal 13-valent conjugate (PCV13) vaccine.** / Consult your health care provider.  Pneumococcal polysaccharide (PPSV23) vaccine.** / 1 to 2 doses if you smoke cigarettes  or if you have certain conditions.  Meningococcal vaccine.** / Consult your health care provider.  Hepatitis A vaccine.** / Consult your health care provider.  Hepatitis B vaccine.** / Consult your health care provider.  Haemophilus influenzae type b (Hib) vaccine.** / Consult your health care provider. Ages 85 years and over  Blood pressure check.** / Every year.  Lipid and cholesterol check.** / Every 5 years  beginning at age 16 years.  Lung cancer screening. / Every year if you are aged 63-80 years and have a 30-pack-year history of smoking and currently smoke or have quit within the past 15 years. Yearly screening is stopped once you have quit smoking for at least 15 years or develop a health problem that would prevent you from having lung cancer treatment.  Clinical breast exam.** / Every year after age 89 years.  BRCA-related cancer risk assessment.** / For women who have family members with a BRCA-related cancer (breast, ovarian, tubal, or peritoneal cancers).  Mammogram.** / Every year beginning at age 60 years and continuing for as long as you are in good health. Consult with your health care provider.  Pap test.** / Every 3 years starting at age 63 years through age 55 or 53 years with 3 consecutive normal Pap tests. Testing can be stopped between 65 and 70 years with 3 consecutive normal Pap tests and no abnormal Pap or HPV tests in the past 10 years.  HPV screening.** / Every 3 years from ages 91 years through ages 5 or 90 years with a history of 3 consecutive normal Pap tests. Testing can be stopped between 65 and 70 years with 3 consecutive normal Pap tests and no abnormal Pap or HPV tests in the past 10 years.  Fecal occult blood test (FOBT) of stool. / Every year beginning at age 7 years and continuing until age 52 years. You may not need to do this test if you get a colonoscopy every 10 years.  Flexible sigmoidoscopy or colonoscopy.** / Every 5 years for a flexible sigmoidoscopy or every 10 years for a colonoscopy beginning at age 76 years and continuing until age 1 years.  Hepatitis C blood test.** / For all people born from [birth year] through 1967 and any individual with known risks for hepatitis C.  Osteoporosis screening.** / A one-time screening for women ages 24 years and over and women at risk for fractures or osteoporosis.  Skin self-exam. / Monthly.  Influenza vaccine. /  Every year.  Tetanus, diphtheria, and acellular pertussis (Tdap/Td) vaccine.** / 1 dose of Td every 10 years.  Varicella vaccine.** / Consult your health care provider.  Zoster vaccine.** / 1 dose for adults aged 35 years or older.  Pneumococcal 13-valent conjugate (PCV13) vaccine.** / Consult your health care provider.  Pneumococcal polysaccharide (PPSV23) vaccine.** / 1 dose for all adults aged 36 years and older.  Meningococcal vaccine.** / Consult your health care provider.  Hepatitis A vaccine.** / Consult your health care provider.  Hepatitis B vaccine.** / Consult your health care provider.  Haemophilus influenzae type b (Hib) vaccine.** / Consult your health care provider. ** Family history and personal history of risk and conditions may change your health care provider's recommendations.   This information is not intended to replace advice given to you by your health care provider. Make sure you discuss any questions you have with your health care provider.   Document Released: 09/27/2001 Document Revised: 08/22/2014 Document Reviewed: 12/27/2010 Elsevier Interactive Patient Education [employer].

## 2015-09-01 NOTE — Assessment & Plan Note (Signed)
Menarche at 11 Regular and moderate flow no history of abnormal pap in past G2P2, s/p 2 svd history of abnormal MGM concerns today, occasional dark discharge   gyn surgeries, bartholin cyst and pilonidal cyst removed. TAH and SPO

## 2015-09-02 LAB — URINE CULTURE
COLONY COUNT: NO GROWTH
Organism ID, Bacteria: NO GROWTH

## 2015-09-02 MED ORDER — VITAMIN D (ERGOCALCIFEROL) 1.25 MG (50000 UNIT) PO CAPS: 50000.0000 [IU] | ORAL_CAPSULE | ORAL | Status: DC

## 2015-09-02 MED ORDER — LEVOTHYROXINE SODIUM 25 MCG PO TABS: 25.0000 ug | ORAL_TABLET | Freq: Every day | ORAL | Status: DC

## 2015-09-03 LAB — CYTOLOGY - PAP

## 2015-09-04 LAB — CERVICOVAGINAL ANCILLARY ONLY
BACTERIAL VAGINITIS: NEGATIVE
Candida vaginitis: NEGATIVE

## 2015-09-06 ENCOUNTER — Encounter: Payer: Self-pay | Admitting: Family Medicine

## 2015-09-06 DIAGNOSIS — E039 Hypothyroidism, unspecified: Secondary | ICD-10-CM

## 2015-09-06 HISTORY — DX: Hypothyroidism, unspecified: E03.9

## 2015-09-06 NOTE — Assessment & Plan Note (Signed)
Add Levothyroxine 25 mcg daily

## 2015-09-06 NOTE — Assessment & Plan Note (Signed)
Patient encouraged to maintain heart healthy diet, regular exercise, adequate sleep. Consider daily probiotics. Take medications as prescribed. Labs reviewed. Follows with Dr Ambrose Mantle of Gynecology.

## 2015-09-29 ENCOUNTER — Telehealth: Payer: Self-pay | Admitting: Family Medicine

## 2015-09-29 NOTE — Telephone Encounter (Signed)
Relation to ZO:XWRU Call back number:[id number] Pharmacy:  [facility] [address] [facility], [place] - [address] AT [facility] [address] [facility] [address] [phone] (Phone) [phone] (Fax)         Reason for call:  Patient experiencing sore throat, chills, no fever and runny nose requesting a RX . Patient would like to speak with nurse directly

## 2015-09-29 NOTE — Telephone Encounter (Signed)
Please send her in Tamiflu if she would like. 75 mg po bid x 5 days.

## 2015-09-29 NOTE — Telephone Encounter (Signed)
Spoke to the patient she is having chills, runny nose, no fever and sore throat. (Patient is the daughter of Halina Andreas and she is in hospice care). Patient was exposed to a granddaughter (62 yrs old) on Sunday, Monday took to the pediatrician and she tested postive for flu. The patient would like to know what to take OTC/APPt. ??

## 2015-09-30 ENCOUNTER — Ambulatory Visit (INDEPENDENT_AMBULATORY_CARE_PROVIDER_SITE_OTHER): Payer: 59 | Admitting: Family Medicine

## 2015-09-30 VITALS — BP 132/60 | HR 66 | Temp 97.9°F | Resp 16 | Ht 62.0 in | Wt 152.2 lb

## 2015-09-30 DIAGNOSIS — Z20818 Contact with and (suspected) exposure to other bacterial communicable diseases: Secondary | ICD-10-CM

## 2015-09-30 DIAGNOSIS — R05 Cough: Secondary | ICD-10-CM | POA: Diagnosis not present

## 2015-09-30 DIAGNOSIS — J029 Acute pharyngitis, unspecified: Secondary | ICD-10-CM | POA: Diagnosis not present

## 2015-09-30 DIAGNOSIS — Z2089 Contact with and (suspected) exposure to other communicable diseases: Secondary | ICD-10-CM | POA: Diagnosis not present

## 2015-09-30 DIAGNOSIS — J0100 Acute maxillary sinusitis, unspecified: Secondary | ICD-10-CM | POA: Diagnosis not present

## 2015-09-30 DIAGNOSIS — R059 Cough, unspecified: Secondary | ICD-10-CM

## 2015-09-30 LAB — POCT RAPID STREP A (OFFICE): Rapid Strep A Screen: NEGATIVE

## 2015-09-30 MED ORDER — BENZONATATE 100 MG PO CAPS: 200.0000 mg | ORAL_CAPSULE | Freq: Two times a day (BID) | ORAL | Status: DC | PRN

## 2015-09-30 MED ORDER — AMOXICILLIN-POT CLAVULANATE 875-125 MG PO TABS: 1.0000 | ORAL_TABLET | Freq: Two times a day (BID) | ORAL | Status: DC

## 2015-09-30 NOTE — Progress Notes (Signed)
Chief Complaint:  Chief Complaint  Patient presents with  . Chills  . Sore Throat  . Cough    Productive, Green    HPI: Susan Terrell is a 60 y.o. female who reports to [facility] today complaining of 1 week hx of feeling poorly, has not had flu vaccine. Had sore throat, cough, productive, some chills, right sided facial pain and also ear gfullness. 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 Was expose to GD with strep and coworkers with URI and laso flu. HAs tried otc meds, has allergies. No oreleif.   Past Medical History  Diagnosis Date  . IBS (irritable bowel syndrome) 2010  . Osteopenia 2013  . Anal fissure   . UTI (lower urinary tract infection)   . Pneumonia   . Chicken pox as a child  . Measles as a child  . Mumps as a child  . Asthma     childhood?  . Elevated BP 11/10/2013  . Vitamin D deficiency 02/20/2015  . Headache 03/01/2015  . Preventative health care 09/01/2015  . Cervical cancer screening 09/01/2015  . Hypothyroidism 09/06/2015   Past Surgical History  Procedure Laterality Date  . Abdominal hysterectomy    . Pilonidal cyst excision    . Incise and drain abcess      right groin  . Breast surgery      left needle biopsy   Social History   Social History  . Marital Status: Married    Spouse Name: N/A  . Number of Children: N/A  . Years of Education: N/A   Social History Main Topics  . Smoking status: Never Smoker   . Smokeless tobacco: Never Used  . Alcohol Use: No  . Drug Use: No  . Sexual Activity: Yes    Birth Control/ Protection: Surgical     Comment: lives with husband and daughter, no dietary restrictions.   Other Topics Concern  . None   Social History Narrative   Family History  Problem Relation Age of Onset  . Cancer Father     pancreatic   . Heart attack Brother   . Heart disease Brother     MI  . Cancer Mother     colon  . Hyperlipidemia Mother   . Glaucoma Mother   . Thyroid disease Mother   .  Hypertension Mother   . Cataracts Sister   . Asthma Daughter   . Hypertension Daughter   . Cataracts Maternal Grandmother   . Alcohol abuse Maternal Grandfather   . Hyperlipidemia Sister   . Glaucoma Sister   . Arthritis Sister     rheumatoid  . Glaucoma Brother   . Hyperlipidemia Brother   . Hypertension Brother   . Other Brother     carotid artery disease  . Colitis Daughter   . Hyperlipidemia Daughter   . Asthma Daughter    Allergies  Allergen Reactions  . Bee Venom     Swelling localized  . Codeine   . Iodine   . Macrodantin    Prior to Admission medications   Medication Sig Start Date End Date Taking? Authorizing Provider  levothyroxine (SYNTHROID) 25 MCG tablet Take 1 tablet (25 mcg total) by mouth daily before breakfast. 09/02/15  Yes Bradd Canary, MD  Vitamin D, Ergocalciferol, (DRISDOL) 50000 units CAPS capsule Take 1 capsule (50,000 Units total) by mouth every 7 (seven) days. 09/02/15  Yes Bradd Canary, MD  EPINEPHrine [facility]  2-PAK) 0.3 mg/0.3 mL DEVI Inject 0.3 mLs (0.3 mg total) into the muscle once. Patient not taking: Reported on 09/01/2015 10/21/11   Etta Grandchild, MD  TURMERIC PO Take by mouth daily. Reported on 09/30/2015    Historical Provider, MD     ROS: The patient denies fevers, night sweats, unintentional weight loss, chest pain, palpitations, wheezing, dyspnea on exertion, nausea, vomiting, abdominal pain, dysuria, hematuria, melena, numbness, weakness, or tingling.   All other systems have been reviewed and were otherwise negative with the exception of those mentioned in the HPI and as above.    PHYSICAL EXAM: Filed Vitals:   09/30/15 0816  BP: 132/60  Pulse: 66  Temp: 97.9 F (36.6 C)  Resp: 16   Body mass index is 27.83 kg/(m^2).   General: Alert, no acute distress HEENT:  Normocephalic, atraumatic, oropharynx patent. EOMI, PERRLA Erythematous throat, no exudates, TM normal, + right max sinus tenderness, + erythematous/boggy nasal  mucosa Cardiovascular:  Regular rate and rhythm, no rubs murmurs or gallops.  No Carotid bruits, radial pulse intact. No pedal edema.  Respiratory: Clear to auscultation bilaterally.  No wheezes, rales, or rhonchi.  No cyanosis, no use of accessory musculature Abdominal: No organomegaly, abdomen is soft and non-tender, positive bowel sounds. No masses. Skin: No rashes. Neurologic: Facial musculature symmetric. Psychiatric: Patient acts appropriately throughout our interaction. Lymphatic: No cervical or submandibular lymphadenopathy Musculoskeletal: Gait intact. No edema, tenderness   LABS: Results for orders placed or performed in visit on 09/30/15  POCT rapid strep A  Result Value Ref Range   Rapid Strep A Screen Negative Negative     EKG/XRAY:   Primary read interpreted by Dr. Conley Rolls at [facility].   ASSESSMENT/PLAN: Encounter Diagnoses  Name Primary?  . Acute pharyngitis, unspecified pharyngitis type   . Acute maxillary sinusitis, recurrence not specified Yes  . Strep throat exposure   . Cough    Throat cx pending Rx augmentin, tessalon perles Otc nasacort, if she has flu sxs call me and I will give tamiflu sine she is primary care provider Fu prn   Gross sideeffects, risk and benefits, and alternatives of medications d/w patient. Patient is aware that all medications have potential sideeffects and we are unable to predict every sideeffect or drug-drug interaction that may occur.  Kynnadi Dicenso DO  09/30/2015 8:55 AM

## 2015-10-02 LAB — CULTURE, GROUP A STREP: Organism ID, Bacteria: NORMAL

## 2015-10-02 NOTE — Telephone Encounter (Signed)
Patient was seen at [facility] [address] on 09/30/2015. She was put on an antibiotic and tessalon cough pills for cough. States she is doing much better and has no need for tamiflu

## 2015-10-04 ENCOUNTER — Encounter: Payer: Self-pay | Admitting: *Deleted

## 2015-10-13 ENCOUNTER — Telehealth: Payer: Self-pay

## 2015-10-13 NOTE — Telephone Encounter (Signed)
Time: 4:16pm  While on the phone with patient about her mother, she requested an appt.  She says that off and on she has been having chest pressure in the middle of her chest between her breast.  She denied symptoms today, but says that when she has had symptoms, it is typically associated with pain in her arms and face and that the pain increases whenever she takes a deep breath.  Pain typically happens at night or all of sudden.  She's concerned about potential heart problems and therefore requesting an appt.    Advice: Appt scheduled with Esperanza Richters, PA-C tomorrow at 8:30 am.  She advised if symptoms return to go to ER.  Pt stated understanding and agreed to comply.    Spoke with [place] and he agreed with advice.  Message routed to [facility], PA-C and Dr. Abner Greenspan for FYI.

## 2015-10-14 ENCOUNTER — Ambulatory Visit (INDEPENDENT_AMBULATORY_CARE_PROVIDER_SITE_OTHER): Payer: 59 | Admitting: Physician Assistant

## 2015-10-14 ENCOUNTER — Encounter: Payer: Self-pay | Admitting: Medical

## 2015-10-14 ENCOUNTER — Encounter: Payer: Self-pay | Admitting: Physician Assistant

## 2015-10-14 ENCOUNTER — Ambulatory Visit (INDEPENDENT_AMBULATORY_CARE_PROVIDER_SITE_OTHER): Payer: 59 | Admitting: Medical

## 2015-10-14 VITALS — BP 120/70 | HR 67 | Temp 98.1°F | Ht 62.0 in | Wt 152.6 lb

## 2015-10-14 VITALS — BP 122/88 | HR 66 | Ht 62.0 in | Wt 152.8 lb

## 2015-10-14 DIAGNOSIS — R079 Chest pain, unspecified: Secondary | ICD-10-CM

## 2015-10-14 DIAGNOSIS — J0100 Acute maxillary sinusitis, unspecified: Secondary | ICD-10-CM | POA: Diagnosis not present

## 2015-10-14 DIAGNOSIS — R0789 Other chest pain: Secondary | ICD-10-CM

## 2015-10-14 DIAGNOSIS — J309 Allergic rhinitis, unspecified: Secondary | ICD-10-CM | POA: Diagnosis not present

## 2015-10-14 LAB — TROPONIN I: TNIDX: 0.01 ug/l (ref 0.00–0.06)

## 2015-10-14 MED ORDER — AZITHROMYCIN 250 MG PO TABS: ORAL_TABLET | ORAL | Status: DC

## 2015-10-14 MED ORDER — AZELASTINE HCL 0.1 % NA SOLN: 2.0000 | Freq: Two times a day (BID) | NASAL | Status: DC

## 2015-10-14 NOTE — Patient Instructions (Addendum)
For your chest discomfort history(not present now). We did ekg. With your pain and some risk factors will refer you to cardiologist. Will also get troponin test today. If any recurrent pain as described before prior to cardiologist then emergency dept evaluation.  For you sinus pressure, nasal congestion and productive cough, I think you may have early allergies this year with possible sinus infection. Continue you flonase and will add astelin nasal spray. Also rx of azithromycin antibiotic.  Follow up in 7 days or as needed.

## 2015-10-14 NOTE — Addendum Note (Signed)
Addended by: Verne Carrow D on: 10/14/2015 05:01 PM   Modules accepted: Level of Service

## 2015-10-14 NOTE — Progress Notes (Signed)
Pre visit review using our clinic review tool, if applicable. No additional management support is needed unless otherwise documented below in the visit note. 

## 2015-10-14 NOTE — Progress Notes (Signed)
Cardiology Office Note   Date:  10/14/2015   ID:  Susan Terrell, DOB [birth year], MRN [id number]  PCP:  Danise Edge, MD  Cardiologist:  [place], Dr. Margart Sickles, PA-C   No chief complaint on file.   History of Present Illness: Susan Terrell is a 60 y.o. female with a history of IBS, remote PNA, HTN, hypothyroid, mixed HLD.  No hx DM, tobacco use.   09/30/2015: Seen in primary care office and rx given for URI, Augmentin and Tessalon Perles.  10/14/2015: seen in primary care office for intermittent chest pain.   Susan Terrell presents for evaluation of her chest pain.  For a long time, she has had random discomfort. Every 2 or 3 weeks she would have an episode that was started with right-sided facial numbness, right arm aching and little weak. Her right arm but also have some mild numbness that would occasionally extend into her right leg. Symptoms would last on the first day. This would be associated with midsternal chest pain that she describes as a cyst pressing into her chest. The symptoms were not exertional.  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 Her mother has been chronically ill for a long time, and she and her sisters take turns staying with her on nights in weekends. She has had 2 days off in the last year. In the middle of all of this, her husband had a heart attack. She sleeps poorly and does not feel rested when she wakes up.  In the last few weeks, ever since she was placed on Augmentin for an upper respiratory action, she has had more recent episodes of chest pain.  The episodes of chest pain are all the same. She feels that there is a cyst in the center of her chest. At first, she was only having it when she had the arm pain. However, recently she has begun having it at times when she is not having the arm pain. It only lasts for a few minutes. Initially a 6 or 7/10. She feels like she is having some trouble getting  a deep breath. She feels a little clammy and flushed, but there is no nausea or vomiting. She has had no fevers. She will sit down and try to relax and the stress, and the pain will go away. There is not generally an exertional component. However, today when she was at the primary care office, she walked up a flight of stairs to see if she would get the pain. She thought she had a little bit of discomfort at the top but did not have to stop. She did not feel particularly short of breath with this. She is not currently having any discomfort at all.   Past Medical History  Diagnosis Date  . IBS (irritable bowel syndrome) 2010  . Osteopenia 2013  . Anal fissure   . UTI (lower urinary tract infection)   . Pneumonia   . Chicken pox as a child  . Measles as a child  . Mumps as a child  . Asthma     childhood?  . Elevated BP 11/10/2013  . Vitamin D deficiency 02/20/2015  . Headache 03/01/2015  . Preventative health care 09/01/2015  . Cervical cancer screening 09/01/2015  . Hypothyroidism 09/06/2015    Past Surgical History  Procedure Laterality Date  . Abdominal hysterectomy    . Pilonidal cyst excision    .  Incise and drain abcess      right groin  . Breast surgery      left needle biopsy    Current Outpatient Prescriptions  Medication Sig Dispense Refill  . azelastine (ASTELIN) 0.1 % nasal spray Place 2 sprays into both nostrils 2 (two) times daily. Use in each nostril as directed 30 mL 2  . azithromycin (ZITHROMAX) 250 MG tablet Take 2 tablets by mouth on day 1, followed by 1 tablet by mouth daily for 4 days. 6 tablet 0  . EPINEPHrine (EPIPEN 2-PAK) 0.3 mg/0.3 mL DEVI Inject 0.3 mLs (0.3 mg total) into the muscle once. 1 Device 3  . levothyroxine (SYNTHROID) 25 MCG tablet Take 1 tablet (25 mcg total) by mouth daily before breakfast. 30 tablet 3  . TURMERIC PO Take 1 tablet by mouth daily. Reported on 09/30/2015    . Vitamin D, Ergocalciferol, (DRISDOL) 50000 units CAPS capsule Take 1  capsule (50,000 Units total) by mouth every 7 (seven) days. 4 capsule 2   No current facility-administered medications for this visit.    Allergies:   Bee venom; Codeine; Iodine; and Macrodantin    Social History:  The patient  reports that she has never smoked. She has never used smokeless tobacco. She reports that she does not drink alcohol or use illicit drugs.   Family History:  The patient's family history includes Alcohol abuse in her maternal grandfather; Arthritis in her sister; Asthma in her daughter and daughter; Cancer in her father and mother; Cataracts in her maternal grandmother and sister; Colitis in her daughter; Glaucoma in her brother, mother, and sister; Heart attack in her brother; Hyperlipidemia in her brother, daughter, mother, and sister; Hypertension in her brother, daughter, and mother; Other in her brother; Thyroid disease in her mother.    ROS:  Please see the history of present illness. All other systems are reviewed and negative.    PHYSICAL EXAM: VS:  BP 122/88 mmHg  Pulse 66  Ht  (1.575 m)  Wt 152 lb 12.8 oz (69.31 kg)  BMI 27.94 kg/m2 , BMI Body mass index is 27.94 kg/(m^2). GEN: Well nourished, well developed, female in no acute distress HEENT: normal for age  Neck: no JVD, no carotid bruit, no masses Cardiac: RRR; no murmur, no rubs, or gallops Respiratory:  clear to auscultation bilaterally, normal work of breathing GI: soft, nontender, nondistended, + BS MS: no deformity or atrophy; no edema; distal pulses are 2+ in all 4 extremities  Skin: warm and dry, no rash Neuro:  Strength and sensation are intact Psych: euthymic mood, full affect   EKG:  EKG is not ordered today. The ekg performed today by primary care demonstrates sinus rhythm, no acute ischemic changes, nonpathologic Q waves in leads 1 and aVL   Recent Labs: 09/01/2015: ALT 16; BUN 22; Creatinine, Ser 0.78; Hemoglobin 14.2; Platelets 242.0; Potassium 3.6; Sodium 140; TSH 7.96*     Lipid Panel    Component Value Date/Time   CHOL 197 09/01/2015 0818   TRIG 51.0 09/01/2015 0818   HDL 55.30 09/01/2015 0818   CHOLHDL 4 09/01/2015 0818   VLDL 10.2 09/01/2015 0818   LDLCALC 132* 09/01/2015 0818     Wt Readings from Last 3 Encounters:  10/14/15 152 lb 12.8 oz (69.31 kg)  10/14/15 152 lb 9.6 oz (69.219 kg)  09/30/15 152 lb 3.2 oz (69.037 kg)     Other studies Reviewed: Additional studies/ records that were reviewed today include: Office records and testing  ASSESSMENT AND PLAN:Dr. Clifton James was in to see the patient   1.  Chest pain: Her symptoms are atypical in that there has been almost no association with exertion, the patient is under higher stress level than usual and has been for several months. Additionally, the episodes have many atypical characteristics. However, she has multiple cardiac risk factors and thought she got mild chest pain today with exertion.   Therefore, ischemic evaluation is indicated. She feels she can walk a treadmill. Therefore, we will schedule a treadmill Myoview and an echocardiogram for further evaluation. If these are abnormal, she will be started on aspirin and a statin. Consideration can be given to a low-dose beta blocker but her blood pressure is well controlled.   Current medicines are reviewed at length with the patient today.  The patient does not have concerns regarding medicines.  The following changes have been made:  no change  Labs/ tests ordered today include:   Orders Placed This Encounter  Procedures  . Myocardial Perfusion Imaging  . Echocardiogram     Disposition:   FU with Dr. Clifton James   Signed, Theodore Demark, PA-C  10/14/2015 3:03 PM    [facility] Phone: [phone]; Fax: [phone]  This note was written with the assistance of speech recognition software. Please excuse any transcriptional errors.

## 2015-10-14 NOTE — Patient Instructions (Addendum)
Medication Instructions:  Your physician recommends that you continue on your current medications as directed. Please refer to the Current Medication list given to you today.   Labwork: None ordered  Testing/Procedures: Your physician has requested that you have an echocardiogram. Echocardiography is a painless test that uses sound waves to create images of your heart. It provides your doctor with information about the size and shape of your heart and how well your heart's chambers and valves are working. This procedure takes approximately one hour. There are no restrictions for this procedure.   Your physician has requested that you have en exercise stress myoview. For further information please visit [email or web address] Please follow instruction sheet, as given.   Follow-Up: Your physician recommends that you schedule a follow-up appointment in:  1 MONTH WITH DR. Clifton James    Any Other Special Instructions Will Be Listed Below (If Applicable).   If you need a refill on your cardiac medications before your next appointment, please call your pharmacy.

## 2015-10-14 NOTE — Progress Notes (Signed)
Subjective:    Patient ID: Susan Terrell, female    DOB: [birth year], 60 y.o.   MRN: [id number]  HPI  Pt in stating she has had some coming and going chest pressure over past 6 months.    Pt states she has some chest pressure over 6 months and would last 15-30 minutes. Pt can't say that acitivity makes it worse. Will happened randomly and at rest. Last 2 weeks has been little worse. Some time some pain in her back. Sometimes some rt arm pain. Sometimes feels flushed. But no nausea, no vomiting, no sweating or jaw pain. No sob.  Pt last time had discomfort was last night around 10 pm. Last night lasted 5-10 minutes. Pt has mild ldl elevation. Not diabetic. Pt mom has atrial fibrillation. Pt brother died of MI at 25 yo(he was a former smoker).    Pt has recent cough for 3 weeks ago. Pt went to urgent care. They put pt on augmentin. Pt felt nasal congestion. Pt coughing up some mucous. Her rt side sinus still feels pressure. Pt does have history of allergies in late spring,   Review of Systems  Respiratory: Positive for cough.        No chest pain now.  Cardiovascular: Positive for chest pain. Negative for palpitations.  Gastrointestinal: Negative for abdominal pain.  Musculoskeletal: Negative for myalgias and back pain.  Skin: Negative for pallor and rash.  Neurological: Negative for dizziness and numbness.  Hematological: Negative for adenopathy. Does not bruise/bleed easily.  Psychiatric/Behavioral: Negative for behavioral problems and confusion.       Stressed. Husband had MI in September. He is cardiac rehab. Mom is in hospice.    Past Medical History  Diagnosis Date  . IBS (irritable bowel syndrome) 2010  . Osteopenia 2013  . Anal fissure   . UTI (lower urinary tract infection)   . Pneumonia   . Chicken pox as a child  . Measles as a child  . Mumps as a child  . Asthma     childhood?  . Elevated BP 11/10/2013  . Vitamin D deficiency 02/20/2015  . Headache 03/01/2015  .  Preventative health care 09/01/2015  . Cervical cancer screening 09/01/2015  . Hypothyroidism 09/06/2015    Social History   Social History  . Marital Status: Married    Spouse Name: N/A  . Number of Children: N/A  . Years of Education: N/A   Occupational History  . Not on file.   Social History Main Topics  . Smoking status: Never Smoker   . Smokeless tobacco: Never Used  . Alcohol Use: No  . Drug Use: No  . Sexual Activity: Yes    Birth Control/ Protection: Surgical     Comment: lives with husband and daughter, no dietary restrictions.   Other Topics Concern  . Not on file   Social History Narrative    Past Surgical History  Procedure Laterality Date  . Abdominal hysterectomy    . Pilonidal cyst excision    . Incise and drain abcess      right groin  . Breast surgery      left needle biopsy    Family History  Problem Relation Age of Onset  . Cancer Father     pancreatic   . Heart attack Brother   . Heart disease Brother     MI  . Cancer Mother     colon  . Hyperlipidemia Mother   . Glaucoma Mother   .  Thyroid disease Mother   . Hypertension Mother   . Cataracts Sister   . Asthma Daughter   . Hypertension Daughter   . Cataracts Maternal Grandmother   . Alcohol abuse Maternal Grandfather   . Hyperlipidemia Sister   . Glaucoma Sister   . Arthritis Sister     rheumatoid  . Glaucoma Brother   . Hyperlipidemia Brother   . Hypertension Brother   . Other Brother     carotid artery disease  . Colitis Daughter   . Hyperlipidemia Daughter   . Asthma Daughter     Allergies  Allergen Reactions  . Bee Venom     Swelling localized  . Codeine   . Iodine   . Macrodantin     Current Outpatient Prescriptions on File Prior to Visit  Medication Sig Dispense Refill  . EPINEPHrine (EPIPEN 2-PAK) 0.3 mg/0.3 mL DEVI Inject 0.3 mLs (0.3 mg total) into the muscle once. 1 Device 3  . levothyroxine (SYNTHROID) 25 MCG tablet Take 1 tablet (25 mcg total) by mouth  daily before breakfast. 30 tablet 3  . TURMERIC PO Take by mouth daily. Reported on 09/30/2015    . Vitamin D, Ergocalciferol, (DRISDOL) 50000 units CAPS capsule Take 1 capsule (50,000 Units total) by mouth every 7 (seven) days. 4 capsule 2   No current facility-administered medications on file prior to visit.    BP 120/70 mmHg  Pulse 67  Temp(Src) 98.1 F (36.7 C) (Oral)  Ht  (1.575 m)  Wt 152 lb 9.6 oz (69.219 kg)  BMI 27.90 kg/m2  SpO2 98%       Objective:   Physical Exam  General Mental Status- Alert. General Appearance- Not in acute distress.    HEENT Head- Normal. Ear Auditory Canal - Left- Normal. Right - Normal.Tympanic Membrane- Left- Normal. Right- Normal. Eye Sclera/Conjunctiva- Left- Normal. Right- Normal. Nose & Sinuses Nasal Mucosa- Left-  Boggy and Congested. Right-  Boggy and  Congested.Bilateral rt side  maxillary and  rt side frontal sinus pressure. Mouth & Throat Lips: Upper Lip- Normal: no dryness, cracking, pallor, cyanosis, or vesicular eruption. Lower Lip-Normal: no dryness, cracking, pallor, cyanosis or vesicular eruption. Buccal Mucosa- Bilateral- No Aphthous ulcers. Oropharynx- No Discharge or Erythema. Tonsils: Characteristics- Bilateral- No Erythema or Congestion. Size/Enlargement- Bilateral- No enlargement. Discharge- bilateral-None.   Neck Carotid Arteries- Normal color. Moisture- Normal Moisture. No carotid bruits. No JVD.  Chest and Lung Exam Auscultation: Breath Sounds:-Normal.  Cardiovascular Auscultation:Rythm- Regular. Murmurs & Other Heart Sounds:Auscultation of the heart reveals- No Murmurs.  Abdomen Inspection:-Inspeection Normal. Palpation/Percussion:Note:No mass. Palpation and Percussion of the abdomen reveal- Non Tender, Non Distended + BS, no rebound or guarding. No abdominal bruit.  Neurologic Cranial Nerve exam:- CN III-XII intact(No nystagmus), symmetric smile. Strength:- 5/5 equal and symmetric strength both  upper and lower extremities.      Assessment & Plan:  ekg sinus rhythm. But negative precordial t wave in v1 and v2. (no prior to compare.) Getting stat cardiology referral. And stat troponin. If pt has any recurrent pain explained ED evalution immediate. Pt expressed understanding.  For your chest discomfort history(not present now). We did ekg. With your pain and some risk factors will refer you to cardiologist. Will also get troponin test today. If any recurrent pain as described before prior to cardiologist then emergency dept evaluation.  For you sinus pressure, nasal congestion and productive cough, I think you may have early allergies this year with possible sinus infection. Continue you flonase and will add  astelin nasal spray. Also rx of azithromycin antibiotic.  Follow up in 7 days or as needed.  Actually pt was set up to see cardiologist office at 1:30 today. She was given appointment info.

## 2015-10-27 ENCOUNTER — Telehealth (HOSPITAL_COMMUNITY): Payer: Self-pay | Admitting: *Deleted

## 2015-10-27 NOTE — Telephone Encounter (Signed)
Patient's husband given detailed instructions per Myocardial Perfusion Study Information Sheet for the test on 10/29/15 at 0715. Patient's husband notified for patient to arrive 15 minutes early and that it is imperative to arrive on time for appointment to keep from having the test rescheduled.  If you need to cancel or reschedule your appointment, please call the office within 24 hours of your appointment. Failure to do so may result in a cancellation of your appointment, and a $50 no show fee. Patient's husband verbalized understanding.Ikechukwu Cerny, Adelene IdlerCynthia W

## 2015-10-29 ENCOUNTER — Other Ambulatory Visit (HOSPITAL_COMMUNITY): Payer: 59

## 2015-10-29 ENCOUNTER — Encounter (HOSPITAL_COMMUNITY): Payer: 59

## 2015-11-16 ENCOUNTER — Telehealth (HOSPITAL_COMMUNITY): Payer: Self-pay | Admitting: *Deleted

## 2015-11-16 NOTE — Telephone Encounter (Signed)
Patient given detailed instructions per Myocardial Perfusion Study Information Sheet for the test on 11/18/15 at 0745. Patient notified to arrive 15 minutes early and that it is imperative to arrive on time for appointment to keep from having the test rescheduled.  If you need to cancel or reschedule your appointment, please call the office within 24 hours of your appointment. Failure to do so may result in a cancellation of your appointment, and a $50 no show fee. Patient verbalized understanding.Damareon Lanni, Adelene IdlerCynthia W

## 2015-11-18 ENCOUNTER — Ambulatory Visit (HOSPITAL_COMMUNITY): Payer: 59 | Attending: Physician Assistant

## 2015-11-18 ENCOUNTER — Ambulatory Visit (HOSPITAL_BASED_OUTPATIENT_CLINIC_OR_DEPARTMENT_OTHER): Payer: 59

## 2015-11-18 ENCOUNTER — Other Ambulatory Visit: Payer: Self-pay

## 2015-11-18 DIAGNOSIS — I071 Rheumatic tricuspid insufficiency: Secondary | ICD-10-CM | POA: Insufficient documentation

## 2015-11-18 DIAGNOSIS — R9439 Abnormal result of other cardiovascular function study: Secondary | ICD-10-CM | POA: Diagnosis not present

## 2015-11-18 DIAGNOSIS — I341 Nonrheumatic mitral (valve) prolapse: Secondary | ICD-10-CM | POA: Insufficient documentation

## 2015-11-18 DIAGNOSIS — I10 Essential (primary) hypertension: Secondary | ICD-10-CM | POA: Insufficient documentation

## 2015-11-18 DIAGNOSIS — R079 Chest pain, unspecified: Secondary | ICD-10-CM

## 2015-11-18 DIAGNOSIS — Z8249 Family history of ischemic heart disease and other diseases of the circulatory system: Secondary | ICD-10-CM | POA: Insufficient documentation

## 2015-11-18 DIAGNOSIS — E785 Hyperlipidemia, unspecified: Secondary | ICD-10-CM | POA: Diagnosis not present

## 2015-11-18 DIAGNOSIS — I340 Nonrheumatic mitral (valve) insufficiency: Secondary | ICD-10-CM | POA: Diagnosis not present

## 2015-11-18 LAB — MYOCARDIAL PERFUSION IMAGING
CHL CUP NUCLEAR SRS: 8
CHL CUP NUCLEAR SSS: 15
CHL CUP RESTING HR STRESS: 52 {beats}/min
CSEPED: 7 min
CSEPEDS: 30 s
Estimated workload: 9.3 METS
LV sys vol: 22 mL
LVDIAVOL: 71 mL (ref 46–106)
MPHR: 159 {beats}/min
NUC STRESS TID: 1.03
Peak HR: 157 {beats}/min
Percent HR: 98 %
RATE: 0.35
SDS: 7

## 2015-11-18 MED ORDER — TECHNETIUM TC 99M SESTAMIBI GENERIC - CARDIOLITE
30.6000 | Freq: Once | INTRAVENOUS | Status: AC | PRN
  Administered 2015-11-18: 31 via INTRAVENOUS

## 2015-11-18 MED ORDER — TECHNETIUM TC 99M SESTAMIBI GENERIC - CARDIOLITE
10.3000 | Freq: Once | INTRAVENOUS | Status: AC | PRN
  Administered 2015-11-18: 10 via INTRAVENOUS

## 2015-11-19 ENCOUNTER — Ambulatory Visit: Payer: 59 | Admitting: Cardiovascular Disease

## 2015-11-26 ENCOUNTER — Ambulatory Visit (INDEPENDENT_AMBULATORY_CARE_PROVIDER_SITE_OTHER): Payer: 59 | Admitting: Physician Assistant

## 2015-11-26 ENCOUNTER — Encounter: Payer: Self-pay | Admitting: Physician Assistant

## 2015-11-26 VITALS — BP 120/70 | HR 51 | Ht 62.0 in | Wt 152.8 lb

## 2015-11-26 DIAGNOSIS — R0789 Other chest pain: Secondary | ICD-10-CM

## 2015-11-26 MED ORDER — ASPIRIN EC 81 MG PO TBEC: 81.0000 mg | DELAYED_RELEASE_TABLET | Freq: Every day | ORAL | Status: DC

## 2015-11-26 NOTE — Patient Instructions (Signed)
Medication Instructions:  Your physician has recommended you make the following change in your medication:  1. Start Asprin ( 81 mg ) daily   Labwork: -None  Testing/Procedures: -None  Follow-Up: Your physician wants you to follow-up in: 6-9 months with Dr. Clifton JamesMcAlhany.  You will receive a reminder letter in the mail two months in advance. If you don't receive a letter, please call our office to schedule the follow-up appointment.   Any Other Special Instructions Will Be Listed Below (If Applicable).     If you need a refill on your cardiac medications before your next appointment, please call your pharmacy.

## 2015-11-26 NOTE — Addendum Note (Signed)
Addended by: Reesa ChewJONES, Billiejean Schimek G on: 11/26/2015 09:33 AM   Modules accepted: Orders, Medications

## 2015-11-26 NOTE — Progress Notes (Signed)
Cardiology Office Note   Date:  11/26/2015   ID:  Susan Terrell, DOB 1955-10-28, MRN [id number]  PCP:  Danise Edge, MD  Cardiologist:  Dr. Clifton James   Chief Complaint  Patient presents with  . Follow-up    seen for Dr. Clifton James      History of Present Illness: Susan Terrell is a 60 y.o. female who presents for cardiology follow-up. She was seen in the clinic on 10/14/2015 for evaluation of chest pain. Her symptom was felt to be likely related to anxiety. She has past medical history of IBS, hypertension, hypothyroidism, and mixed hyperlipidemia. 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 Her mother has also been chronically ill. She and her sister take turns staying with her at night on the weekends. Her husband also suffered a heart attack within the past year. She sleeps very poorly and does not feel rested when she wakes up. Even though her symptom is somewhat atypical, given her risk factors, it was recommended for her to undergo stress test and obtain echocardiogram.  Echocardiogram obtained on 11/18/2015 showed EF 55-60%, grade 1 diastolic dysfunction, mild MR, PA peak pressure 21 mmHg. Myocardial perfusion test on 11/18/2015 showed EF 69%, small sized mild fixed defect in the basal inferolateral wall, medial defect of mild severity present in the mid anteroseptal, apical anterior, apical septal location consistent with breast attenuation, the defect is not reversible, overall it is a low risk study.   Patient presents today for cardiology follow-up. Overall she has been feeling very well. Despite a mentally strenuous job, she continued to the active and exercise by walking 10,000 steps per day which is being tracked by her fitbit. She says her chest pain is not related to activity and tend to occur at any time. She also has a right sided numbness from head to toe that occurs 30 minutes at a time intermittently, this does not appears to be associated with chest pain  and occurs at different time as well. She had elevated TSH in January, lipid panel back in January showed good cholesterol, triglycerides and HDL, she does have elevated LDL of 130. She is not take any statin at this time, she is planning to try to manage it with diet and exercise and recheck later. Her primary care physician is monitoring her thyroid and cholesterol level. I do not think she needs any further cardiology workup as this time. She does not need any blood work either. Her chest pain is very atypical, recent stress test and echocardiogram very reassuring. She can follow-up with Dr. Clifton James in 6-9 months and to contact cardiology on an as-needed basis during meantime.    Past Medical History  Diagnosis Date  . IBS (irritable bowel syndrome) 2010  . Osteopenia 2013  . Anal fissure   . UTI (lower urinary tract infection)   . Pneumonia   . Chicken pox as a child  . Measles as a child  . Mumps as a child  . Asthma     childhood?  . Elevated BP 11/10/2013  . Vitamin D deficiency 02/20/2015  . Headache 03/01/2015  . Preventative health care 09/01/2015  . Cervical cancer screening 09/01/2015  . Hypothyroidism 09/06/2015    Past Surgical History  Procedure Laterality Date  . Abdominal hysterectomy    . Pilonidal cyst excision    . Incise and drain abcess      right groin  . Breast surgery  left needle biopsy     Current Outpatient Prescriptions  Medication Sig Dispense Refill  . azelastine (ASTELIN) 0.1 % nasal spray Place 2 sprays into both nostrils 2 (two) times daily. Use in each nostril as directed 30 mL 2  . azithromycin (ZITHROMAX) 250 MG tablet Take 2 tablets by mouth on day 1, followed by 1 tablet by mouth daily for 4 days. 6 tablet 0  . EPINEPHrine (EPIPEN 2-PAK) 0.3 mg/0.3 mL DEVI Inject 0.3 mLs (0.3 mg total) into the muscle once. 1 Device 3  . levothyroxine (SYNTHROID) 25 MCG tablet Take 1 tablet (25 mcg total) by mouth daily before breakfast. 30 tablet 3  .  TURMERIC PO Take 1 tablet by mouth daily. Reported on 09/30/2015    . Vitamin D, Ergocalciferol, (DRISDOL) 50000 units CAPS capsule Take 1 capsule (50,000 Units total) by mouth every 7 (seven) days. 4 capsule 2   No current facility-administered medications for this visit.    Allergies:   Bee venom; Codeine; Iodine; and Macrodantin    Social History:  The patient  reports that she has never smoked. She has never used smokeless tobacco. She reports that she does not drink alcohol or use illicit drugs.   Family History:  The patient's family history includes Alcohol abuse in her maternal grandfather; Arthritis in her sister; Asthma in her daughter and daughter; Cancer in her father and mother; Cataracts in her maternal grandmother and sister; Colitis in her daughter; Glaucoma in her brother, mother, and sister; Heart attack in her brother; Hyperlipidemia in her brother, daughter, mother, and sister; Hypertension in her brother, daughter, and mother; Other in her brother; Thyroid disease in her mother.    ROS:  Please see the history of present illness.   Otherwise, review of systems are positive for Atypical chest pain, intermittent right-sided numbness.   All other systems are reviewed and negative.    PHYSICAL EXAM: VS:  There were no vitals taken for this visit. , BMI There is no weight on file to calculate BMI. GEN: Well nourished, well developed, in no acute distress HEENT: normal Neck: no JVD, carotid bruits, or masses Cardiac: RRR; no murmurs, rubs, or gallops,no edema  Respiratory:  clear to auscultation bilaterally, normal work of breathing GI: soft, nontender, nondistended, + BS MS: no deformity or atrophy Skin: warm and dry, no rash Neuro:  Strength and sensation are intact Psych: euthymic mood, full affect   EKG:  EKG is not ordered today.   Recent Labs: 09/01/2015: ALT 16; BUN 22; Creatinine, Ser 0.78; Hemoglobin 14.2; Platelets 242.0; Potassium 3.6; Sodium 140; TSH 7.96*      Lipid Panel    Component Value Date/Time   CHOL 197 09/01/2015 0818   TRIG 51.0 09/01/2015 0818   HDL 55.30 09/01/2015 0818   CHOLHDL 4 09/01/2015 0818   VLDL 10.2 09/01/2015 0818   LDLCALC 132* 09/01/2015 0818      Wt Readings from Last 3 Encounters:  10/14/15 152 lb 12.8 oz (69.31 kg)  10/14/15 152 lb 9.6 oz (69.219 kg)  09/30/15 152 lb 3.2 oz (69.037 kg)      Other studies Reviewed: Additional studies/ records that were reviewed today include:   Echo 11/18/2015 LV EF: 55% - 60%  ------------------------------------------------------------------- Indications: Chest Pain (R07.9).  ------------------------------------------------------------------- History: PMH: Asthma. Risk factors: Family history of coronary artery disease. Hypertension. Dyslipidemia.  ------------------------------------------------------------------- Study Conclusions  - Left ventricle: The cavity size was normal. Systolic function was  normal. The estimated ejection fraction was in  the range of 55%  to 60%. Wall motion was normal; there were no regional wall  motion abnormalities. Doppler parameters are consistent with  abnormal left ventricular relaxation (grade 1 diastolic  dysfunction). - Aortic valve: Transvalvular velocity was within the normal range.  There was no stenosis. There was no regurgitation. - Mitral valve: Mildly calcified annulus. Mild prolapse, involving  the posterior leaflet. There was mild regurgitation directed  posteriorly. - Right ventricle: The cavity size was normal. Wall thickness was  normal. Systolic function was normal. - Atrial septum: No defect or patent foramen ovale was identified. - Tricuspid valve: There was trivial regurgitation. - Pulmonary arteries: Systolic pressure was within the normal  range. PA peak pressure: 21 mm Hg (S).   Myoview 11/18/2015 Study Highlights     Nuclear stress EF: 69%.  Blood pressure  demonstrated a hypertensive response to exercise.  There was no ST segment deviation noted during stress.  There is a small sized, mild fixed defect in the basal inferolateral wall. There is a medium defect of mild severity present in the mid anteroseptal, apical anterior and apical septal location. The defect is non-reversible and consistent with breast attenuation artifact.  This is a low risk study.  The left ventricular ejection fraction is hyperdynamic (>65%).      Review of the above records demonstrates:   Recently seen for intermittent chest pain, outpatient echocardiogram and Myoview shows normal EF and no reversible ischemia.   ASSESSMENT AND PLAN:  1.  Atypical chest pain: Normal EF on echocardiogram and negative Myoview very reassuring. Her chest pain is not related to exertion and she continued to be very active. No further workup planned at this time. Given her age she will benefit from 81 mg aspirin.  2. Hyperlipidemia: Last lipid profile obtained by PCP on 09/01/2015 shows cholesterol 197, triglyceride 51, HDL 55, LDL 132. She is trying diet and exercise and does not want to start statin and this time.  3. Hypothyroidism: On Synthroid, managed by PCP. Noted last TSH in January was high.    Current medicines are reviewed at length with the patient today.  The patient does not have concerns regarding medicines.  The following changes have been made:  no change  Labs/ tests ordered today include:  No orders of the defined types were placed in this encounter.     Disposition:   FU with Dr. Clifton James in 6 months  Signed, Azalee Course, [place]  11/26/2015 5:42 AM    [facility] [address] [place], [place], [place]  [address] Phone: [phone]; Fax: [phone]

## 2015-12-01 ENCOUNTER — Other Ambulatory Visit: Payer: 59

## 2015-12-09 ENCOUNTER — Ambulatory Visit (INDEPENDENT_AMBULATORY_CARE_PROVIDER_SITE_OTHER): Payer: 59 | Admitting: Physician Assistant

## 2015-12-09 VITALS — BP 122/72 | HR 81 | Temp 98.0°F | Resp 17 | Ht 62.5 in | Wt 153.0 lb

## 2015-12-09 DIAGNOSIS — W57XXXA Bitten or stung by nonvenomous insect and other nonvenomous arthropods, initial encounter: Secondary | ICD-10-CM

## 2015-12-09 DIAGNOSIS — S1086XA Insect bite of other specified part of neck, initial encounter: Secondary | ICD-10-CM | POA: Diagnosis not present

## 2015-12-09 MED ORDER — NEOMYCIN-POLYMYXIN-HC 3.5-10000-0.5 EX CREA: TOPICAL_CREAM | Freq: Two times a day (BID) | CUTANEOUS | Status: DC

## 2015-12-09 NOTE — Patient Instructions (Addendum)
     IF you received an x-ray today, you will receive an invoice from [facility]. Please contact [facility] at [phone] with questions or concerns regarding your invoice.   IF you received labwork today, you will receive an invoice from [facility]. Please contact [facility] at [phone] with questions or concerns regarding your invoice.   Our billing staff will not be able to assist you with questions regarding bills from these companies.  You will be contacted with the lab results as soon as they are available. The fastest way to get your results is to activate your My Chart account. Instructions are located on the last page of this paperwork. If you have not heard from us regarding the results in 2 weeks, please contact this office.    You can also use a benadryl itch cream in this area.   Please let me know if this area widens at all.  We need to keep a close watch.  It itches at this time which is consistent with a bug bite.   Cellulitis Cellulitis is an infection of the skin and the tissue beneath it. The infected area is usually red and tender. Cellulitis occurs most often in the arms and lower legs.  CAUSES  Cellulitis is caused by bacteria that enter the skin through cracks or cuts in the skin. The most common types of bacteria that cause cellulitis are staphylococci and streptococci. SIGNS AND SYMPTOMS   Redness and warmth.  Swelling.  Tenderness or pain.  Fever. DIAGNOSIS  Your health care provider can usually determine what is wrong based on a physical exam. Blood tests may also be done. TREATMENT  Treatment usually involves taking an antibiotic medicine. HOME CARE INSTRUCTIONS   Take your antibiotic medicine as directed by your health care provider. Finish the antibiotic even if you start to feel better.  Keep the infected arm or leg elevated to reduce swelling.  Apply a warm cloth to the affected area up to 4 times  per day to relieve pain.  Take medicines only as directed by your health care provider.  Keep all follow-up visits as directed by your health care provider. SEEK MEDICAL CARE IF:   You notice red streaks coming from the infected area.  Your red area gets larger or turns dark in color.  Your bone or joint underneath the infected area becomes painful after the skin has healed.  Your infection returns in the same area or another area.  You notice a swollen bump in the infected area.  You develop new symptoms.  You have a fever. SEEK IMMEDIATE MEDICAL CARE IF:   You feel very sleepy.  You develop vomiting or diarrhea.  You have a general ill feeling (malaise) with muscle aches and pains.   This information is not intended to replace advice given to you by your health care provider. Make sure you discuss any questions you have with your health care provider.   Document Released: 05/11/2005 Document Revised: 04/22/2015 Document Reviewed: 10/17/2011 Elsevier Interactive Patient Education [employer]2016 [employer]

## 2015-12-10 NOTE — Progress Notes (Signed)
[facility] [address] [place] [address] [phone]  Date:  12/09/2015   Name:  Susan Terrell   DOB:  05-Dec-1955   MRN:  [id number]  PCP:  Danise Edge, MD   Chief Complaint  Patient presents with  . boil on neck     History of Present Illness:  Susan Terrell is a 60 y.o. female patient who presents to [facility] for bump on neck Patient has noticed for about 1 week with a bump on the side of neck.  It is pruritic without any pain.  It started as a small red mark.  She does not report a target like lesion.  There has been no drainage.  She denies any fever or nausea.  She has used cortisone and alcohol pads in the area.  She does not recall being bit.  No hx of tic bite.  She has not had much outdoor activity.   Patient Active Problem List   Diagnosis Date Noted  . Hypothyroidism 09/06/2015  . Preventative health care 09/01/2015  . Cervical cancer screening 09/01/2015  . Headache 03/01/2015  . Vitamin D deficiency 02/20/2015  . 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 UTI (urinary tract infection) 11/10/2013  . Elevated BP 11/10/2013  . Asthma   . Pure hypercholesterolemia 10/21/2011  . Other abnormal glucose 10/21/2011    Past Medical History  Diagnosis Date  . IBS (irritable bowel syndrome) 2010  . Osteopenia 2013  . Anal fissure   . UTI (lower urinary tract infection)   . Pneumonia   . Chicken pox as a child  . Measles as a child  . Mumps as a child  . Asthma     childhood?  . Elevated BP 11/10/2013  . Vitamin D deficiency 02/20/2015  . Headache 03/01/2015  . Preventative health care 09/01/2015  . Cervical cancer screening 09/01/2015  . Hypothyroidism 09/06/2015    Past Surgical History  Procedure Laterality Date  . Abdominal hysterectomy    . Pilonidal cyst excision    . Incise and drain abcess      right groin  . Breast surgery      left needle biopsy    Social History  Substance Use Topics  . Smoking status: Never Smoker   .  Smokeless tobacco: Never Used  . Alcohol Use: No    Family History  Problem Relation Age of Onset  . Cancer Father     pancreatic   . Heart attack Brother   . Cancer Mother     colon  . Hyperlipidemia Mother   . Glaucoma Mother   . Thyroid disease Mother   . Hypertension Mother   . Cataracts Sister   . Asthma Daughter   . Hypertension Daughter   . Cataracts Maternal Grandmother   . Alcohol abuse Maternal Grandfather   . Hyperlipidemia Sister   . Glaucoma Sister   . Arthritis Sister     rheumatoid  . Glaucoma Brother   . Hyperlipidemia Brother   . Hypertension Brother   . Other Brother     carotid artery disease  . Colitis Daughter   . Hyperlipidemia Daughter   . Asthma Daughter     Allergies  Allergen Reactions  . Bee Venom     Swelling localized  . Codeine   . Iodine   . Macrodantin     Medication list has been reviewed and updated.  Current Outpatient Prescriptions on File Prior to Visit  Medication Sig Dispense Refill  . aspirin EC 81 MG tablet Take 1 tablet (81 mg total) by mouth daily. 30 tablet 0  . levothyroxine (SYNTHROID) 25 MCG tablet Take 1 tablet (25 mcg total) by mouth daily before breakfast. 30 tablet 3  . Vitamin D, Ergocalciferol, (DRISDOL) 50000 units CAPS capsule Take 1 capsule (50,000 Units total) by mouth every 7 (seven) days. 4 capsule 2  . azelastine (ASTELIN) 0.1 % nasal spray Place 2 sprays into both nostrils 2 (two) times daily. Reported on 12/09/2015    . EPINEPHrine (EPIPEN 2-PAK) 0.3 mg/0.3 mL DEVI Inject 0.3 mLs (0.3 mg total) into the muscle once. (Patient not taking: Reported on 12/09/2015) 1 Device 3   No current facility-administered medications on file prior to visit.    ROS ROS otherwise unremarkable unless listed above.   Physical Examination: BP 122/72 mmHg  Pulse 81  Temp(Src) 98 F (36.7 C) (Oral)  Resp 17  Ht 5' 2.5" (1.588 m)  Wt 153 lb (69.4 kg)  BMI 27.52 kg/m2  SpO2 98% Ideal Body Weight: Weight in (lb) to  have BMI = 25: 138.6  Physical Exam  Constitutional: She is oriented to person, place, and time. She appears well-developed and well-nourished. No distress.  HENT:  Head: Normocephalic and atraumatic.  Right Ear: External ear normal.  Left Ear: External ear normal.  Eyes: Conjunctivae and EOM are normal. Pupils are equal, round, and reactive to light.  Cardiovascular: Normal rate.   Pulmonary/Chest: Effort normal. No respiratory distress.  Lymphadenopathy:    She has no cervical adenopathy.  Neurological: She is alert and oriented to person, place, and time.  Skin: Skin is warm and dry. She is not diaphoretic.  Small erythematous <1cm non-tender nodule with a mildly hyperpigmented scabbing.  With de-roofing, there is only sanguinous material.  She has surrounding mild erythema.  No tenderness in this area as well.   Psychiatric: She has a normal mood and affect. Her behavior is normal.     Assessment and Plan: Ivery QualeDebra T Beeck is a 60 y.o. female who is here today for bump at the right side of her neck. This very likely is an insect bite.  We use a steroid/antibiotic combo at this time.  i have advised her to be watchful of this lesion.  It is very pruritic without tenderness, but we can not rule out cellulitis.     1. Insect bite - neomycin-polymyxin-hydrocortisone (CORTISPORIN) 3.5-10000-0.5 cream; Apply topically 2 (two) times daily.  Dispense: 7.5 g; Refill: 0   Trena PlattStephanie Kerri Kovacik, PA-C [facility] 12/10/2015 7:03 AM

## 2015-12-17 ENCOUNTER — Other Ambulatory Visit: Payer: Self-pay | Admitting: Family Medicine

## 2015-12-30 ENCOUNTER — Telehealth: Payer: Self-pay | Admitting: Family Medicine

## 2015-12-30 DIAGNOSIS — Z780 Asymptomatic menopausal state: Secondary | ICD-10-CM

## 2015-12-30 NOTE — Telephone Encounter (Signed)
Orders placed for Bone Density w/ [facility].

## 2015-12-30 NOTE — Telephone Encounter (Signed)
Caller name: Self  Can be reached: [phone]   Reason for call: Need referral to [facility]/Dr Karsten FellsBretrand for a Bone Density Test scheduled for tomorrow.

## 2016-01-04 LAB — HM DEXA SCAN

## 2016-01-04 LAB — HM MAMMOGRAPHY: HM MAMMO: ABNORMAL — AB (ref 0–4)

## 2016-01-07 ENCOUNTER — Encounter: Payer: Self-pay | Admitting: Family Medicine

## 2016-01-14 ENCOUNTER — Telehealth: Payer: Self-pay | Admitting: Family Medicine

## 2016-01-14 ENCOUNTER — Encounter: Payer: Self-pay | Admitting: Family Medicine

## 2016-01-14 NOTE — Telephone Encounter (Signed)
Called the patients cell/home number left message to call back. PCP received her Bone Density Results. Still Osteopenia, calcium 1500 mg qd and Vitamin D 2000 IU daily, increase exercise.

## 2016-01-14 NOTE — Telephone Encounter (Signed)
Patient returned call and request a call back tomorrow with Bone Density results

## 2016-01-15 NOTE — Telephone Encounter (Signed)
Patient returning your call best # mobile  [phone]

## 2016-01-15 NOTE — Telephone Encounter (Signed)
Patient informed of results/instructions. 

## 2016-01-15 NOTE — Telephone Encounter (Signed)
Called cell/home number left message to call back. 

## 2016-01-15 NOTE — Telephone Encounter (Signed)
Returned her call again left msg. To call back.

## 2016-02-29 ENCOUNTER — Ambulatory Visit: Payer: 59 | Admitting: Family Medicine

## 2016-04-13 ENCOUNTER — Other Ambulatory Visit: Payer: Self-pay | Admitting: Family Medicine

## 2016-06-07 ENCOUNTER — Ambulatory Visit (INDEPENDENT_AMBULATORY_CARE_PROVIDER_SITE_OTHER): Payer: 59 | Admitting: Family Medicine

## 2016-06-07 ENCOUNTER — Ambulatory Visit (INDEPENDENT_AMBULATORY_CARE_PROVIDER_SITE_OTHER): Payer: 59

## 2016-06-07 VITALS — BP 130/90 | HR 80 | Temp 98.1°F | Resp 17 | Ht 62.5 in | Wt 153.0 lb

## 2016-06-07 DIAGNOSIS — R059 Cough, unspecified: Secondary | ICD-10-CM

## 2016-06-07 DIAGNOSIS — R05 Cough: Secondary | ICD-10-CM

## 2016-06-07 DIAGNOSIS — J22 Unspecified acute lower respiratory infection: Secondary | ICD-10-CM

## 2016-06-07 MED ORDER — AZITHROMYCIN 250 MG PO TABS: ORAL_TABLET | ORAL | 0 refills | Status: DC

## 2016-06-07 NOTE — Progress Notes (Addendum)
By signing my name below, I, Mesha Guinyard, attest that this documentation has been prepared under the direction and in the presence of Meredith Staggers, MD.  Electronically Signed: Arvilla Market, Medical Scribe. 06/07/16. 9:51 AM.  Subjective:    Patient ID: Susan Terrell, female    DOB: [birth year], 60 y.o.   MRN: [id number]  HPI Chief Complaint  Patient presents with  . Cough    Productive. Started sunday  . Nasal Congestion    HPI Comments: Susan Terrell is a 60 y.o. female who [facility] complaining of productive cough and chest congestion onset 3 days ago, Saturday night. Pt states her initial symptoms were similar to allergies when she felt it Saturday night (3 days ago). Pt reports associated symptoms of sore throat, brief episode of chills last night, and ear pain. Pt has childhood asthma, and hasn't had to use inhaler in 10-12 years. Pt's husband has been sick with similar symptoms for 4 days and she hasn't received her flu shot yet. 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 Pt denies fever, and SOB.  Patient Active Problem List   Diagnosis Date Noted  . Hypothyroidism 09/06/2015  . Preventative health care 09/01/2015  . Cervical cancer screening 09/01/2015  . Headache 03/01/2015  . Vitamin D deficiency 02/20/2015  . 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 UTI (urinary tract infection) 11/10/2013  . Elevated BP 11/10/2013  . Asthma   . Pure hypercholesterolemia 10/21/2011  . Other abnormal glucose 10/21/2011   Past Medical History:  Diagnosis Date  . Anal fissure   . Asthma    childhood?  . Cervical cancer screening 09/01/2015  . Chicken pox as a child  . Elevated BP 11/10/2013  . Headache 03/01/2015  . Hypothyroidism 09/06/2015  . IBS (irritable bowel syndrome) 2010  . Measles as a child  . Mumps as a child  . Osteopenia 2013  . Pneumonia   . Preventative health care 09/01/2015  . UTI  (lower urinary tract infection)   . Vitamin D deficiency 02/20/2015   Past Surgical History:  Procedure Laterality Date  . ABDOMINAL HYSTERECTOMY    . BREAST SURGERY     left needle biopsy  . INCISE AND DRAIN ABCESS     right groin  . PILONIDAL CYST EXCISION     Allergies  Allergen Reactions  . Bee Venom     Swelling localized  . Codeine   . Iodine   . Macrodantin    Prior to Admission medications   Medication Sig Start Date End Date Taking? Authorizing Provider  SYNTHROID 25 MCG tablet TAKE 1 TABLET(25 MCG) BY MOUTH DAILY BEFORE BREAKFAST 04/13/16  Yes Bradd Canary, MD  aspirin EC 81 MG tablet Take 1 tablet (81 mg total) by mouth daily. Patient not taking: Reported on 06/07/2016 11/26/15   Azalee Course, PA  azelastine (ASTELIN) 0.1 % nasal spray Place 2 sprays into both nostrils 2 (two) times daily. Reported on 12/09/2015    Historical Provider, MD  EPINEPHrine (EPIPEN 2-PAK) 0.3 mg/0.3 mL DEVI Inject 0.3 mLs (0.3 mg total) into the muscle once. Patient not taking: Reported on 06/07/2016 10/21/11   Etta Grandchild, MD  neomycin-polymyxin-hydrocortisone (CORTISPORIN) 3.5-10000-0.5 cream Apply topically 2 (two) times daily. Patient not taking: Reported on 06/07/2016 12/09/15   Collie Siad English, PA  Vitamin D, Ergocalciferol, (DRISDOL) 50000 units CAPS capsule Take 1 capsule (50,000 Units  total) by mouth every 7 (seven) days. Patient not taking: Reported on 06/07/2016 09/02/15   Bradd Canary, MD   Social History   Social History  . Marital status: Married    Spouse name: N/A  . Number of children: N/A  . Years of education: N/A   Occupational History  . Not on file.   Social History Main Topics  . Smoking status: Never Smoker  . Smokeless tobacco: Never Used  . Alcohol use No  . Drug use: No  . Sexual activity: Yes    Birth control/ protection: Surgical     Comment: lives with husband and daughter, no dietary restrictions.   Other Topics Concern  . Not on file   Social  History Narrative  . No narrative on file   Review of Systems  Constitutional: Positive for chills. Negative for fever.  HENT: Positive for congestion, ear pain (little) and sore throat.   Respiratory: Positive for cough (productive). Negative for shortness of breath and wheezing.    Objective:  Physical Exam  Constitutional: She appears well-developed and well-nourished. No distress.  HENT:  Head: Normocephalic and atraumatic.  Nose: Right sinus exhibits no maxillary sinus tenderness and no frontal sinus tenderness. Left sinus exhibits no maxillary sinus tenderness and no frontal sinus tenderness.  Mouth/Throat: Oropharynx is clear and moist.  Eyes: Conjunctivae are normal.  Neck: Neck supple.  Cardiovascular: Normal rate, regular rhythm and normal heart sounds.  Exam reveals no gallop and no friction rub.   No murmur heard. Pulmonary/Chest: Effort normal. No respiratory distress. She has no wheezes. She has rhonchi.  Few scattered rhonchi left and right lobe, otherwise nl  Lymphadenopathy:    She has no cervical adenopathy.  Neurological: She is alert.  Skin: Skin is warm and dry.  Psychiatric: She has a normal mood and affect. Her behavior is normal.  Nursing note and vitals reviewed.  BP 130/90 (BP Location: Right Arm, Patient Position: Sitting, Cuff Size: Normal)   Pulse 80   Temp 98.1 F (36.7 C) (Oral)   Resp 17   Ht 5' 2.5" (1.588 m)   Wt 153 lb (69.4 kg)   SpO2 96%   BMI 27.54 kg/m    Dg Chest 2 View  Result Date: 06/07/2016 CLINICAL DATA:  Cough.  Lower lobe rhonchi on exam. EXAM: CHEST  2 VIEW COMPARISON:  09/12/2011 FINDINGS: The heart size and mediastinal contours are within normal limits. Mild airspace opacity is seen in the right middle lobe, suspicious for pneumonia. Left lung is clear. No evidence of pneumothorax or pleural effusion. IMPRESSION: Mild right middle lobe airspace disease, suspicious for pneumonia. Post treatment chest radiograph follow-up is  recommended in several weeks to confirm resolution. Electronically Signed   By: Myles Rosenthal M.D.   On: 06/07/2016 11:07     Assessment & Plan:   Susan Terrell is a 60 y.o. female Cough - Plan: DG Chest 2 View  LRTI (lower respiratory tract infection) - Plan: DG Chest 2 View  Initially appeared to be possible viral illness, but does have some coarse breath sounds/rhonchi lower lobes with increased cough overnight. Will prescribe azithromycin/Z-Pak, chest x-ray was obtained, results are pending.  - As long as there is no pneumonia on chest x-ray, she can wait to see if her symptoms start to improve in the next 24-48 hours. Mucinex samples given. If not improving, start azithromycin, or if there are signs of infiltrate on chest x-ray, start azithromycin. RTC precautions given.  3:42 PM X-ray report reviewed, right middle lobe pneumonia likely based on chest x-ray. Start azithromycin, recheck in 48-72 hours if not significantly improved, sooner if worse. Plan on repeat chest x-ray in 4 weeks to document resolution. Plan discussed with patient on phone  No orders of the defined types were placed in this encounter.  Patient Instructions       IF you received an x-ray today, you will receive an invoice from [facility]. Please contact [facility] at [phone] with questions or concerns regarding your invoice.   IF you received labwork today, you will receive an invoice from [facility]. Please contact [facility] at [phone] with questions or concerns regarding your invoice.   Our billing staff will not be able to assist you with questions regarding bills from these companies.  You will be contacted with the lab results as soon as they are available. The fastest way to get your results is to activate your My Chart account. Instructions are located on the last page of this paperwork. If you have not heard from us regarding the results in 2 weeks,  please contact this office.        I personally performed the services described in this documentation, which was scribed in my presence. The recorded information has been reviewed and considered, and addended by me as needed.   Signed,   Meredith StaggersJeffrey Redith Drach, MD [facility].  06/07/16 10:40 AM

## 2016-06-07 NOTE — Patient Instructions (Addendum)
Your symptoms were likely due to a virus this weekend, and chest congestion still may be due to a virus. If the radiologist sees a pneumonia, we will call you and let you know to start the antibiotic. Otherwise you can hold onto that for the next day or 2 to see if your symptoms will improve without antibiotics. You can use Mucinex, salt water nasal spray for nasal congestion, sore throat lozenges such as Cepacol or other cough drops. drink plenty of fluids and rest as needed. If not improving the next day or two, you can fill the antibiotic as we discussed.  Return to the clinic or go to the nearest emergency room if any of your symptoms worsen or new symptoms occur.   Upper Respiratory Infection, Adult Most upper respiratory infections (URIs) are a viral infection of the air passages leading to the lungs. A URI affects the nose, throat, and upper air passages. The most common type of URI is nasopharyngitis and is typically referred to as "the common cold." URIs run their course and usually go away on their own. Most of the time, a URI does not require medical attention, but sometimes a bacterial infection in the upper airways can follow a viral infection. This is called a secondary infection. Sinus and middle ear infections are common types of secondary upper respiratory infections. Bacterial pneumonia can also complicate a URI. A URI can worsen asthma and chronic obstructive pulmonary disease (COPD). Sometimes, these complications can require emergency medical care and may be life threatening.  CAUSES Almost all URIs are caused by viruses. A virus is a type of germ and can spread from one person to another.  RISKS FACTORS You may be at risk for a URI if:   You smoke.   You have chronic heart or lung disease.  You have a weakened defense (immune) system.   You are very young or very old.   You have nasal allergies or asthma.  You work in crowded or poorly ventilated areas.  You work  in health care facilities or schools. SIGNS AND SYMPTOMS  Symptoms typically develop 2-3 days after you come in contact with a cold virus. Most viral URIs last 7-10 days. However, viral URIs from the influenza virus (flu virus) can last 14-18 days and are typically more severe. Symptoms may include:   Runny or stuffy (congested) nose.   Sneezing.   Cough.   Sore throat.   Headache.   Fatigue.   Fever.   Loss of appetite.   Pain in your forehead, behind your eyes, and over your cheekbones (sinus pain).  Muscle aches.  DIAGNOSIS  Your health care provider may diagnose a URI by:  Physical exam.  Tests to check that your symptoms are not due to another condition such as:  Strep throat.  Sinusitis.  Pneumonia.  Asthma. TREATMENT  A URI goes away on its own with time. It cannot be cured with medicines, but medicines may be prescribed or recommended to relieve symptoms. Medicines may help:  Reduce your fever.  Reduce your cough.  Relieve nasal congestion. HOME CARE INSTRUCTIONS   Take medicines only as directed by your health care provider.   Gargle warm saltwater or take cough drops to comfort your throat as directed by your health care provider.  Use a warm mist humidifier or inhale steam from a shower to increase air moisture. This may make it easier to breathe.  Drink enough fluid to keep your urine clear or pale yellow.  Eat soups and other clear broths and maintain good nutrition.   Rest as needed.   Return to work when your temperature has returned to normal or as your health care provider advises. You may need to stay home longer to avoid infecting others. You can also use a face mask and careful hand washing to prevent spread of the virus.  Increase the usage of your inhaler if you have asthma.   Do not use any tobacco products, including cigarettes, chewing tobacco, or electronic cigarettes. If you need help quitting, ask your health  care provider. PREVENTION  The best way to protect yourself from getting a cold is to practice good hygiene.   Avoid oral or hand contact with people with cold symptoms.   Wash your hands often if contact occurs.  There is no clear evidence that vitamin C, vitamin E, echinacea, or exercise reduces the chance of developing a cold. However, it is always recommended to get plenty of rest, exercise, and practice good nutrition.  SEEK MEDICAL CARE IF:   You are getting worse rather than better.   Your symptoms are not controlled by medicine.   You have chills.  You have worsening shortness of breath.  You have brown or red mucus.  You have yellow or brown nasal discharge.  You have pain in your face, especially when you bend forward.  You have a fever.  You have swollen neck glands.  You have pain while swallowing.  You have white areas in the back of your throat. SEEK IMMEDIATE MEDICAL CARE IF:   You have severe or persistent:  Headache.  Ear pain.  Sinus pain.  Chest pain.  You have chronic lung disease and any of the following:  Wheezing.  Prolonged cough.  Coughing up blood.  A change in your usual mucus.  You have a stiff neck.  You have changes in your:  Vision.  Hearing.  Thinking.  Mood. MAKE SURE YOU:   Understand these instructions.  Will watch your condition.  Will get help right away if you are not doing well or get worse.   This information is not intended to replace advice given to you by your health care provider. Make sure you discuss any questions you have with your health care provider.   Document Released: 01/25/2001 Document Revised: 12/16/2014 Document Reviewed: 11/06/2013 Elsevier Interactive Patient Education 2016 [employer]   IF you received an x-ray today, you will receive an invoice from [facility]. Please contact [facility] at [phone] with questions or concerns regarding your invoice.    IF you received labwork today, you will receive an invoice from [facility]. Please contact [facility] at [phone] with questions or concerns regarding your invoice.   Our billing staff will not be able to assist you with questions regarding bills from these companies.  You will be contacted with the lab results as soon as they are available. The fastest way to get your results is to activate your My Chart account. Instructions are located on the last page of this paperwork. If you have not heard from us regarding the results in 2 weeks, please contact this office.

## 2016-06-15 ENCOUNTER — Telehealth: Payer: Self-pay

## 2016-06-15 NOTE — Telephone Encounter (Signed)
Pt is calling stating Dr. Neva SeatGreene told her she may need to come back in and she wanted to touch base and see if she needs to come back in she still coughing   Contact [phone]

## 2016-06-20 NOTE — Telephone Encounter (Signed)
Tried to call patient to get more information, however, there was no answer.  LMVM.

## 2016-06-20 NOTE — Telephone Encounter (Signed)
PATIENT SAID DR. GREENE TOLD HER HE MIGHT WANT HER TO RETURN FOR A REPEAT CXR BECAUSE HE SAW A SPOT ON HER FILM. SHE IS NOT SURE IF HE WANTED HER TO RETURN FOR HER COUGH AS WELL? SHE SAID IT IS BETTER THAN IT WAS. SHE WANTS TO SEE WHAT HE SAYS BEFORE SHE MAKES AN APPOINTMENT. BEST PHONE [phone] (CELL) PHARMACY CHOICE IS [facility] ON [address].  MBC

## 2016-06-22 NOTE — Telephone Encounter (Signed)
Spoke with pt. Is going to schedule an OV for follow cxr.

## 2016-06-29 ENCOUNTER — Ambulatory Visit (INDEPENDENT_AMBULATORY_CARE_PROVIDER_SITE_OTHER): Payer: 59 | Admitting: Family Medicine

## 2016-06-29 ENCOUNTER — Ambulatory Visit (INDEPENDENT_AMBULATORY_CARE_PROVIDER_SITE_OTHER): Payer: 59

## 2016-06-29 VITALS — BP 126/86 | HR 63 | Temp 98.0°F | Resp 17 | Ht 62.5 in | Wt 153.0 lb

## 2016-06-29 DIAGNOSIS — J189 Pneumonia, unspecified organism: Secondary | ICD-10-CM

## 2016-06-29 DIAGNOSIS — J181 Lobar pneumonia, unspecified organism: Secondary | ICD-10-CM | POA: Diagnosis not present

## 2016-06-29 DIAGNOSIS — R05 Cough: Secondary | ICD-10-CM

## 2016-06-29 DIAGNOSIS — R059 Cough, unspecified: Secondary | ICD-10-CM

## 2016-06-29 MED ORDER — LEVOFLOXACIN 500 MG PO TABS: 500.0000 mg | ORAL_TABLET | Freq: Every day | ORAL | 0 refills | Status: DC

## 2016-06-29 NOTE — Patient Instructions (Addendum)
Your x-ray appears improved today, with some signs of possible bronchitis but no pneumonia at present. Continue Mucinex or Mucinex DM over-the-counter during the day, cough drops/lozenges as needed, rest and fluids. If the cough is not improving into this weekend, I did prescribe the other antibiotic that should treat bronchitis if it is from a bacteria. If you are still not improving into the next week to 10 days, especially if taking the antibiotic, return here or your primary care provider.  Return to the clinic or go to the nearest emergency room if any of your symptoms worsen or new symptoms occur.   Acute Bronchitis, Adult Acute bronchitis is when air tubes (bronchi) in the lungs suddenly get swollen. The condition can make it hard to breathe. It can also cause these symptoms:  A cough.  Coughing up clear, yellow, or green mucus.  Wheezing.  Chest congestion.  Shortness of breath.  A fever.  Body aches.  Chills.  A sore throat. Follow these instructions at home: Medicines  Take over-the-counter and prescription medicines only as told by your doctor.  If you were prescribed an antibiotic medicine, take it as told by your doctor. Do not stop taking the antibiotic even if you start to feel better. General instructions  Rest.  Drink enough fluids to keep your pee (urine) clear or pale yellow.  Avoid smoking and secondhand smoke. If you smoke and you need help quitting, ask your doctor. Quitting will help your lungs heal faster.  Use an inhaler, cool mist vaporizer, or humidifier as told by your doctor.  Keep all follow-up visits as told by your doctor. This is important. How is this prevented? To lower your risk of getting this condition again:  Wash your hands often with soap and water. If you cannot use soap and water, use hand sanitizer.  Avoid contact with people who have cold symptoms.  Try not to touch your hands to your mouth, nose, or eyes.  Make sure to  get the flu shot every year. Contact a doctor if:  Your symptoms do not get better in 2 weeks. Get help right away if:  You cough up blood.  You have chest pain.  You have very bad shortness of breath.  You become dehydrated.  You faint (pass out) or keep feeling like you are going to pass out.  You keep throwing up (vomiting).  You have a very bad headache.  Your fever or chills gets worse. This information is not intended to replace advice given to you by your health care provider. Make sure you discuss any questions you have with your health care provider. Document Released: 01/18/2008 Document Revised: 03/09/2016 Document Reviewed: 01/20/2016 Elsevier Interactive Patient Education  2017 [employer]    IF you received an x-ray today, you will receive an invoice from [facility]. Please contact [facility] at [phone] with questions or concerns regarding your invoice.   IF you received labwork today, you will receive an invoice from [facility]. Please contact [facility] at [phone] with questions or concerns regarding your invoice.   Our billing staff will not be able to assist you with questions regarding bills from these companies.  You will be contacted with the lab results as soon as they are available. The fastest way to get your results is to activate your My Chart account. Instructions are located on the last page of this paperwork. If you have not heard from us regarding the results in 2 weeks, please  contact this office.

## 2016-06-29 NOTE — Progress Notes (Signed)
Subjective:  This chart was scribed for  [name],by Hatice Demirci,scribe, at [facility].  This patient was seen in room 3 and the patient's care was started at 8:24 AM.   Chief Complaint  Patient presents with  . Follow-up    Cough. Pt states still coughing and chest tightness.      Patient ID: Susan Terrell, female    DOB: Dec 13, 1955, 60 y.o.   MRN: [id number]  HPI HPI Comments: Susan Terrell is a 60 y.o. female who [facility] the [facility] for a follow up. Patient was last seen October 24th with a cough and chest congestion for three days.  Few rhonchi on lower lobes. Suspected right middle lobe pneumonia on chest x-ray.  She was advised to start her Azithromycin and follow up in 24-48 hours if not significantly improved.   Patient was compliant with the medication given to her at her last vist and states that she feels better some days but worse on others.  She still has intermittent subjective fever/ chills occasionally as well as coughing spells (worse at night-which brings on some shortness of breath). Patient has associated symptoms of an intermittent headache and loss of appetite.  She states she is not able to eat much but is drinking plenty of water (with lemon).   She has also been using throat lozenges. She was using Mucinex for about 2 weeks and states that it seemed to help alleviate her symptoms. Patient does not have an inhaler at home. She has a history of childhood asthma. Patients husband has symptoms of a cough at home as well.    Patient Active Problem List   Diagnosis Date Noted  . Hypothyroidism 09/06/2015  . Preventative health care 09/01/2015  . Cervical cancer screening 09/01/2015  . Headache 03/01/2015  . Vitamin D deficiency 02/20/2015  . 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 UTI (urinary tract infection) 11/10/2013  . Elevated BP 11/10/2013  . Asthma   . Pure hypercholesterolemia 10/21/2011  . Other  abnormal glucose 10/21/2011   Past Medical History:  Diagnosis Date  . Anal fissure   . Asthma    childhood?  . Cervical cancer screening 09/01/2015  . Chicken pox as a child  . Elevated BP 11/10/2013  . Headache 03/01/2015  . Hypothyroidism 09/06/2015  . IBS (irritable bowel syndrome) 2010  . Measles as a child  . Mumps as a child  . Osteopenia 2013  . Pneumonia   . Preventative health care 09/01/2015  . UTI (lower urinary tract infection)   . Vitamin D deficiency 02/20/2015   Past Surgical History:  Procedure Laterality Date  . ABDOMINAL HYSTERECTOMY    . BREAST SURGERY     left needle biopsy  . INCISE AND DRAIN ABCESS     right groin  . PILONIDAL CYST EXCISION     Allergies  Allergen Reactions  . Bee Venom     Swelling localized  . Codeine   . Iodine   . Macrodantin    Prior to Admission medications   Medication Sig Start Date End Date Taking? Authorizing Provider  SYNTHROID 25 MCG tablet TAKE 1 TABLET(25 MCG) BY MOUTH DAILY BEFORE BREAKFAST 04/13/16  Yes Bradd CanaryStacey A Blyth, MD   Social History   Social History  . Marital status: Married    Spouse name: N/A  . Number of children: N/A  . Years of education: N/A   Occupational History  .  Not on file.   Social History Main Topics  . Smoking status: Never Smoker  . Smokeless tobacco: Never Used  . Alcohol use No  . Drug use: No  . Sexual activity: Yes    Birth control/ protection: Surgical     Comment: lives with husband and daughter, no dietary restrictions.   Other Topics Concern  . Not on file   Social History Narrative  . No narrative on file      Review of Systems  Constitutional: Positive for appetite change.  Eyes: Negative for pain, redness and itching.  Respiratory: Positive for cough. Negative for choking.   Gastrointestinal: Negative for nausea and vomiting.  Musculoskeletal: Negative for neck pain and neck stiffness.  Neurological: Positive for headaches. Negative for syncope and speech  difficulty.       Objective:   Physical Exam  Constitutional: She is oriented to person, place, and time. She appears well-developed and well-nourished. No distress.  HENT:  Head: Normocephalic and atraumatic.  Right Ear: Hearing, tympanic membrane, external ear and ear canal normal.  Left Ear: Hearing, tympanic membrane, external ear and ear canal normal.  Nose: Nose normal.  Mouth/Throat: Oropharynx is clear and moist. No oropharyngeal exudate.  Eyes: Conjunctivae and EOM are normal. Pupils are equal, round, and reactive to light.  Cardiovascular: Normal rate, regular rhythm, normal heart sounds and intact distal pulses.   No murmur heard. Pulmonary/Chest: Effort normal and breath sounds normal. No respiratory distress. She has no wheezes. She has no rhonchi.  Few coarse breath sounds right middle and right lower lobe, greater than the left lower lobe.   Neurological: She is alert and oriented to person, place, and time.  Skin: Skin is warm and dry. No rash noted.  Psychiatric: She has a normal mood and affect. Her behavior is normal.  Vitals reviewed.  Vitals:   06/29/16 0814  BP: 126/86  Pulse: 63  Resp: 17  Temp: 98 F (36.7 C)  TempSrc: Oral  SpO2: 96%  Weight: 153 lb (69.4 kg)  Height: 5' 2.5" (1.588 m)    Dg Chest 2 View  Result Date: 06/29/2016 CLINICAL DATA:  Cough for 4 weeks. EXAM: CHEST  2 VIEW COMPARISON:  PA and lateral chest 09/12/2011 and 06/07/2016. FINDINGS: Chronic peribronchial thickening is identified. No consolidative process, pneumothorax or effusion. Heart size is normal. No focal bony abnormality. IMPRESSION: Chronic bronchitic change without focal process. Electronically Signed   By: Drusilla Kanner M.D.   On: 06/29/2016 08:48   Dg Chest 2 View  Result Date: 06/07/2016 CLINICAL DATA:  Cough.  Lower lobe rhonchi on exam. EXAM: CHEST  2 VIEW COMPARISON:  09/12/2011 FINDINGS: The heart size and mediastinal contours are within normal limits. Mild  airspace opacity is seen in the right middle lobe, suspicious for pneumonia. Left lung is clear. No evidence of pneumothorax or pleural effusion. IMPRESSION: Mild right middle lobe airspace disease, suspicious for pneumonia. Post treatment chest radiograph follow-up is recommended in several weeks to confirm resolution. Electronically Signed   By: Myles Rosenthal M.D.   On: 06/07/2016 11:07         Assessment & Plan:    CORAIMA TIBBS is a 60 y.o. female Cough - Plan: DG Chest 2 View  Pneumonia of right middle lobe due to infectious organism [facility]) - Plan: DG Chest 2 View  Initial right middle lobe pneumonia now with persistent cough, but bronchitic changes only on chest x-ray. Afebrile, reassuring O2 sat and exam overall with  few coarse breath sounds. Possible secondary viral infection or persistent bronchitis cough.  -  Symptomatic care discussed with Mucinex, continue over-the-counter cough drops for now  - If not improving in the next 4-5 days, can start Levaquin 500 mg daily to cover for bronchitis or persistent cough. Side effects and tendinopathy risks discussed.  -RTC precautions given.  Meds ordered this encounter  Medications  . levofloxacin (LEVAQUIN) 500 MG tablet    Sig: Take 1 tablet (500 mg total) by mouth daily.    Dispense:  7 tablet    Refill:  0   Patient Instructions    Your x-ray appears improved today, with some signs of possible bronchitis but no pneumonia at present. Continue Mucinex or Mucinex DM over-the-counter during the day, cough drops/lozenges as needed, rest and fluids. If the cough is not improving into this weekend, I did prescribe the other antibiotic that should treat bronchitis if it is from a bacteria. If you are still not improving into the next week to 10 days, especially if taking the antibiotic, return here or your primary care provider.  Return to the clinic or go to the nearest emergency room if any of your symptoms worsen or new symptoms  occur.   Acute Bronchitis, Adult Acute bronchitis is when air tubes (bronchi) in the lungs suddenly get swollen. The condition can make it hard to breathe. It can also cause these symptoms:  A cough.  Coughing up clear, yellow, or green mucus.  Wheezing.  Chest congestion.  Shortness of breath.  A fever.  Body aches.  Chills.  A sore throat. Follow these instructions at home: Medicines  Take over-the-counter and prescription medicines only as told by your doctor.  If you were prescribed an antibiotic medicine, take it as told by your doctor. Do not stop taking the antibiotic even if you start to feel better. General instructions  Rest.  Drink enough fluids to keep your pee (urine) clear or pale yellow.  Avoid smoking and secondhand smoke. If you smoke and you need help quitting, ask your doctor. Quitting will help your lungs heal faster.  Use an inhaler, cool mist vaporizer, or humidifier as told by your doctor.  Keep all follow-up visits as told by your doctor. This is important. How is this prevented? To lower your risk of getting this condition again:  Wash your hands often with soap and water. If you cannot use soap and water, use hand sanitizer.  Avoid contact with people who have cold symptoms.  Try not to touch your hands to your mouth, nose, or eyes.  Make sure to get the flu shot every year. Contact a doctor if:  Your symptoms do not get better in 2 weeks. Get help right away if:  You cough up blood.  You have chest pain.  You have very bad shortness of breath.  You become dehydrated.  You faint (pass out) or keep feeling like you are going to pass out.  You keep throwing up (vomiting).  You have a very bad headache.  Your fever or chills gets worse. This information is not intended to replace advice given to you by your health care provider. Make sure you discuss any questions you have with your health care provider. Document Released:  01/18/2008 Document Revised: 03/09/2016 Document Reviewed: 01/20/2016 Elsevier Interactive Patient Education  2017 [employer].    IF you received an x-ray today, you will receive an invoice from [facility]. Please contact [facility] at [phone] with  questions or concerns regarding your invoice.   IF you received labwork today, you will receive an invoice from [facility]. Please contact [facility] at [phone] with questions or concerns regarding your invoice.   Our billing staff will not be able to assist you with questions regarding bills from these companies.  You will be contacted with the lab results as soon as they are available. The fastest way to get your results is to activate your My Chart account. Instructions are located on the last page of this paperwork. If you have not heard from us regarding the results in 2 weeks, please contact this office.        I personally performed the services described in this documentation, which was scribed in my presence. The recorded information has been reviewed and considered, and addended by me as needed.   Signed,   Meredith StaggersJeffrey Beatryce Colombo, MD [facility].  06/29/16 9:23 AM

## 2016-07-22 ENCOUNTER — Telehealth: Payer: Self-pay | Admitting: Family Medicine

## 2016-07-22 ENCOUNTER — Other Ambulatory Visit: Payer: Self-pay | Admitting: Family Medicine

## 2016-07-22 DIAGNOSIS — R739 Hyperglycemia, unspecified: Secondary | ICD-10-CM

## 2016-07-22 NOTE — Telephone Encounter (Signed)
Lab entered/pt. Scheduled appt. On Wednesday 07/27/16 for labs and to see Dr. Carmelia RollerWendling for cough/congestion.

## 2016-07-22 NOTE — Telephone Encounter (Signed)
Can use the January labs just no duplicates and add a hgba1c for hyperglycemia

## 2016-07-22 NOTE — Telephone Encounter (Signed)
Spoke to this patient today.  She saw you in January and was to return in 6 months, but did not.  There are active labs in the computer (meaning you entered them at that January appt.) and she never returned to do.  Advise if she can come in to complete those labs? Or just needs an appointment (followup).  She is on thyroid medication. 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 Susan AndreasGladys Taylor).  Cell number to call back [phone]

## 2016-07-27 ENCOUNTER — Encounter: Payer: Self-pay | Admitting: Family Medicine

## 2016-07-27 ENCOUNTER — Ambulatory Visit (INDEPENDENT_AMBULATORY_CARE_PROVIDER_SITE_OTHER): Payer: 59 | Admitting: Family Medicine

## 2016-07-27 ENCOUNTER — Other Ambulatory Visit: Payer: 59

## 2016-07-27 VITALS — BP 130/70 | HR 60 | Temp 98.2°F | Ht 62.5 in | Wt 154.6 lb

## 2016-07-27 DIAGNOSIS — E559 Vitamin D deficiency, unspecified: Secondary | ICD-10-CM

## 2016-07-27 DIAGNOSIS — R739 Hyperglycemia, unspecified: Secondary | ICD-10-CM

## 2016-07-27 DIAGNOSIS — IMO0001 Reserved for inherently not codable concepts without codable children: Secondary | ICD-10-CM

## 2016-07-27 DIAGNOSIS — R05 Cough: Secondary | ICD-10-CM | POA: Diagnosis not present

## 2016-07-27 DIAGNOSIS — E782 Mixed hyperlipidemia: Secondary | ICD-10-CM

## 2016-07-27 DIAGNOSIS — R059 Cough, unspecified: Secondary | ICD-10-CM

## 2016-07-27 DIAGNOSIS — E039 Hypothyroidism, unspecified: Secondary | ICD-10-CM

## 2016-07-27 DIAGNOSIS — R030 Elevated blood-pressure reading, without diagnosis of hypertension: Secondary | ICD-10-CM

## 2016-07-27 LAB — CBC
HEMATOCRIT: 41.8 % (ref 36.0–46.0)
Hemoglobin: 13.9 g/dL (ref 12.0–15.0)
MCHC: 33.2 g/dL (ref 30.0–36.0)
MCV: 90.3 fl (ref 78.0–100.0)
Platelets: 236 10*3/uL (ref 150.0–400.0)
RBC: 4.63 Mil/uL (ref 3.87–5.11)
RDW: 13.9 % (ref 11.5–15.5)
WBC: 5.4 10*3/uL (ref 4.0–10.5)

## 2016-07-27 LAB — COMPREHENSIVE METABOLIC PANEL
ALBUMIN: 3.8 g/dL (ref 3.5–5.2)
ALT: 16 U/L (ref 0–35)
AST: 19 U/L (ref 0–37)
Alkaline Phosphatase: 77 U/L (ref 39–117)
BILIRUBIN TOTAL: 0.4 mg/dL (ref 0.2–1.2)
BUN: 17 mg/dL (ref 6–23)
CALCIUM: 8.9 mg/dL (ref 8.4–10.5)
CO2: 28 mEq/L (ref 19–32)
CREATININE: 0.7 mg/dL (ref 0.40–1.20)
Chloride: 106 mEq/L (ref 96–112)
GFR: 90 mL/min (ref >60.00)
Glucose, Bld: 83 mg/dL (ref 70–99)
Potassium: 3.8 mEq/L (ref 3.5–5.1)
Sodium: 140 mEq/L (ref 135–145)
TOTAL PROTEIN: 6.8 g/dL (ref 6.0–8.3)

## 2016-07-27 LAB — LIPID PANEL
CHOLESTEROL: 174 mg/dL (ref 0–200)
HDL: 53.4 mg/dL (ref >39.00)
LDL Cholesterol: 108 mg/dL — ABNORMAL HIGH (ref 0–99)
NonHDL: 120.28
TRIGLYCERIDES: 59 mg/dL (ref 0.0–149.0)
Total CHOL/HDL Ratio: 3
VLDL: 11.8 mg/dL (ref 0.0–40.0)

## 2016-07-27 LAB — TSH: TSH: 4.39 u[IU]/mL (ref 0.35–4.50)

## 2016-07-27 LAB — VITAMIN D 25 HYDROXY (VIT D DEFICIENCY, FRACTURES): VITD: 14.95 ng/mL — AB (ref 30.00–100.00)

## 2016-07-27 LAB — HEMOGLOBIN A1C: HEMOGLOBIN A1C: 5.6 % (ref 4.6–6.5)

## 2016-07-27 NOTE — Progress Notes (Signed)
Chief Complaint  Patient presents with  . Follow-up    on cough(dry)    Susan Terrell is 60 y.o. and is here for a cough.  Duration: 2 months; starting to improve, but wants to make sure her lungs sound OK Productive? No Associated symptoms: nasal congestion Denies: fever, night sweats, rhinorrhea, sore throat, dyspnea, and weight loss Hx of GERD? No ACEi? No  Childhood asthma- yes, does not use any inhalers (including rescue) Does feel like there has been a post-nasal drip component No hx of smoking or COPD  ROS:  Resp: +Cough Cardio: No chest pain  Past Medical History:  Diagnosis Date  . Anal fissure   . Asthma    childhood?  . Cervical cancer screening 09/01/2015  . Chicken pox as a child  . Elevated BP 11/10/2013  . Headache 03/01/2015  . Hypothyroidism 09/06/2015  . IBS (irritable bowel syndrome) 2010  . Measles as a child  . Mumps as a child  . Osteopenia 2013  . Pneumonia   . Preventative health care 09/01/2015  . UTI (lower urinary tract infection)   . Vitamin D deficiency 02/20/2015   Family History  Problem Relation Age of Onset  . Cancer Father     pancreatic   . Heart attack Brother   . Cancer Mother     colon  . Hyperlipidemia Mother   . Glaucoma Mother   . Thyroid disease Mother   . Hypertension Mother   . Cataracts Sister   . Asthma Daughter   . Hypertension Daughter   . Cataracts Maternal Grandmother   . Alcohol abuse Maternal Grandfather   . Hyperlipidemia Sister   . Glaucoma Sister   . Arthritis Sister     rheumatoid  . Glaucoma Brother   . Hyperlipidemia Brother   . Hypertension Brother   . Other Brother     carotid artery disease  . Colitis Daughter   . Hyperlipidemia Daughter   . Asthma Daughter      Medication List       Accurate as of 07/27/16  7:32 AM. Always use your most recent med list.          SYNTHROID 25 MCG tablet Generic drug:  levothyroxine TAKE 1 TABLET(25 MCG) BY MOUTH DAILY BEFORE BREAKFAST       BP  130/70 (BP Location: Right Arm, Patient Position: Sitting, Cuff Size: Normal)   Pulse 60   Temp 98.2 F (36.8 C) (Oral)   Ht 5' 2.5" (1.588 m)   Wt 154 lb 9.6 oz (70.1 kg)   SpO2 97%   BMI 27.83 kg/m  Gen: Awake, alert, appears stated age HEENT: Ears neg, nares patent without D/C, turbinates unremarkable, Pharynx pink without exudate Neck: Supple, no masses or asymmetry, no tenderness Heart: RRR, no murmurs, no LE edema, no bruits Lungs: CTAB, normal effort, no accessory muscle use Psych: Age appropriate judgement and insight, normal mood and affect  Cough  Recommended PO antihistamines and nasal steroid for symptoms and to treat PND. She is improving so this could very well be a post-infectious etiology.  Continue proper hand hygiene. F/u in 4 weeks if symptoms fail to improve. The patient voiced understanding and agreement to the plan.  Jilda Rocheicholas Paul BraytonWendling, DO 07/27/16 7:32 AM

## 2016-07-27 NOTE — Patient Instructions (Addendum)
Claritin (loratadine), Allegra (fexofenadine), Zyrtec (cetirizine); these are listed in order from weakest to strongest. Generic, and therefore cheaper, options are in the parentheses.   Flonase (fluticasone); nasal spray that is over the counter. 2 sprays each nostril, once daily. Aim towards the same side eye when you spray.  There are available OTC, and the generic versions, which may be cheaper, are in parentheses. Show this to a pharmacist if you have trouble finding any of these items.  If you are still having a cough that is lingering in the next 2-4 weeks, call for an appointment.  Your lungs sound great.

## 2016-10-08 ENCOUNTER — Other Ambulatory Visit: Payer: Self-pay | Admitting: Family Medicine

## 2016-11-06 ENCOUNTER — Other Ambulatory Visit: Payer: Self-pay | Admitting: Family Medicine

## 2017-03-09 ENCOUNTER — Telehealth: Payer: Self-pay | Admitting: Family Medicine

## 2017-03-09 ENCOUNTER — Other Ambulatory Visit: Payer: Self-pay | Admitting: Family Medicine

## 2017-03-09 NOTE — Telephone Encounter (Signed)
Relation to ZO:XWRUpt:self Call back number:[phone] Pharmacy: [facility] [id number] [place] [address] AT [facility] [address] [facility] [address] [phone] (Fax)     Reason for call:  Patient requesting SYNTHROID 25 MCG tablet refill to hold her over until 04/13/17, please advise

## 2017-03-10 MED ORDER — LEVOTHYROXINE SODIUM 25 MCG PO TABS: ORAL_TABLET | ORAL | 0 refills | Status: DC

## 2017-03-10 NOTE — Telephone Encounter (Signed)
rx sent to pharmacy.  PC 

## 2017-04-04 ENCOUNTER — Other Ambulatory Visit: Payer: Self-pay | Admitting: Family Medicine

## 2017-04-13 ENCOUNTER — Ambulatory Visit (INDEPENDENT_AMBULATORY_CARE_PROVIDER_SITE_OTHER): Payer: 59 | Admitting: Family Medicine

## 2017-04-13 ENCOUNTER — Encounter: Payer: Self-pay | Admitting: Family Medicine

## 2017-04-13 VITALS — BP 126/74 | HR 59 | Temp 98.3°F | Ht 62.5 in | Wt 155.6 lb

## 2017-04-13 DIAGNOSIS — Z7289 Other problems related to lifestyle: Secondary | ICD-10-CM | POA: Diagnosis not present

## 2017-04-13 DIAGNOSIS — E7800 Pure hypercholesterolemia, unspecified: Secondary | ICD-10-CM

## 2017-04-13 DIAGNOSIS — E559 Vitamin D deficiency, unspecified: Secondary | ICD-10-CM

## 2017-04-13 DIAGNOSIS — R7309 Other abnormal glucose: Secondary | ICD-10-CM

## 2017-04-13 DIAGNOSIS — E785 Hyperlipidemia, unspecified: Secondary | ICD-10-CM | POA: Diagnosis not present

## 2017-04-13 DIAGNOSIS — E039 Hypothyroidism, unspecified: Secondary | ICD-10-CM | POA: Diagnosis not present

## 2017-04-13 HISTORY — DX: Hyperlipidemia, unspecified: E78.5

## 2017-04-13 LAB — COMPREHENSIVE METABOLIC PANEL
ALT: 16 U/L (ref 0–35)
AST: 21 U/L (ref 0–37)
Albumin: 4 g/dL (ref 3.5–5.2)
Alkaline Phosphatase: 77 U/L (ref 39–117)
BUN: 19 mg/dL (ref 6–23)
CALCIUM: 8.9 mg/dL (ref 8.4–10.5)
CHLORIDE: 105 meq/L (ref 96–112)
CO2: 28 meq/L (ref 19–32)
Creatinine, Ser: 0.76 mg/dL (ref 0.40–1.20)
GFR: 81.66 mL/min (ref >60.00)
GLUCOSE: 87 mg/dL (ref 70–99)
Potassium: 3.8 mEq/L (ref 3.5–5.1)
Sodium: 140 mEq/L (ref 135–145)
Total Bilirubin: 0.4 mg/dL (ref 0.2–1.2)
Total Protein: 7 g/dL (ref 6.0–8.3)

## 2017-04-13 LAB — LIPID PANEL
CHOL/HDL RATIO: 4
Cholesterol: 192 mg/dL (ref 0–200)
HDL: 54.2 mg/dL (ref >39.00)
LDL Cholesterol: 129 mg/dL — ABNORMAL HIGH (ref 0–99)
NONHDL: 138.16
Triglycerides: 47 mg/dL (ref 0.0–149.0)
VLDL: 9.4 mg/dL (ref 0.0–40.0)

## 2017-04-13 LAB — HM MAMMOGRAPHY

## 2017-04-13 LAB — HEMOGLOBIN A1C: Hgb A1c MFr Bld: 5.6 % (ref 4.6–6.5)

## 2017-04-13 LAB — TSH: TSH: 4.89 u[IU]/mL — AB (ref 0.35–4.50)

## 2017-04-13 LAB — VITAMIN D 25 HYDROXY (VIT D DEFICIENCY, FRACTURES): VITD: 18.34 ng/mL — AB (ref 30.00–100.00)

## 2017-04-13 NOTE — Assessment & Plan Note (Signed)
Encouraged heart healthy diet, increase exercise, avoid trans fats, consider a krill oil cap daily 

## 2017-04-13 NOTE — Assessment & Plan Note (Signed)
On Levothyroxine, continue to monitor 

## 2017-04-13 NOTE — Patient Instructions (Signed)
Vitamin D Deficiency °Vitamin D deficiency is when your body does not have enough vitamin D. Vitamin D is important to your body for many reasons: °· It helps the body to absorb two important minerals, called calcium and phosphorus. °· It plays a role in bone health. °· It may help to prevent some diseases, such as diabetes and multiple sclerosis. °· It plays a role in muscle function, including heart function. ° °You can get vitamin D by: °· Eating foods that naturally contain vitamin D. °· Eating or drinking milk or other dairy products that have vitamin D added to them. °· Taking a vitamin D supplement or a multivitamin supplement that contains vitamin D. °· Being in the sun. Your body naturally makes vitamin D when your skin is exposed to sunlight. Your body changes the sunlight into a form of the vitamin that the body can use. ° °If vitamin D deficiency is severe, it can cause a condition in which your bones become soft. In adults, this condition is called osteomalacia. In children, this condition is called rickets. °What are the causes? °Vitamin D deficiency may be caused by: °· Not eating enough foods that contain vitamin D. °· Not getting enough sun exposure. °· Having certain digestive system diseases that make it difficult for your body to absorb vitamin D. These diseases include Crohn disease, chronic pancreatitis, and cystic fibrosis. °· Having a surgery in which a part of the stomach or a part of the small intestine is removed. °· Being obese. °· Having chronic kidney disease or liver disease. ° °What increases the risk? °This condition is more likely to develop in: °· Older people. °· People who do not spend much time outdoors. °· People who live in a long-term care facility. °· People who have had broken bones. °· People with weak or thin bones (osteoporosis). °· People who have a disease or condition that changes how the body absorbs vitamin D. °· People who have dark skin. °· People who take certain  medicines, such as steroid medicines or certain seizure medicines. °· People who are overweight or obese. ° °What are the signs or symptoms? °In mild cases of vitamin D deficiency, there may not be any symptoms. If the condition is severe, symptoms may include: °· Bone pain. °· Muscle pain. °· Falling often. °· Broken bones caused by a minor injury. ° °How is this diagnosed? °This condition is usually diagnosed with a blood test. °How is this treated? °Treatment for this condition may depend on what caused the condition. Treatment options include: °· Taking vitamin D supplements. °· Taking a calcium supplement. Your health care provider will suggest what dose is best for you. ° °Follow these instructions at home: °· Take medicines and supplements only as told by your health care provider. °· Eat foods that contain vitamin D. Choices include: °? Fortified dairy products, cereals, or juices. Fortified means that vitamin D has been added to the food. Check the label on the package to be sure. °? Fatty fish, such as salmon or trout. °? Eggs. °? Oysters. °· Do not use a tanning bed. °· Maintain a healthy weight. Lose weight, if needed. °· Keep all follow-up visits as told by your health care provider. This is important. °Contact a health care provider if: °· Your symptoms do not go away. °· You feel like throwing up (nausea) or you throw up (vomit). °· You have fewer bowel movements than usual or it is difficult for you to have a   bowel movement (constipation). °This information is not intended to replace advice given to you by your health care provider. Make sure you discuss any questions you have with your health care provider. °Document Released: 10/24/2011 Document Revised: 01/13/2016 Document Reviewed: 12/17/2014 °Elsevier Interactive Patient Education © 2018 Elsevier Inc. ° °

## 2017-04-13 NOTE — Assessment & Plan Note (Signed)
hgba1c acceptable, minimize simple carbs. Increase exercise as tolerated.  

## 2017-04-13 NOTE — Assessment & Plan Note (Addendum)
Labs reveal deficiency. Start on Vitamin D 50000 IU caps, 1 cap po weekly x 12 weeks. Disp #4 with 4 rf. Also take daily Vitamin D over the counter. If already taking a daily supplement increase by 1000 IU daily and if not start Vitamin D 2000 IU daily.  

## 2017-04-13 NOTE — Progress Notes (Signed)
Patient ID: Susan Terrell, female   DOB: 1955/11/20, 61 y.o.   MRN: [id number]   Subjective:    Patient ID: Susan Terrell, female    DOB: 12/06/1955, 61 y.o.   MRN: [id number]  Chief Complaint  Patient presents with  . Follow-up  . Hypertension    HPI Patient is in today for follow up. She is struggling with caring for her agining mother the past 2 years. She has a great deal of stress but her physical health has been steady. No recent febrile illness or acute concerns. Denies CP/palp/SOB/HA/congestion/fevers/GI or GU c/o. Taking meds as prescribed. No polyuria or polydipsia. Is trying to minimize simple carbohydrates.   Past Medical History:  Diagnosis Date  . Anal fissure   . Asthma    childhood?  . Cervical cancer screening 09/01/2015  . Chicken pox as a child  . Elevated BP 11/10/2013  . Headache 03/01/2015  . Hyperlipidemia 04/13/2017  . Hypothyroidism 09/06/2015  . IBS (irritable bowel syndrome) 2010  . Measles as a child  . Mumps as a child  . Osteopenia 2013  . Pneumonia   . Preventative health care 09/01/2015  . UTI (lower urinary tract infection)   . Vitamin D deficiency 02/20/2015    Past Surgical History:  Procedure Laterality Date  . ABDOMINAL HYSTERECTOMY    . BREAST SURGERY     left needle biopsy  . INCISE AND DRAIN ABCESS     right groin  . PILONIDAL CYST EXCISION      Family History  Problem Relation Age of Onset  . Cancer Father        pancreatic   . Heart attack Brother   . Cancer Mother        colon  . Hyperlipidemia Mother   . Glaucoma Mother   . Thyroid disease Mother   . Hypertension Mother   . Cataracts Sister   . Asthma Daughter   . Hypertension Daughter   . Cataracts Maternal Grandmother   . Alcohol abuse Maternal Grandfather   . Hyperlipidemia Sister   . Glaucoma Sister   . Arthritis Sister        rheumatoid  . Glaucoma Brother   . Hyperlipidemia Brother   . Hypertension Brother   . Other Brother        carotid artery disease  .  Colitis Daughter   . Hyperlipidemia Daughter   . Asthma Daughter     Social History   Social History  . Marital status: Married    Spouse name: N/A  . Number of children: N/A  . Years of education: N/A   Occupational History  . Not on file.   Social History Main Topics  . Smoking status: Never Smoker  . Smokeless tobacco: Never Used  . Alcohol use No  . Drug use: No  . Sexual activity: Yes    Birth control/ protection: Surgical     Comment: lives with husband and daughter, no dietary restrictions.   Other Topics Concern  . Not on file   Social History Narrative  . No narrative on file    Outpatient Medications Prior to Visit  Medication Sig Dispense Refill  . levothyroxine (SYNTHROID) 25 MCG tablet TAKE 1 TABLET(25 MCG) BY MOUTH DAILY BEFORE BREAKFAST 30 tablet 0   No facility-administered medications prior to visit.     Allergies  Allergen Reactions  . Bee Venom     Swelling localized  . Codeine   . Iodine   .  Macrodantin     Review of Systems  Constitutional: Negative for fever and malaise/fatigue.  HENT: Negative for congestion.   Eyes: Negative for blurred vision.  Respiratory: Negative for shortness of breath.   Cardiovascular: Negative for chest pain, palpitations and leg swelling.  Gastrointestinal: Negative for abdominal pain, blood in stool and nausea.  Genitourinary: Negative for dysuria and frequency.  Musculoskeletal: Negative for falls.  Skin: Negative for rash.  Neurological: Negative for dizziness, loss of consciousness and headaches.  Endo/Heme/Allergies: Negative for environmental allergies.  Psychiatric/Behavioral: Negative for depression. The patient is not nervous/anxious.        Objective:    Physical Exam  Constitutional: She is oriented to person, place, and time. She appears well-developed and well-nourished. No distress.  HENT:  Head: Normocephalic and atraumatic.  Nose: Nose normal.  Eyes: Right eye exhibits no  discharge. Left eye exhibits no discharge.  Neck: Normal range of motion. Neck supple.  Cardiovascular: Normal rate and regular rhythm.   No murmur heard. Pulmonary/Chest: Effort normal and breath sounds normal.  Abdominal: Soft. Bowel sounds are normal. There is no tenderness.  Musculoskeletal: She exhibits no edema.  Neurological: She is alert and oriented to person, place, and time.  Skin: Skin is warm and dry.  Psychiatric: She has a normal mood and affect.  Nursing note and vitals reviewed.   BP 126/74 (BP Location: Right Arm, Patient Position: Sitting, Cuff Size: Normal)   Pulse (!) 59   Temp 98.3 F (36.8 C) (Oral)   Ht 5' 2.5" (1.588 m)   Wt 155 lb 9.6 oz (70.6 kg)   SpO2 96%   BMI 28.01 kg/m  Wt Readings from Last 3 Encounters:  04/13/17 155 lb 9.6 oz (70.6 kg)  07/27/16 154 lb 9.6 oz (70.1 kg)  06/29/16 153 lb (69.4 kg)     Lab Results  Component Value Date   WBC 5.4 07/27/2016   HGB 13.9 07/27/2016   HCT 41.8 07/27/2016   PLT 236.0 07/27/2016   GLUCOSE 87 04/13/2017   CHOL 192 04/13/2017   TRIG 47.0 04/13/2017   HDL 54.20 04/13/2017   LDLCALC 129 (H) 04/13/2017   ALT 16 04/13/2017   AST 21 04/13/2017   NA 140 04/13/2017   K 3.8 04/13/2017   CL 105 04/13/2017   CREATININE 0.76 04/13/2017   BUN 19 04/13/2017   CO2 28 04/13/2017   TSH 4.89 (H) 04/13/2017   HGBA1C 5.6 04/13/2017    Lab Results  Component Value Date   TSH 4.89 (H) 04/13/2017   Lab Results  Component Value Date   WBC 5.4 07/27/2016   HGB 13.9 07/27/2016   HCT 41.8 07/27/2016   MCV 90.3 07/27/2016   PLT 236.0 07/27/2016   Lab Results  Component Value Date   NA 140 04/13/2017   K 3.8 04/13/2017   CO2 28 04/13/2017   GLUCOSE 87 04/13/2017   BUN 19 04/13/2017   CREATININE 0.76 04/13/2017   BILITOT 0.4 04/13/2017   ALKPHOS 77 04/13/2017   AST 21 04/13/2017   ALT 16 04/13/2017   PROT 7.0 04/13/2017   ALBUMIN 4.0 04/13/2017   CALCIUM 8.9 04/13/2017   GFR 81.66 04/13/2017    Lab Results  Component Value Date   CHOL 192 04/13/2017   Lab Results  Component Value Date   HDL 54.20 04/13/2017   Lab Results  Component Value Date   LDLCALC 129 (H) 04/13/2017   Lab Results  Component Value Date   TRIG 47.0 04/13/2017  Lab Results  Component Value Date   CHOLHDL 4 04/13/2017   Lab Results  Component Value Date   HGBA1C 5.6 04/13/2017       Assessment & Plan:   Problem List Items Addressed This Visit    Pure hypercholesterolemia    Encouraged heart healthy diet, increase exercise, avoid trans fats, consider a krill oil cap daily      Other abnormal glucose    hgba1c acceptable, minimize simple carbs. Increase exercise as tolerated.       Relevant Orders   Hemoglobin A1c (Completed)   Comprehensive metabolic panel (Completed)   Vitamin D deficiency    Labs reveal deficiency. Start on Vitamin D 16109 IU caps, 1 cap po weekly x 12 weeks. Disp #4 with 4 rf. Also take daily Vitamin D over the counter. If already taking a daily supplement increase by 1000 IU daily and if not start Vitamin D 2000 IU daily.       Relevant Orders   Comprehensive metabolic panel (Completed)   VITAMIN D 25 Hydroxy (Vit-D Deficiency, Fractures) (Completed)   Hypothyroidism    On Levothyroxine, continue to monitor      Relevant Orders   TSH (Completed)   Hyperlipidemia    Encouraged heart healthy diet, increase exercise, avoid trans fats, consider a krill oil cap daily      Relevant Orders   Lipid panel (Completed)    Other Visit Diagnoses    Other problems related to lifestyle    -  Primary   Relevant Orders   Hepatitis C Antibody (Completed)      Ms. Woodhead does not currently have medications on file.  No orders of the defined types were placed in this encounter.    Danise Edge, MD

## 2017-04-14 ENCOUNTER — Other Ambulatory Visit: Payer: Self-pay

## 2017-04-14 LAB — HEPATITIS C ANTIBODY: HCV Ab: NONREACTIVE

## 2017-04-14 MED ORDER — VITAMIN D (ERGOCALCIFEROL) 1.25 MG (50000 UNIT) PO CAPS: 50000.0000 [IU] | ORAL_CAPSULE | ORAL | 4 refills | Status: DC

## 2017-04-14 MED ORDER — LEVOTHYROXINE SODIUM 50 MCG PO TABS: 50.0000 ug | ORAL_TABLET | Freq: Every day | ORAL | 3 refills | Status: DC

## 2017-04-14 NOTE — Telephone Encounter (Signed)
Per SB start Vit-Susan Terrell 50,000 IU 1qwk #4+4 & Susan Terrell/C Levothyroxine 25mcg and start 50mcg #30+3/faxed to pharm/pt aware and agrees with plan/Scheduled for Dec F/U with repeat labs/thx dmf

## 2017-04-17 NOTE — Assessment & Plan Note (Signed)
Encouraged heart healthy diet, increase exercise, avoid trans fats, consider a krill oil cap daily 

## 2017-04-21 ENCOUNTER — Encounter: Payer: Self-pay | Admitting: Family Medicine

## 2017-04-27 ENCOUNTER — Encounter: Payer: Self-pay | Admitting: Family Medicine

## 2017-04-27 ENCOUNTER — Ambulatory Visit (INDEPENDENT_AMBULATORY_CARE_PROVIDER_SITE_OTHER): Payer: 59

## 2017-04-27 ENCOUNTER — Ambulatory Visit (INDEPENDENT_AMBULATORY_CARE_PROVIDER_SITE_OTHER): Payer: 59 | Admitting: Family Medicine

## 2017-04-27 VITALS — BP 121/76 | HR 62 | Temp 97.3°F | Resp 16 | Ht 62.0 in | Wt 154.0 lb

## 2017-04-27 DIAGNOSIS — M25474 Effusion, right foot: Secondary | ICD-10-CM | POA: Diagnosis not present

## 2017-04-27 DIAGNOSIS — M79671 Pain in right foot: Secondary | ICD-10-CM | POA: Diagnosis not present

## 2017-04-27 DIAGNOSIS — M79604 Pain in right leg: Secondary | ICD-10-CM

## 2017-04-27 NOTE — Progress Notes (Signed)
Subjective:  By signing my name below, I, Susan Terrell, attest that this documentation has been prepared under the direction and in the presence of [name], MD. Electronically Signed: Stann Oresung-Kai Terrell, Scribe. 04/27/2017 , 6:15 PM .  Patient was seen in Room 12 .   Patient ID: Susan QualeDebra T Terrell, female    DOB: [birth year], 61 y.o.   MRN: [id number] Chief Complaint  Patient presents with  . Ankle Injury    fall and twisted right ankle - with swelling to foot   HPI Susan Terrell is a 61 y.o. female  Patient reports her sole caught in a "wave" in the carpet at work when she was rushing to answer a phone about 9 days ago at work. As she was falling, she turned to the right to dodge falling onto a table. She had a coworker who used to work as an Museum[employer]gallery exhibitions [employer], examine her foot. She felt a stinging sensation, and had applied ice over the area as well as elevated. She's been able to weight bear on it. She's noticed that her swelling worsens as the day continues with bruising over the past few days. She denies history of broken foot. She's also applied vinegar over injured foot. She denies any recent prolonged car or air travel.   She works at [employer].   Patient Active Problem List   Diagnosis Date Noted  . Hyperlipidemia 04/13/2017  . Hypothyroidism 09/06/2015  . Preventative health care 09/01/2015  . Cervical cancer screening 09/01/2015  . Headache 03/01/2015  . Vitamin D deficiency 02/20/2015  . 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 UTI (urinary tract infection) 11/10/2013  . Elevated BP 11/10/2013  . Asthma   . Pure hypercholesterolemia 10/21/2011  . Other abnormal glucose 10/21/2011   Past Medical History:  Diagnosis Date  . Anal fissure   . Asthma    childhood?  . Cervical cancer screening 09/01/2015  . Chicken pox as a child  . Elevated BP 11/10/2013  . Headache 03/01/2015  . Hyperlipidemia 04/13/2017  . Hypothyroidism 09/06/2015  . IBS (irritable bowel syndrome) 2010  .  Measles as a child  . Mumps as a child  . Osteopenia 2013  . Pneumonia   . Preventative health care 09/01/2015  . UTI (lower urinary tract infection)   . Vitamin D deficiency 02/20/2015   Past Surgical History:  Procedure Laterality Date  . ABDOMINAL HYSTERECTOMY    . BREAST SURGERY     left needle biopsy  . INCISE AND DRAIN ABCESS     right groin  . PILONIDAL CYST EXCISION     Allergies  Allergen Reactions  . Bee Venom     Swelling localized  . Codeine   . Iodine   . Macrodantin    Prior to Admission medications   Medication Sig Start Date End Date Taking? Authorizing Provider  levothyroxine (SYNTHROID, LEVOTHROID) 50 MCG tablet Take 1 tablet (50 mcg total) by mouth daily. 04/14/17  Yes Bradd CanaryBlyth, Stacey A, MD  Vitamin D, Ergocalciferol, (DRISDOL) 50000 units CAPS capsule Take 1 capsule (50,000 Units total) by mouth every 7 (seven) days. 04/14/17  Yes Bradd CanaryBlyth, Stacey A, MD   Social History   Social History  . Marital status: Married    Spouse name: N/A  . Number of children: N/A  . Years of education: N/A   Occupational History  . Not on file.   Social History Main Topics  . Smoking status: Never Smoker  . Smokeless tobacco: Never Used  .  Alcohol use No  . Drug use: No  . Sexual activity: Yes    Birth control/ protection: Surgical     Comment: lives with husband and daughter, no dietary restrictions.   Other Topics Concern  . Not on file   Social History Narrative  . No narrative on file   Review of Systems  Constitutional: Negative for chills, fatigue, fever and unexpected weight change.  Respiratory: Negative for cough.   Gastrointestinal: Negative for constipation, diarrhea, nausea and vomiting.  Musculoskeletal: Positive for arthralgias and joint swelling. Negative for gait problem.  Skin: Negative for rash and wound.  Neurological: Negative for dizziness, weakness and headaches.       Objective:   Physical Exam  Constitutional: She is oriented to person,  place, and time. She appears well-developed and well-nourished. No distress.  HENT:  Head: Normocephalic and atraumatic.  Eyes: Pupils are equal, round, and reactive to light. EOM are normal.  Neck: Neck supple.  Cardiovascular: Normal rate.   Pulmonary/Chest: Effort normal. No respiratory distress.  Musculoskeletal: Normal range of motion.  Right knee: pain free ROM, fibula head non tender Right calf: non tender Calf circumference 15cm below patella:  LEFT 34cm RIGHT 35cm Right achilles: non tender, negative homan's Right foot: tibfib non tender with squeeze, medial and lateral malleoli non tender, no pain with inversion and eversion at the ankle, negative talar tilt, negative drawer, negative kleiger; navicula non tender, ecchymosis anterior distal foot, distal 3rd-4th metatarsal non tender, tenderness along 5th metatarsal as well as the proximal dorsal foot above the midfoot  Neurological: She is alert and oriented to person, place, and time.  Skin: Skin is warm and dry.  Psychiatric: She has a normal mood and affect. Her behavior is normal.  Nursing note and vitals reviewed.   Vitals:   04/27/17 1717  BP: 121/76  Pulse: 62  Resp: 16  Temp: (!) 97.3 F (36.3 C)  TempSrc: Oral  SpO2: 98%  Weight: 154 lb (69.9 kg)  Height:  (1.575 m)   Dg Foot Complete Right  Result Date: 04/27/2017 CLINICAL DATA:  R lateral foot pain, swelling after injury 9 days ago. EXAM: RIGHT FOOT COMPLETE - 3+ VIEW COMPARISON:  None. FINDINGS: No fracture.  No bone lesion. The joints are normally aligned. Minor first metatarsophalangeal joint osteoarthritis. No other arthropathic changes. Soft tissues are unremarkable. IMPRESSION: No fracture or dislocation. Electronically Signed   By: Amie Portland M.D.   On: 04/27/2017 18:24        Assessment & Plan:   MASHANDA Terrell is a 61 y.o. female Right foot pain - Plan: DG Foot Complete Right, Apply other splint  Right leg pain  Swelling of foot  joint, right  Right foot pain/swelling, with injury 9 days ago. Some faint ecchymosis at the distal foot with distal metatarsal discomfort and third and fourth as well as some proximal fifth metatarsal tenderness. I ankle sprain and midfoot strain possible, without apparent fracture seen on x-ray.   -Postop shoe, Tylenol, elevation, recheck in next 7-10 days for possible repeat x-ray if persistent discomfort.  -Upper leg pain and calf discomfort likely from change in gait with foot pain. RTC precautions if isolated calf pain/worsening swelling, or otherwise worsening.   No orders of the defined types were placed in this encounter.  Patient Instructions   Soreness in your calf and thigh may be due to changing how you are walking. If you notice worsening calf pain or swelling, please return for other  testing and evaluation.  X-ray did not indicate any fracture. I would like you to wear the postop shoe for the next week to 10 days, then recheck if you're still having discomfort as other imaging may be needed. If you have worsening pain or swelling during that time, return sooner. Tylenol is fine to take as needed for pain, okay to elevate foot at night to help with swelling.   IF you received an x-ray today, you will receive an invoice from [facility]. Please contact [facility] at [phone] with questions or concerns regarding your invoice.   IF you received labwork today, you will receive an invoice from [facility]. Please contact [facility] at [phone] with questions or concerns regarding your invoice.   Our billing staff will not be able to assist you with questions regarding bills from these companies.  You will be contacted with the lab results as soon as they are available. The fastest way to get your results is to activate your My Chart account. Instructions are located on the last page of this paperwork. If you have not heard from [place] regarding the results in 2 weeks,  please contact this office.       I personally performed the services described in this documentation, which was scribed in my presence. The recorded information has been reviewed and considered for accuracy and completeness, addended by me as needed, and agree with information above.  Signed,   Meredith Staggers, MD [facility].  04/27/17 6:50 PM

## 2017-04-27 NOTE — Patient Instructions (Addendum)
Soreness in your calf and thigh may be due to changing how you are walking. If you notice worsening calf pain or swelling, please return for other testing and evaluation.  X-ray did not indicate any fracture. I would like you to wear the postop shoe for the next week to 10 days, then recheck if you're still having discomfort as other imaging may be needed. If you have worsening pain or swelling during that time, return sooner. Tylenol is fine to take as needed for pain, okay to elevate foot at night to help with swelling.   IF you received an x-ray today, you will receive an invoice from [facility]. Please contact [facility] at [phone] with questions or concerns regarding your invoice.   IF you received labwork today, you will receive an invoice from [facility]. Please contact [facility] at [phone] with questions or concerns regarding your invoice.   Our billing staff will not be able to assist you with questions regarding bills from these companies.  You will be contacted with the lab results as soon as they are available. The fastest way to get your results is to activate your My Chart account. Instructions are located on the last page of this paperwork. If you have not heard from us regarding the results in 2 weeks, please contact this office.

## 2017-05-08 ENCOUNTER — Ambulatory Visit: Payer: 59 | Admitting: Family Medicine

## 2017-05-18 ENCOUNTER — Ambulatory Visit (INDEPENDENT_AMBULATORY_CARE_PROVIDER_SITE_OTHER): Payer: 59 | Admitting: Family Medicine

## 2017-05-18 ENCOUNTER — Encounter: Payer: Self-pay | Admitting: Family Medicine

## 2017-05-18 VITALS — BP 120/66 | HR 75 | Temp 98.2°F | Resp 16 | Ht 62.0 in | Wt 157.8 lb

## 2017-05-18 DIAGNOSIS — S93401D Sprain of unspecified ligament of right ankle, subsequent encounter: Secondary | ICD-10-CM

## 2017-05-18 DIAGNOSIS — M79671 Pain in right foot: Secondary | ICD-10-CM | POA: Diagnosis not present

## 2017-05-18 NOTE — Patient Instructions (Addendum)
I do not think you need a repeat x-ray today as there is no specific bony tenderness. At this point the exam appears to be consistent with ankle sprain. See information below on starting home exercises.  If needed, you can use an over-the-counter ankle brace for activities that may involve twisting or cutting, but would not recommend frequent use of brace as that can cause increased stiffness or weakness within the ankle. If not continuing to improve over the next 2-3 weeks, or any worsening sooner, please return for recheck.   Ankle Sprain, Phase I Rehab Ask your health care provider which exercises are safe for you. Do exercises exactly as told by your health care provider and adjust them as directed. It is normal to feel mild stretching, pulling, tightness, or discomfort as you do these exercises, but you should stop right away if you feel sudden pain or your pain gets worse.Do not begin these exercises until told by your health care provider. Stretching and range of motion exercises These exercises warm up your muscles and joints and improve the movement and flexibility of your lower leg and ankle. These exercises also help to relieve pain and stiffness. Exercise A: Gastroc and soleus stretch  1. Sit on the floor with your left / right leg extended. 2. Loop a belt or towel around the ball of your left / right foot. The ball of your foot is on the walking surface, right under your toes. 3. Keep your left / right ankle and foot relaxed and keep your knee straight while you use the belt or towel to pull your foot toward you. You should feel a gentle stretch behind your calf or knee. 4. Hold this position for __________ seconds, then release to the starting position. Repeat the exercise with your knee bent. You can put a pillow or a rolled bath towel under your knee to support it. You should feel a stretch deep in your calf or at your Achilles tendon. Repeat each stretch __________ times. Complete  these stretches __________ times a day. Exercise B: Ankle alphabet  1. Sit with your left / right leg supported at the lower leg. ? Do not rest your foot on anything. ? Make sure your foot has room to move freely. 2. Think of your left / right foot as a paintbrush, and move your foot to trace each letter of the alphabet in the air. Keep your hip and knee still while you trace. Make the letters as large as you can without feeling discomfort. 3. Trace every letter from A to Z. Repeat __________ times. Complete this exercise __________ times a day. Strengthening exercises These exercises build strength and endurance in your ankle and lower leg. Endurance is the ability to use your muscles for a long time, even after they get tired. Exercise C: Dorsiflexors  1. Secure a rubber exercise band or tube to an object, such as a table leg, that will stay still when the band is pulled. Secure the other end around your left / right foot. 2. Sit on the floor facing the object, with your left / right leg extended. The band or tube should be slightly tense when your foot is relaxed. 3. Slowly bring your foot toward you, pulling the band tighter. 4. Hold this position for __________ seconds. 5. Slowly return your foot to the starting position. Repeat __________ times. Complete this exercise __________ times a day. Exercise D: Plantar flexors  1. Sit on the floor with your left /  right leg extended. 2. Loop a rubber exercise tube or band around the ball of your left / right foot. The ball of your foot is on the walking surface, right under your toes. ? Hold the ends of the band or tube in your hands. ? The band or tube should be slightly tense when your foot is relaxed. 3. Slowly point your foot and toes downward, pushing them away from you. 4. Hold this position for __________ seconds. 5. Slowly return your foot to the starting position. Repeat __________ times. Complete this exercise __________ times a  day. Exercise E: Evertors 1. Sit on the floor with your legs straight out in front of you. 2. Loop a rubber exercise band or tube around the ball of your left / right foot. The ball of your foot is on the walking surface, right under your toes. ? Hold the ends of the band in your hands, or secure the band to a stable object. ? The band or tube should be slightly tense when your foot is relaxed. 3. Slowly push your foot outward, away from your other leg. 4. Hold this position for __________ seconds. 5. Slowly return your foot to the starting position. Repeat __________ times. Complete this exercise __________ times a day. This information is not intended to replace advice given to you by your health care provider. Make sure you discuss any questions you have with your health care provider. Document Released: 03/02/2005 Document Revised: 04/07/2016 Document Reviewed: 06/15/2015 Elsevier Interactive Patient Education  2018 [employer].    IF you received an x-ray today, you will receive an invoice from [facility]. Please contact [facility] at [phone] with questions or concerns regarding your invoice.   IF you received labwork today, you will receive an invoice from [facility]. Please contact [facility] at [phone] with questions or concerns regarding your invoice.   Our billing staff will not be able to assist you with questions regarding bills from these companies.  You will be contacted with the lab results as soon as they are available. The fastest way to get your results is to activate your My Chart account. Instructions are located on the last page of this paperwork. If you have not heard from [place] regarding the results in 2 weeks, please contact this office.

## 2017-05-18 NOTE — Progress Notes (Signed)
Subjective:  By signing my name below, I, Stann Ore, attest that this documentation has been prepared under the direction and in the presence of Meredith Staggers, MD. Electronically Signed: Stann Ore, Scribe. 05/18/2017 , 4:51 PM .  Patient was seen in Room 12 .   Patient ID: Susan Terrell, female    DOB: 06-16-1956, 61 y.o.   MRN: [id number] Chief Complaint  Patient presents with  . Follow-up    rt foot injury feeling better some   HPI Susan Terrell is a 61 y.o. female Here for follow up of right foot injury. Patient was seen on Sept 13th for for injury that occurred 9 days prior. She had intermittent stinging sensation as well as swelling. She did have some faint ecchymosis on her anterior distal foot and tender at proximal dorsal foot and midfoot, and 3rd-5th metatarsals. Her xray was negative for fracture. She was placed in postop shoe, tylenol for pain, elevation, and advised 7-10 days for possible repeat xray if discomfort persists.   Patient states she was wearing postop shoe as much as possible since she's on her feet a lot; however, she believes may have caused her some back pain due to uneven between her feet. She stopped wearing the postop shoe a couple days ago and back to regular shoes. She reports still having some shooting pain if she's on her feet a lot throughout the day.   Patient Active Problem List   Diagnosis Date Noted  . Hyperlipidemia 04/13/2017  . Hypothyroidism 09/06/2015  . Preventative health care 09/01/2015  . Cervical cancer screening 09/01/2015  . Headache 03/01/2015  . Vitamin D deficiency 02/20/2015  . 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 UTI (urinary tract infection) 11/10/2013  . Elevated BP 11/10/2013  . Asthma   . Pure hypercholesterolemia 10/21/2011  . Other abnormal glucose 10/21/2011   Past Medical History:  Diagnosis Date  . Anal fissure   . Asthma    childhood?  . Cervical cancer screening 09/01/2015  . Chicken pox as a  child  . Elevated BP 11/10/2013  . Headache 03/01/2015  . Hyperlipidemia 04/13/2017  . Hypothyroidism 09/06/2015  . IBS (irritable bowel syndrome) 2010  . Measles as a child  . Mumps as a child  . Osteopenia 2013  . Pneumonia   . Preventative health care 09/01/2015  . UTI (lower urinary tract infection)   . Vitamin D deficiency 02/20/2015   Past Surgical History:  Procedure Laterality Date  . ABDOMINAL HYSTERECTOMY    . BREAST SURGERY     left needle biopsy  . INCISE AND DRAIN ABCESS     right groin  . PILONIDAL CYST EXCISION     Allergies  Allergen Reactions  . Bee Venom     Swelling localized  . Codeine   . Iodine   . Macrodantin    Prior to Admission medications   Medication Sig Start Date End Date Taking? Authorizing Provider  levothyroxine (SYNTHROID, LEVOTHROID) 50 MCG tablet Take 1 tablet (50 mcg total) by mouth daily. 04/14/17   Bradd Canary, MD  Vitamin D, Ergocalciferol, (DRISDOL) 50000 units CAPS capsule Take 1 capsule (50,000 Units total) by mouth every 7 (seven) days. 04/14/17   Bradd Canary, MD   Social History   Social History  . Marital status: Married    Spouse name: N/A  . Number of children: N/A  . Years of education: N/A   Occupational History  . Not on file.   Social  History Main Topics  . Smoking status: Never Smoker  . Smokeless tobacco: Never Used  . Alcohol use No  . Drug use: No  . Sexual activity: Yes    Birth control/ protection: Surgical     Comment: lives with husband and daughter, no dietary restrictions.   Other Topics Concern  . Not on file   Social History Narrative  . No narrative on file   Review of Systems  Constitutional: Negative for chills, fatigue, fever and unexpected weight change.  Respiratory: Negative for cough.   Gastrointestinal: Negative for constipation, diarrhea, nausea and vomiting.  Musculoskeletal: Positive for myalgias.  Skin: Negative for rash and wound.  Neurological: Negative for dizziness,  weakness and headaches.       Objective:   Physical Exam  Constitutional: She is oriented to person, place, and time. She appears well-developed and well-nourished. No distress.  HENT:  Head: Normocephalic and atraumatic.  Eyes: Pupils are equal, round, and reactive to light. EOM are normal.  Neck: Neck supple.  Cardiovascular: Normal rate.   Pulmonary/Chest: Effort normal. No respiratory distress.  Musculoskeletal: Normal range of motion.  Right calf: non tender, no focal bony tenderness or the tibfib; negative homan's, no calf swelling Right foot: navicula non tender, malleoli non tender, 3rd-5th metatarsal non tender, equal ROM of ankles and feet, NVI distally; resisted strength testing pain free at right foot; no focal bony tenderness; no significant laxity with drawer or talar tilt  Neurological: She is alert and oriented to person, place, and time.  Skin: Skin is warm and dry.  Psychiatric: She has a normal mood and affect. Her behavior is normal.  Nursing note and vitals reviewed.   Vitals:   05/18/17 1619  BP: 120/66  Pulse: 75  Resp: 16  Temp: 98.2 F (36.8 C)  SpO2: 98%  Weight: 157 lb 12.8 oz (71.6 kg)  Height:  (1.575 m)      Assessment & Plan:   Susan Terrell is a 61 y.o. female Right foot pain  Sprain of right ankle, unspecified ligament, subsequent encounter Suspected sprain. Deferred XR based on exam at present,   -start home exercise program, over the counter brace or wrap if needed only. rtc precautions if not continuing to improve.   No orders of the defined types were placed in this encounter.  Patient Instructions   I do not think you need a repeat x-ray today as there is no specific bony tenderness. At this point the exam appears to be consistent with ankle sprain. See information below on starting home exercises.  If needed, you can use an over-the-counter ankle brace for activities that may involve twisting or cutting, but would not recommend  frequent use of brace as that can cause increased stiffness or weakness within the ankle. If not continuing to improve over the next 2-3 weeks, or any worsening sooner, please return for recheck.   Ankle Sprain, Phase I Rehab Ask your health care provider which exercises are safe for you. Do exercises exactly as told by your health care provider and adjust them as directed. It is normal to feel mild stretching, pulling, tightness, or discomfort as you do these exercises, but you should stop right away if you feel sudden pain or your pain gets worse.Do not begin these exercises until told by your health care provider. Stretching and range of motion exercises These exercises warm up your muscles and joints and improve the movement and flexibility of your lower leg and ankle.  These exercises also help to relieve pain and stiffness. Exercise A: Gastroc and soleus stretch  1. Sit on the floor with your left / right leg extended. 2. Loop a belt or towel around the ball of your left / right foot. The ball of your foot is on the walking surface, right under your toes. 3. Keep your left / right ankle and foot relaxed and keep your knee straight while you use the belt or towel to pull your foot toward you. You should feel a gentle stretch behind your calf or knee. 4. Hold this position for __________ seconds, then release to the starting position. Repeat the exercise with your knee bent. You can put a pillow or a rolled bath towel under your knee to support it. You should feel a stretch deep in your calf or at your Achilles tendon. Repeat each stretch __________ times. Complete these stretches __________ times a day. Exercise B: Ankle alphabet  1. Sit with your left / right leg supported at the lower leg. ? Do not rest your foot on anything. ? Make sure your foot has room to move freely. 2. Think of your left / right foot as a paintbrush, and move your foot to trace each letter of the alphabet in the air.  Keep your hip and knee still while you trace. Make the letters as large as you can without feeling discomfort. 3. Trace every letter from A to Z. Repeat __________ times. Complete this exercise __________ times a day. Strengthening exercises These exercises build strength and endurance in your ankle and lower leg. Endurance is the ability to use your muscles for a long time, even after they get tired. Exercise C: Dorsiflexors  1. Secure a rubber exercise band or tube to an object, such as a table leg, that will stay still when the band is pulled. Secure the other end around your left / right foot. 2. Sit on the floor facing the object, with your left / right leg extended. The band or tube should be slightly tense when your foot is relaxed. 3. Slowly bring your foot toward you, pulling the band tighter. 4. Hold this position for __________ seconds. 5. Slowly return your foot to the starting position. Repeat __________ times. Complete this exercise __________ times a day. Exercise D: Plantar flexors  1. Sit on the floor with your left / right leg extended. 2. Loop a rubber exercise tube or band around the ball of your left / right foot. The ball of your foot is on the walking surface, right under your toes. ? Hold the ends of the band or tube in your hands. ? The band or tube should be slightly tense when your foot is relaxed. 3. Slowly point your foot and toes downward, pushing them away from you. 4. Hold this position for __________ seconds. 5. Slowly return your foot to the starting position. Repeat __________ times. Complete this exercise __________ times a day. Exercise E: Evertors 1. Sit on the floor with your legs straight out in front of you. 2. Loop a rubber exercise band or tube around the ball of your left / right foot. The ball of your foot is on the walking surface, right under your toes. ? Hold the ends of the band in your hands, or secure the band to a stable object. ? The  band or tube should be slightly tense when your foot is relaxed. 3. Slowly push your foot outward, away from your other leg. 4. Hold  this position for __________ seconds. 5. Slowly return your foot to the starting position. Repeat __________ times. Complete this exercise __________ times a day. This information is not intended to replace advice given to you by your health care provider. Make sure you discuss any questions you have with your health care provider. Document Released: 03/02/2005 Document Revised: 04/07/2016 Document Reviewed: 06/15/2015 Elsevier Interactive Patient Education  2018 [employer].    IF you received an x-ray today, you will receive an invoice from [facility]. Please contact [facility] at [phone] with questions or concerns regarding your invoice.   IF you received labwork today, you will receive an invoice from [facility]. Please contact [facility] at [phone] with questions or concerns regarding your invoice.   Our billing staff will not be able to assist you with questions regarding bills from these companies.  You will be contacted with the lab results as soon as they are available. The fastest way to get your results is to activate your My Chart account. Instructions are located on the last page of this paperwork. If you have not heard from [place] regarding the results in 2 weeks, please contact this office.       I personally performed the services described in this documentation, which was scribed in my presence. The recorded information has been reviewed and considered for accuracy and completeness, addended by me as needed, and agree with information above.  Signed,   Meredith Staggers, MD [facility].  05/20/17 11:33 PM

## 2017-07-18 ENCOUNTER — Ambulatory Visit: Payer: 59 | Admitting: Family Medicine

## 2017-07-18 ENCOUNTER — Encounter: Payer: Self-pay | Admitting: Family Medicine

## 2017-07-18 DIAGNOSIS — E785 Hyperlipidemia, unspecified: Secondary | ICD-10-CM | POA: Diagnosis not present

## 2017-07-18 DIAGNOSIS — E039 Hypothyroidism, unspecified: Secondary | ICD-10-CM | POA: Diagnosis not present

## 2017-07-18 DIAGNOSIS — E559 Vitamin D deficiency, unspecified: Secondary | ICD-10-CM | POA: Diagnosis not present

## 2017-07-18 DIAGNOSIS — R7309 Other abnormal glucose: Secondary | ICD-10-CM | POA: Diagnosis not present

## 2017-07-18 NOTE — Assessment & Plan Note (Signed)
Take daily supplements 

## 2017-07-18 NOTE — Assessment & Plan Note (Signed)
hgba1c acceptable, minimize simple carbs. Increase exercise as tolerated.  

## 2017-07-18 NOTE — Patient Instructions (Signed)
Hypothyroidism Hypothyroidism is a disorder of the thyroid. The thyroid is a large gland that is located in the lower front of the neck. The thyroid releases hormones that control how the body works. With hypothyroidism, the thyroid does not make enough of these hormones. What are the causes? Causes of hypothyroidism may include:  Viral infections.  Pregnancy.  Your own defense system (immune system) attacking your thyroid.  Certain medicines.  Birth defects.  Past radiation treatments to your head or neck.  Past treatment with radioactive iodine.  Past surgical removal of part or all of your thyroid.  Problems with the gland that is located in the center of your brain (pituitary).  What are the signs or symptoms? Signs and symptoms of hypothyroidism may include:  Feeling as though you have no energy (lethargy).  Inability to tolerate cold.  Weight gain that is not explained by a change in diet or exercise habits.  Dry skin.  Coarse hair.  Menstrual irregularity.  Slowing of thought processes.  Constipation.  Sadness or depression.  How is this diagnosed? Your health care provider may diagnose hypothyroidism with blood tests and ultrasound tests. How is this treated? Hypothyroidism is treated with medicine that replaces the hormones that your body does not make. After you begin treatment, it may take several weeks for symptoms to go away. Follow these instructions at home:  Take medicines only as directed by your health care provider.  If you start taking any new medicines, tell your health care provider.  Keep all follow-up visits as directed by your health care provider. This is important. As your condition improves, your dosage needs may change. You will need to have blood tests regularly so that your health care provider can watch your condition. Contact a health care provider if:  Your symptoms do not get better with treatment.  You are taking thyroid  replacement medicine and: ? You sweat excessively. ? You have tremors. ? You feel anxious. ? You lose weight rapidly. ? You cannot tolerate heat. ? You have emotional swings. ? You have diarrhea. ? You feel weak. Get help right away if:  You develop chest pain.  You develop an irregular heartbeat.  You develop a rapid heartbeat. This information is not intended to replace advice given to you by your health care provider. Make sure you discuss any questions you have with your health care provider. Document Released: 08/01/2005 Document Revised: 01/07/2016 Document Reviewed: 12/17/2013 Elsevier Interactive Patient Education  2017 Elsevier Inc.  

## 2017-07-18 NOTE — Assessment & Plan Note (Signed)
Encouraged heart healthy diet, increase exercise, avoid trans fats, consider a krill oil cap daily 

## 2017-07-18 NOTE — Assessment & Plan Note (Addendum)
On Levothyroxine, continue to monitor, TSH wnl

## 2017-07-18 NOTE — Progress Notes (Signed)
Subjective:  I acted as a Neurosurgeon for Dr. Abner Greenspan. Susan Terrell, Susan Terrell  Patient ID: Susan Terrell, female    DOB: 09-02-1955, 61 y.o.   MRN: [id number]  No chief complaint on file.   HPI  Patient is in today for a 3 month follow up and overall she feels well.  No recent febrile illness or acute hospitalizations.  No recent change in heat or cold intolerance.  Good appetite and no skin changes. Denies CP/palp/SOB/HA/congestion/fevers/GI or GU c/o. Taking meds as prescribed  Patient Care Team: Bradd Canary, MD as PCP - General (Family Medicine)   Past Medical History:  Diagnosis Date  . Anal fissure   . Asthma    childhood?  . Cervical cancer screening 09/01/2015  . Chicken pox as a child  . Elevated BP 11/10/2013  . Headache 03/01/2015  . Hyperlipidemia 04/13/2017  . Hypothyroidism 09/06/2015  . IBS (irritable bowel syndrome) 2010  . Measles as a child  . Mumps as a child  . Osteopenia 2013  . Pneumonia   . Preventative health care 09/01/2015  . UTI (lower urinary tract infection)   . Vitamin D deficiency 02/20/2015    Past Surgical History:  Procedure Laterality Date  . ABDOMINAL HYSTERECTOMY    . BREAST SURGERY     left needle biopsy  . INCISE AND DRAIN ABCESS     right groin  . PILONIDAL CYST EXCISION      Family History  Problem Relation Age of Onset  . Cancer Father        pancreatic   . Heart attack Brother   . Cancer Mother        colon  . Hyperlipidemia Mother   . Glaucoma Mother   . Thyroid disease Mother   . Hypertension Mother   . Cataracts Sister   . Asthma Daughter   . Hypertension Daughter   . Cataracts Maternal Grandmother   . Alcohol abuse Maternal Grandfather   . Hyperlipidemia Sister   . Glaucoma Sister   . Arthritis Sister        rheumatoid  . Glaucoma Brother   . Hyperlipidemia Brother   . Hypertension Brother   . Other Brother        carotid artery disease  . Colitis Daughter   . Hyperlipidemia Daughter   . Asthma Daughter     Social  History   Socioeconomic History  . Marital status: Married    Spouse name: Not on file  . Number of children: Not on file  . Years of education: Not on file  . Highest education level: Not on file  Social Needs  . Financial resource strain: Not on file  . Food insecurity - worry: Not on file  . Food insecurity - inability: Not on file  . Transportation needs - medical: Not on file  . Transportation needs - non-medical: Not on file  Occupational History  . Not on file  Tobacco Use  . Smoking status: Never Smoker  . Smokeless tobacco: Never Used  Substance and Sexual Activity  . Alcohol use: No  . Drug use: No  . Sexual activity: Yes    Birth control/protection: Surgical    Comment: lives with husband and daughter, no dietary restrictions.  Other Topics Concern  . Not on file  Social History Narrative  . Not on file    Outpatient Medications Prior to Visit  Medication Sig Dispense Refill  . levothyroxine (SYNTHROID, LEVOTHROID) 50 MCG tablet Take 1 tablet (  50 mcg total) by mouth daily. 30 tablet 3  . Vitamin D, Ergocalciferol, (DRISDOL) 50000 units CAPS capsule Take 1 capsule (50,000 Units total) by mouth every 7 (seven) days. 4 capsule 4   No facility-administered medications prior to visit.     Allergies  Allergen Reactions  . Bee Venom     Swelling localized  . Codeine   . Iodine   . Macrodantin     Review of Systems  Constitutional: Negative for fever and malaise/fatigue.  HENT: Negative for congestion.   Eyes: Negative for blurred vision.  Respiratory: Negative for shortness of breath.   Cardiovascular: Negative for chest pain, palpitations and leg swelling.  Gastrointestinal: Negative for abdominal pain, blood in stool and nausea.  Genitourinary: Negative for dysuria and frequency.  Musculoskeletal: Negative for falls.  Skin: Negative for rash.  Neurological: Negative for dizziness, loss of consciousness and headaches.  Endo/Heme/Allergies: Negative for  environmental allergies.  Psychiatric/Behavioral: Negative for depression. The patient is not nervous/anxious.        Objective:    Physical Exam  Constitutional: She is oriented to person, place, and time. She appears well-developed and well-nourished. No distress.  HENT:  Head: Normocephalic and atraumatic.  Nose: Nose normal.  Eyes: Right eye exhibits no discharge. Left eye exhibits no discharge.  Neck: Normal range of motion. Neck supple.  Cardiovascular: Normal rate and regular rhythm.  No murmur heard. Pulmonary/Chest: Effort normal and breath sounds normal.  Abdominal: Soft. Bowel sounds are normal. There is no tenderness.  Musculoskeletal: She exhibits no edema.  Neurological: She is alert and oriented to person, place, and time.  Skin: Skin is warm and dry.  Psychiatric: She has a normal mood and affect.  Nursing note and vitals reviewed.   BP 118/78 (BP Location: Left Arm, Patient Position: Sitting, Cuff Size: Normal)   Pulse 76   Temp 97.8 F (36.6 C) (Oral)   Resp 18   Wt 158 lb (71.7 kg)   SpO2 95%   BMI 28.90 kg/m  Wt Readings from Last 3 Encounters:  07/18/17 158 lb (71.7 kg)  05/18/17 157 lb 12.8 oz (71.6 kg)  04/27/17 154 lb (69.9 kg)   BP Readings from Last 3 Encounters:  07/18/17 118/78  05/18/17 120/66  04/27/17 121/76     Immunization History  Administered Date(s) Administered  . Tdap 08/15/2010    Health Maintenance  Topic Date Due  . HIV Screening  02/28/1969  . COLONOSCOPY  02/29/2004  . INFLUENZA VACCINE  03/15/2017  . PAP SMEAR  08/31/2018  . MAMMOGRAM  04/14/2019  . TETANUS/TDAP  09/12/2021  . Hepatitis C Screening  Completed    Lab Results  Component Value Date   WBC 5.4 07/27/2016   HGB 13.9 07/27/2016   HCT 41.8 07/27/2016   PLT 236.0 07/27/2016   GLUCOSE 83 07/18/2017   CHOL 192 04/13/2017   TRIG 47.0 04/13/2017   HDL 54.20 04/13/2017   LDLCALC 129 (H) 04/13/2017   ALT 17 07/18/2017   AST 20 07/18/2017   NA 140  07/18/2017   K 4.0 07/18/2017   CL 105 07/18/2017   CREATININE 0.69 07/18/2017   BUN 22 07/18/2017   CO2 27 07/18/2017   TSH 1.64 07/18/2017   HGBA1C 5.6 04/13/2017    Lab Results  Component Value Date   TSH 1.64 07/18/2017   Lab Results  Component Value Date   WBC 5.4 07/27/2016   HGB 13.9 07/27/2016   HCT 41.8 07/27/2016   MCV 90.3  07/27/2016   PLT 236.0 07/27/2016   Lab Results  Component Value Date   NA 140 07/18/2017   K 4.0 07/18/2017   CO2 27 07/18/2017   GLUCOSE 83 07/18/2017   BUN 22 07/18/2017   CREATININE 0.69 07/18/2017   BILITOT 0.3 07/18/2017   ALKPHOS 73 07/18/2017   AST 20 07/18/2017   ALT 17 07/18/2017   PROT 7.2 07/18/2017   ALBUMIN 4.2 07/18/2017   CALCIUM 8.9 07/18/2017   GFR 91.22 07/18/2017   Lab Results  Component Value Date   CHOL 192 04/13/2017   Lab Results  Component Value Date   HDL 54.20 04/13/2017   Lab Results  Component Value Date   LDLCALC 129 (H) 04/13/2017   Lab Results  Component Value Date   TRIG 47.0 04/13/2017   Lab Results  Component Value Date   CHOLHDL 4 04/13/2017   Lab Results  Component Value Date   HGBA1C 5.6 04/13/2017         Assessment & Plan:   Problem List Items Addressed This Visit    Other abnormal glucose    hgba1c acceptable, minimize simple carbs. Increase exercise as tolerated.       Vitamin D deficiency    Take daily supplements      Relevant Orders   Comprehensive metabolic panel (Completed)   VITAMIN D 25 Hydroxy (Vit-D Deficiency, Fractures) (Completed)   Hypothyroidism    On Levothyroxine, continue to monitor, TSH wnl      Relevant Orders   TSH (Completed)   T4, free (Completed)   Hyperlipidemia    Encouraged heart healthy diet, increase exercise, avoid trans fats, consider a krill oil cap daily         I am having Susan Terrell maintain her levothyroxine and Vitamin D (Ergocalciferol).  No orders of the defined types were placed in this encounter.   CMA  served as Neurosurgeonscribe during this visit. History, Physical and Plan performed by medical provider. Documentation and orders reviewed and attested to.  Danise EdgeStacey Sherrian Nunnelley, MD

## 2017-07-19 LAB — COMPREHENSIVE METABOLIC PANEL
ALBUMIN: 4.2 g/dL (ref 3.5–5.2)
ALT: 17 U/L (ref 0–35)
AST: 20 U/L (ref 0–37)
Alkaline Phosphatase: 73 U/L (ref 39–117)
BILIRUBIN TOTAL: 0.3 mg/dL (ref 0.2–1.2)
BUN: 22 mg/dL (ref 6–23)
CALCIUM: 8.9 mg/dL (ref 8.4–10.5)
CO2: 27 meq/L (ref 19–32)
CREATININE: 0.69 mg/dL (ref 0.40–1.20)
Chloride: 105 mEq/L (ref 96–112)
GFR: 91.22 mL/min (ref >60.00)
Glucose, Bld: 83 mg/dL (ref 70–99)
Potassium: 4 mEq/L (ref 3.5–5.1)
Sodium: 140 mEq/L (ref 135–145)
Total Protein: 7.2 g/dL (ref 6.0–8.3)

## 2017-07-19 LAB — VITAMIN D 25 HYDROXY (VIT D DEFICIENCY, FRACTURES): VITD: 42.64 ng/mL (ref 30.00–100.00)

## 2017-07-19 LAB — TSH: TSH: 1.64 u[IU]/mL (ref 0.35–4.50)

## 2017-07-19 LAB — T4, FREE: FREE T4: 0.9 ng/dL (ref 0.60–1.60)

## 2017-07-20 ENCOUNTER — Ambulatory Visit: Payer: 59 | Admitting: Family Medicine

## 2017-08-24 ENCOUNTER — Other Ambulatory Visit: Payer: Self-pay | Admitting: Family Medicine

## 2017-09-21 ENCOUNTER — Other Ambulatory Visit: Payer: Self-pay | Admitting: Family Medicine

## 2017-10-23 ENCOUNTER — Other Ambulatory Visit: Payer: Self-pay | Admitting: Family Medicine

## 2017-11-16 ENCOUNTER — Ambulatory Visit: Payer: 59 | Admitting: Family Medicine

## 2017-11-25 ENCOUNTER — Other Ambulatory Visit: Payer: Self-pay | Admitting: Family Medicine

## 2017-11-28 ENCOUNTER — Ambulatory Visit: Payer: 59 | Admitting: Family Medicine

## 2017-12-14 ENCOUNTER — Encounter: Payer: Self-pay | Admitting: Family Medicine

## 2017-12-14 ENCOUNTER — Ambulatory Visit: Payer: 59 | Admitting: Family Medicine

## 2017-12-14 VITALS — BP 132/60 | HR 62 | Temp 98.6°F | Resp 16 | Ht 61.81 in | Wt 159.8 lb

## 2017-12-14 DIAGNOSIS — E559 Vitamin D deficiency, unspecified: Secondary | ICD-10-CM

## 2017-12-14 DIAGNOSIS — R1012 Left upper quadrant pain: Secondary | ICD-10-CM

## 2017-12-14 DIAGNOSIS — E785 Hyperlipidemia, unspecified: Secondary | ICD-10-CM

## 2017-12-14 DIAGNOSIS — E039 Hypothyroidism, unspecified: Secondary | ICD-10-CM | POA: Diagnosis not present

## 2017-12-14 DIAGNOSIS — I10 Essential (primary) hypertension: Secondary | ICD-10-CM | POA: Diagnosis not present

## 2017-12-14 DIAGNOSIS — R7309 Other abnormal glucose: Secondary | ICD-10-CM | POA: Diagnosis not present

## 2017-12-14 MED ORDER — VITAMIN D 50 MCG (2000 UT) PO CAPS: 2000.0000 [IU] | ORAL_CAPSULE | Freq: Every day | ORAL | Status: DC

## 2017-12-14 NOTE — Assessment & Plan Note (Signed)
Takes 2000 IU daily but misses a couple of doses a week

## 2017-12-14 NOTE — Assessment & Plan Note (Signed)
hgba1c acceptable, minimize simple carbs. Increase exercise as tolerated.  

## 2017-12-14 NOTE — Patient Instructions (Signed)
Jobst compression stockings. Light weight knee highs, 10-20 mmhg, on in am and off in pm Edema Edema is an abnormal buildup of fluids in your bodytissues. Edema is somewhatdependent on gravity to pull the fluid to the lowest place in your body. That makes the condition more common in the legs and thighs (lower extremities). Painless swelling of the feet and ankles is common and becomes more likely as you get older. It is also common in looser tissues, like around your eyes. When the affected area is squeezed, the fluid may move out of that spot and leave a dent for a few moments. This dent is called pitting. What are the causes? There are many possible causes of edema. Eating too much salt and being on your feet or sitting for a long time can cause edema in your legs and ankles. Hot weather may make edema worse. Common medical causes of edema include:  Heart failure.  Liver disease.  Kidney disease.  Weak blood vessels in your legs.  Cancer.  An injury.  Pregnancy.  Some medications.  Obesity.  What are the signs or symptoms? Edema is usually painless.Your skin may look swollen or shiny. How is this diagnosed? Your health care provider may be able to diagnose edema by asking about your medical history and doing a physical exam. You may need to have tests such as X-rays, an electrocardiogram, or blood tests to check for medical conditions that may cause edema. How is this treated? Edema treatment depends on the cause. If you have heart, liver, or kidney disease, you need the treatment appropriate for these conditions. General treatment may include:  Elevation of the affected body part above the level of your heart.  Compression of the affected body part. Pressure from elastic bandages or support stockings squeezes the tissues and forces fluid back into the blood vessels. This keeps fluid from entering the tissues.  Restriction of fluid and salt intake.  Use of a water pill  (diuretic). These medications are appropriate only for some types of edema. They pull fluid out of your body and make you urinate more often. This gets rid of fluid and reduces swelling, but diuretics can have side effects. Only use diuretics as directed by your health care provider.  Follow these instructions at home:  Keep the affected body part above the level of your heart when you are lying down.  Do not sit still or stand for prolonged periods.  Do not put anything directly under your knees when lying down.  Do not wear constricting clothing or garters on your upper legs.  Exercise your legs to work the fluid back into your blood vessels. This may help the swelling go down.  Wear elastic bandages or support stockings to reduce ankle swelling as directed by your health care provider.  Eat a low-salt diet to reduce fluid if your health care provider recommends it.  Only take medicines as directed by your health care provider. Contact a health care provider if:  Your edema is not responding to treatment.  You have heart, liver, or kidney disease and notice symptoms of edema.  You have edema in your legs that does not improve after elevating them.  You have sudden and unexplained weight gain. Get help right away if:  You develop shortness of breath or chest pain.  You cannot breathe when you lie down.  You develop pain, redness, or warmth in the swollen areas.  You have heart, liver, or kidney disease and suddenly  get edema.  You have a fever and your symptoms suddenly get worse. This information is not intended to replace advice given to you by your health care provider. Make sure you discuss any questions you have with your health care provider. Document Released: 08/01/2005 Document Revised: 01/07/2016 Document Reviewed: 05/24/2013 Elsevier Interactive Patient Education  2017 [employer].

## 2017-12-14 NOTE — Assessment & Plan Note (Signed)
Encouraged heart healthy diet, increase exercise, avoid trans fats, consider a krill oil cap daily 

## 2017-12-14 NOTE — Assessment & Plan Note (Signed)
On Levothyroxine, continue to monitor 

## 2017-12-14 NOTE — Progress Notes (Signed)
Subjective:  I acted as a Neurosurgeon for [employer] Fuller Song, RMA   Patient ID: Susan Terrell, female    DOB: 1955-11-21, 62 y.o.   MRN: [id number]  Chief Complaint  Patient presents with  . Follow-up    HPI  Patient is in today for follow up visit and overall she is feeling well. No recent febrile illness or hospitalizations. She is noting some puffiness in b/l knees. No warmth, redness or injury noted. No weakness or falls. Denies CP/palp/SOB/HA/congestion/fevers/GI or GU c/o. Taking meds as prescribed  Patient Care Team: Bradd Canary, MD as PCP - General (Family Medicine)   Past Medical History:  Diagnosis Date  . Anal fissure   . Asthma    childhood?  . Cervical cancer screening 09/01/2015  . Chicken pox as a child  . Elevated BP 11/10/2013  . Headache 03/01/2015  . Hyperlipidemia 04/13/2017  . Hypothyroidism 09/06/2015  . IBS (irritable bowel syndrome) 2010  . Measles as a child  . Mumps as a child  . Osteopenia 2013  . Pneumonia   . Preventative health care 09/01/2015  . UTI (lower urinary tract infection)   . Vitamin D deficiency 02/20/2015    Past Surgical History:  Procedure Laterality Date  . ABDOMINAL HYSTERECTOMY    . BREAST SURGERY     left needle biopsy  . INCISE AND DRAIN ABCESS     right groin  . PILONIDAL CYST EXCISION      Family History  Problem Relation Age of Onset  . Cancer Father        pancreatic   . Heart attack Brother   . Cancer Mother        colon  . Hyperlipidemia Mother   . Glaucoma Mother   . Thyroid disease Mother   . Hypertension Mother   . Cataracts Sister   . Asthma Daughter   . Hypertension Daughter   . Cataracts Maternal Grandmother   . Alcohol abuse Maternal Grandfather   . Hyperlipidemia Sister   . Glaucoma Sister   . Arthritis Sister        rheumatoid  . Glaucoma Brother   . Hyperlipidemia Brother   . Hypertension Brother   . Other Brother        carotid artery disease  . Colitis Daughter   . Hyperlipidemia  Daughter   . Asthma Daughter     Social History   Socioeconomic History  . Marital status: Married    Spouse name: Not on file  . Number of children: Not on file  . Years of education: Not on file  . Highest education level: Not on file  Occupational History  . Not on file  Social Needs  . Financial resource strain: Not on file  . Food insecurity:    Worry: Not on file    Inability: Not on file  . Transportation needs:    Medical: Not on file    Non-medical: Not on file  Tobacco Use  . Smoking status: Never Smoker  . Smokeless tobacco: Never Used  Substance and Sexual Activity  . Alcohol use: No  . Drug use: No  . Sexual activity: Yes    Birth control/protection: Surgical    Comment: lives with husband and daughter, no dietary restrictions.  Lifestyle  . Physical activity:    Days per week: Not on file    Minutes per session: Not on file  . Stress: Not on file  Relationships  . Social connections:  Talks on phone: Not on file    Gets together: Not on file    Attends religious service: Not on file    Active member of club or organization: Not on file    Attends meetings of clubs or organizations: Not on file    Relationship status: Not on file  . Intimate partner violence:    Fear of current or ex partner: Not on file    Emotionally abused: Not on file    Physically abused: Not on file    Forced sexual activity: Not on file  Other Topics Concern  . Not on file  Social History Narrative  . Not on file    Outpatient Medications Prior to Visit  Medication Sig Dispense Refill  . SYNTHROID 50 MCG tablet TAKE 1 TABLET BY MOUTH DAILY 30 tablet 0  . Vitamin D, Ergocalciferol, (DRISDOL) 50000 units CAPS capsule Take 1 capsule (50,000 Units total) by mouth every 7 (seven) days. 4 capsule 4   No facility-administered medications prior to visit.     Allergies  Allergen Reactions  . Bee Venom     Swelling localized  . Codeine   . Iodine   . Macrodantin      Review of Systems  Constitutional: Negative for fever and malaise/fatigue.  HENT: Negative for congestion.   Eyes: Negative for blurred vision.  Respiratory: Negative for shortness of breath.   Cardiovascular: Negative for chest pain, palpitations and leg swelling.  Gastrointestinal: Negative for abdominal pain, blood in stool and nausea.  Genitourinary: Negative for dysuria and frequency.  Musculoskeletal: Negative for falls.  Skin: Negative for rash.  Neurological: Negative for dizziness, loss of consciousness and headaches.  Endo/Heme/Allergies: Negative for environmental allergies.  Psychiatric/Behavioral: Negative for depression. The patient is not nervous/anxious.        Objective:    Physical Exam  Constitutional: She is oriented to person, place, and time. No distress.  HENT:  Head: Normocephalic and atraumatic.  Right Ear: External ear normal.  Left Ear: External ear normal.  Nose: Nose normal.  Mouth/Throat: Oropharynx is clear and moist. No oropharyngeal exudate.  Eyes: Pupils are equal, round, and reactive to light. Conjunctivae are normal. Right eye exhibits no discharge. Left eye exhibits no discharge. No scleral icterus.  Neck: Normal range of motion. Neck supple. No thyromegaly present.  Cardiovascular: Normal rate, regular rhythm, normal heart sounds and intact distal pulses.  No murmur heard. Pulmonary/Chest: Effort normal and breath sounds normal. No respiratory distress. She has no wheezes. She has no rales.  Abdominal: Soft. Bowel sounds are normal. She exhibits no distension and no mass. There is no tenderness.  Musculoskeletal: Normal range of motion. She exhibits no edema or tenderness.  Lymphadenopathy:    She has no cervical adenopathy.  Neurological: She is alert and oriented to person, place, and time. She has normal reflexes. She displays normal reflexes. No cranial nerve deficit. Coordination normal.  Skin: Skin is warm and dry. No rash noted.  She is not diaphoretic.    BP 132/60 (BP Location: Left Arm, Patient Position: Sitting, Cuff Size: Normal)   Pulse 62   Temp 98.6 F (37 C) (Oral)   Resp 16   Ht 5' 1.81" (1.57 m)   Wt 159 lb 12.8 oz (72.5 kg)   SpO2 98%   BMI 29.41 kg/m  Wt Readings from Last 3 Encounters:  12/14/17 159 lb 12.8 oz (72.5 kg)  07/18/17 158 lb (71.7 kg)  05/18/17 157 lb 12.8 oz (71.6 kg)  BP Readings from Last 3 Encounters:  12/14/17 132/60  07/18/17 118/78  05/18/17 120/66     Immunization History  Administered Date(s) Administered  . Tdap 08/15/2010    Health Maintenance  Topic Date Due  . HIV Screening  02/28/1969  . COLONOSCOPY  02/29/2004  . INFLUENZA VACCINE  03/15/2018  . PAP SMEAR  08/31/2018  . MAMMOGRAM  04/14/2019  . TETANUS/TDAP  09/12/2021  . Hepatitis C Screening  Completed    Lab Results  Component Value Date   WBC 8.0 12/14/2017   HGB 14.3 12/14/2017   HCT 44.0 12/14/2017   PLT 255.0 12/14/2017   GLUCOSE 81 12/14/2017   CHOL 185 12/14/2017   TRIG 57.0 12/14/2017   HDL 59.40 12/14/2017   LDLCALC 114 (H) 12/14/2017   ALT 23 12/14/2017   AST 27 12/14/2017   NA 138 12/14/2017   K 4.0 12/14/2017   CL 103 12/14/2017   CREATININE 0.75 12/14/2017   BUN 18 12/14/2017   CO2 27 12/14/2017   TSH 1.45 12/14/2017   HGBA1C 5.7 12/14/2017    Lab Results  Component Value Date   TSH 1.45 12/14/2017   Lab Results  Component Value Date   WBC 8.0 12/14/2017   HGB 14.3 12/14/2017   HCT 44.0 12/14/2017   MCV 91.8 12/14/2017   PLT 255.0 12/14/2017   Lab Results  Component Value Date   NA 138 12/14/2017   K 4.0 12/14/2017   CO2 27 12/14/2017   GLUCOSE 81 12/14/2017   BUN 18 12/14/2017   CREATININE 0.75 12/14/2017   BILITOT 0.5 12/14/2017   ALKPHOS 83 12/14/2017   AST 27 12/14/2017   ALT 23 12/14/2017   PROT 7.3 12/14/2017   ALBUMIN 4.1 12/14/2017   CALCIUM 9.0 12/14/2017   GFR 82.74 12/14/2017   Lab Results  Component Value Date   CHOL 185 12/14/2017    Lab Results  Component Value Date   HDL 59.40 12/14/2017   Lab Results  Component Value Date   LDLCALC 114 (H) 12/14/2017   Lab Results  Component Value Date   TRIG 57.0 12/14/2017   Lab Results  Component Value Date   CHOLHDL 3 12/14/2017   Lab Results  Component Value Date   HGBA1C 5.7 12/14/2017         Assessment & Plan:   Problem List Items Addressed This Visit    Other abnormal glucose    hgba1c acceptable, minimize simple carbs. Increase exercise as tolerated.       Relevant Orders   Hemoglobin A1c (Completed)   CBC (Completed)   Comprehensive metabolic panel (Completed)   LUQ pain - Primary   Relevant Orders   Amylase (Completed)   Lipase (Completed)   High blood pressure    Well controlled, no changes to meds. Encouraged heart healthy diet such as the DASH diet and exercise as tolerated.       Relevant Orders   CBC (Completed)   Vitamin D deficiency    Takes 2000 IU daily but misses a couple of doses a week      Relevant Orders   CBC (Completed)   VITAMIN D 25 Hydroxy (Vit-D Deficiency, Fractures) (Completed)   Hypothyroidism    On Levothyroxine, continue to monitor      Relevant Orders   Hemoglobin A1c (Completed)   CBC (Completed)   TSH (Completed)   Hyperlipidemia    Encouraged heart healthy diet, increase exercise, avoid trans fats, consider a krill oil cap daily  Relevant Orders   CBC (Completed)   Lipid panel (Completed)      I have discontinued Roxanna T. Val's Vitamin D (Ergocalciferol). I am also having her start on Vitamin D. Additionally, I am having her maintain her SYNTHROID.  Meds ordered this encounter  Medications  . Cholecalciferol (VITAMIN D) 2000 units CAPS    Sig: Take 1 capsule (2,000 Units total) by mouth daily.    Dispense:  30 capsule    CMA served as scribe during this visit. History, Physical and Plan performed by medical provider. Documentation and orders reviewed and attested to.  Danise Edge,  MD

## 2017-12-14 NOTE — Assessment & Plan Note (Signed)
Well controlled, no changes to meds. Encouraged heart healthy diet such as the DASH diet and exercise as tolerated.  °

## 2017-12-15 LAB — CBC
HCT: 44 % (ref 36.0–46.0)
HEMOGLOBIN: 14.3 g/dL (ref 12.0–15.0)
MCHC: 32.5 g/dL (ref 30.0–36.0)
MCV: 91.8 fl (ref 78.0–100.0)
Platelets: 255 10*3/uL (ref 150.0–400.0)
RBC: 4.8 Mil/uL (ref 3.87–5.11)
RDW: 14.2 % (ref 11.5–15.5)
WBC: 8 10*3/uL (ref 4.0–10.5)

## 2017-12-15 LAB — COMPREHENSIVE METABOLIC PANEL
ALK PHOS: 83 U/L (ref 39–117)
ALT: 23 U/L (ref 0–35)
AST: 27 U/L (ref 0–37)
Albumin: 4.1 g/dL (ref 3.5–5.2)
BUN: 18 mg/dL (ref 6–23)
CO2: 27 meq/L (ref 19–32)
Calcium: 9 mg/dL (ref 8.4–10.5)
Chloride: 103 mEq/L (ref 96–112)
Creatinine, Ser: 0.75 mg/dL (ref 0.40–1.20)
GFR: 82.74 mL/min (ref >60.00)
GLUCOSE: 81 mg/dL (ref 70–99)
Potassium: 4 mEq/L (ref 3.5–5.1)
Sodium: 138 mEq/L (ref 135–145)
TOTAL PROTEIN: 7.3 g/dL (ref 6.0–8.3)
Total Bilirubin: 0.5 mg/dL (ref 0.2–1.2)

## 2017-12-15 LAB — VITAMIN D 25 HYDROXY (VIT D DEFICIENCY, FRACTURES): VITD: 24.53 ng/mL — ABNORMAL LOW (ref 30.00–100.00)

## 2017-12-15 LAB — LIPID PANEL
Cholesterol: 185 mg/dL (ref 0–200)
HDL: 59.4 mg/dL (ref >39.00)
LDL Cholesterol: 114 mg/dL — ABNORMAL HIGH (ref 0–99)
NONHDL: 125.8
Total CHOL/HDL Ratio: 3
Triglycerides: 57 mg/dL (ref 0.0–149.0)
VLDL: 11.4 mg/dL (ref 0.0–40.0)

## 2017-12-15 LAB — AMYLASE: Amylase: 68 U/L (ref 27–131)

## 2017-12-15 LAB — TSH: TSH: 1.45 u[IU]/mL (ref 0.35–4.50)

## 2017-12-15 LAB — HEMOGLOBIN A1C: Hgb A1c MFr Bld: 5.7 % (ref 4.6–6.5)

## 2017-12-15 LAB — LIPASE: Lipase: 23 U/L (ref 11.0–59.0)

## 2017-12-19 MED ORDER — VITAMIN D (ERGOCALCIFEROL) 1.25 MG (50000 UNIT) PO CAPS: 50000.0000 [IU] | ORAL_CAPSULE | ORAL | 4 refills | Status: DC

## 2017-12-22 ENCOUNTER — Other Ambulatory Visit: Payer: Self-pay | Admitting: Family Medicine

## 2018-01-25 ENCOUNTER — Other Ambulatory Visit: Payer: Self-pay | Admitting: Family Medicine

## 2018-02-01 ENCOUNTER — Telehealth: Payer: Self-pay | Admitting: Family Medicine

## 2018-02-01 NOTE — Telephone Encounter (Signed)
Please advise 

## 2018-02-01 NOTE — Telephone Encounter (Signed)
Copied from CRM [phone]. Topic: Quick Communication - See Telephone Encounter >> Feb 01, 2018  7:16 AM Susan Terrell, Rosey Batheresa D wrote: CRM for notification. See Telephone encounter for: 02/01/18. Patient has a rash on her buttons and wants to know if Dr. Abner GreenspanBlyth can give her something for it or if she can sen dher a picture of it. Pt husband just pass away last night and does not have a way to come into the office. Please call patient back, thanks.

## 2018-02-01 NOTE — Telephone Encounter (Signed)
Rash on her buttons? Bottom? Is it itchy, painful burning? I have to figure out if I think it is shingles vs a contact dermatitis so I need more info then I will try and treat without seeing her.

## 2018-02-05 NOTE — Telephone Encounter (Signed)
Called patient left vmail for patient to call the office back  

## 2018-04-24 ENCOUNTER — Other Ambulatory Visit: Payer: Self-pay | Admitting: Family Medicine

## 2018-05-21 ENCOUNTER — Ambulatory Visit (INDEPENDENT_AMBULATORY_CARE_PROVIDER_SITE_OTHER): Payer: PRIVATE HEALTH INSURANCE | Admitting: Family Medicine

## 2018-05-21 ENCOUNTER — Encounter: Payer: Self-pay | Admitting: Family Medicine

## 2018-05-21 VITALS — BP 138/80 | HR 55 | Temp 98.0°F | Resp 18 | Wt 148.8 lb

## 2018-05-21 DIAGNOSIS — E039 Hypothyroidism, unspecified: Secondary | ICD-10-CM | POA: Diagnosis not present

## 2018-05-21 DIAGNOSIS — Z1239 Encounter for other screening for malignant neoplasm of breast: Secondary | ICD-10-CM | POA: Diagnosis not present

## 2018-05-21 DIAGNOSIS — R7309 Other abnormal glucose: Secondary | ICD-10-CM

## 2018-05-21 DIAGNOSIS — E785 Hyperlipidemia, unspecified: Secondary | ICD-10-CM | POA: Diagnosis not present

## 2018-05-21 DIAGNOSIS — E559 Vitamin D deficiency, unspecified: Secondary | ICD-10-CM | POA: Diagnosis not present

## 2018-05-21 NOTE — Assessment & Plan Note (Signed)
Supplement and monitor 

## 2018-05-21 NOTE — Assessment & Plan Note (Signed)
Encouraged heart healthy diet, increase exercise, avoid trans fats, consider a krill oil cap daily 

## 2018-05-21 NOTE — Patient Instructions (Addendum)
Melatonin 2-10 mg as needed for insomniaInsomnia Insomnia is a sleep disorder that makes it difficult to fall asleep or to stay asleep. Insomnia can cause tiredness (fatigue), low energy, difficulty concentrating, mood swings, and poor performance at work or school. There are three different ways to classify insomnia:  Difficulty falling asleep.  Difficulty staying asleep.  Waking up too early in the morning.  Any type of insomnia can be long-term (chronic) or short-term (acute). Both are common. Short-term insomnia usually lasts for three months or less. Chronic insomnia occurs at least three times a week for longer than three months. What are the causes? Insomnia may be caused by another condition, situation, or substance, such as:  Anxiety.  Certain medicines.  Gastroesophageal reflux disease (GERD) or other gastrointestinal conditions.  Asthma or other breathing conditions.  Restless legs syndrome, sleep apnea, or other sleep disorders.  Chronic pain.  Menopause. This may include hot flashes.  Stroke.  Abuse of alcohol, tobacco, or illegal drugs.  Depression.  Caffeine.  Neurological disorders, such as Alzheimer disease.  An overactive thyroid (hyperthyroidism).  The cause of insomnia may not be known. What increases the risk? Risk factors for insomnia include:  Gender. Women are more commonly affected than men.  Age. Insomnia is more common as you get older.  Stress. This may involve your professional or personal life.  Income. Insomnia is more common in people with lower income.  Lack of exercise.  Irregular work schedule or night shifts.  Traveling between different time zones.  What are the signs or symptoms? If you have insomnia, trouble falling asleep or trouble staying asleep is the main symptom. This may lead to other symptoms, such as:  Feeling fatigued.  Feeling nervous about going to sleep.  Not feeling rested in the morning.  Having  trouble concentrating.  Feeling irritable, anxious, or depressed.  How is this treated? Treatment for insomnia depends on the cause. If your insomnia is caused by an underlying condition, treatment will focus on addressing the condition. Treatment may also include:  Medicines to help you sleep.  Counseling or therapy.  Lifestyle adjustments.  Follow these instructions at home:  Take medicines only as directed by your health care provider.  Keep regular sleeping and waking hours. Avoid naps.  Keep a sleep diary to help you and your health care provider figure out what could be causing your insomnia. Include: ? When you sleep. ? When you wake up during the night. ? How well you sleep. ? How rested you feel the next day. ? Any side effects of medicines you are taking. ? What you eat and drink.  Make your bedroom a comfortable place where it is easy to fall asleep: ? Put up shades or special blackout curtains to block light from outside. ? Use a white noise machine to block noise. ? Keep the temperature cool.  Exercise regularly as directed by your health care provider. Avoid exercising right before bedtime.  Use relaxation techniques to manage stress. Ask your health care provider to suggest some techniques that may work well for you. These may include: ? Breathing exercises. ? Routines to release muscle tension. ? Visualizing peaceful scenes.  Cut back on alcohol, caffeinated beverages, and cigarettes, especially close to bedtime. These can disrupt your sleep.  Do not overeat or eat spicy foods right before bedtime. This can lead to digestive discomfort that can make it hard for you to sleep.  Limit screen use before bedtime. This includes: ? Watching TV. ?  Using your smartphone, tablet, and computer.  Stick to a routine. This can help you fall asleep faster. Try to do a quiet activity, brush your teeth, and go to bed at the same time each night.  Get out of bed if you  are still awake after 15 minutes of trying to sleep. Keep the lights down, but try reading or doing a quiet activity. When you feel sleepy, go back to bed.  Make sure that you drive carefully. Avoid driving if you feel very sleepy.  Keep all follow-up appointments as directed by your health care provider. This is important. Contact a health care provider if:  You are tired throughout the day or have trouble in your daily routine due to sleepiness.  You continue to have sleep problems or your sleep problems get worse. Get help right away if:  You have serious thoughts about hurting yourself or someone else. This information is not intended to replace advice given to you by your health care provider. Make sure you discuss any questions you have with your health care provider. Document Released: 07/29/2000 Document Revised: 01/01/2016 Document Reviewed: 05/02/2014 Elsevier Interactive Patient Education  [employer]2018 [employer]

## 2018-05-21 NOTE — Assessment & Plan Note (Signed)
hgba1c acceptable, minimize simple carbs. Increase exercise as tolerated.  

## 2018-05-21 NOTE — Assessment & Plan Note (Signed)
On Levothyroxine, continue to monitor 

## 2018-05-22 LAB — VITAMIN D 25 HYDROXY (VIT D DEFICIENCY, FRACTURES): VITD: 49.36 ng/mL (ref 30.00–100.00)

## 2018-05-22 LAB — CBC
HCT: 41.8 % (ref 36.0–46.0)
Hemoglobin: 13.7 g/dL (ref 12.0–15.0)
MCHC: 32.9 g/dL (ref 30.0–36.0)
MCV: 92.3 fl (ref 78.0–100.0)
Platelets: 238 10*3/uL (ref 150.0–400.0)
RBC: 4.54 Mil/uL (ref 3.87–5.11)
RDW: 13.9 % (ref 11.5–15.5)
WBC: 7.6 10*3/uL (ref 4.0–10.5)

## 2018-05-22 LAB — COMPREHENSIVE METABOLIC PANEL
ALK PHOS: 74 U/L (ref 39–117)
ALT: 13 U/L (ref 0–35)
AST: 17 U/L (ref 0–37)
Albumin: 4.3 g/dL (ref 3.5–5.2)
BILIRUBIN TOTAL: 0.4 mg/dL (ref 0.2–1.2)
BUN: 18 mg/dL (ref 6–23)
CO2: 28 meq/L (ref 19–32)
Calcium: 9.2 mg/dL (ref 8.4–10.5)
Chloride: 105 mEq/L (ref 96–112)
Creatinine, Ser: 0.7 mg/dL (ref 0.40–1.20)
GFR: 89.48 mL/min (ref >60.00)
GLUCOSE: 89 mg/dL (ref 70–99)
Potassium: 3.7 mEq/L (ref 3.5–5.1)
SODIUM: 141 meq/L (ref 135–145)
TOTAL PROTEIN: 7.2 g/dL (ref 6.0–8.3)

## 2018-05-22 LAB — TSH: TSH: 1.36 u[IU]/mL (ref 0.35–4.50)

## 2018-05-27 NOTE — Progress Notes (Signed)
Subjective:    Patient ID: Susan Terrell, female    DOB: [birth year], 62 y.o.   MRN: [id number]  No chief complaint on file.   HPI Patient is in today for follow up and overall she is doing well. No recent febrile illness or hospitalizations. She is trying to maintain a heart healthy diet. Denies CP/palp/SOB/HA/congestion/fevers/GI or GU c/o. Taking meds as prescribed  Past Medical History:  Diagnosis Date  . Anal fissure   . Asthma    childhood?  . Cervical cancer screening 09/01/2015  . Chicken pox as a child  . Elevated BP 11/10/2013  . Headache 03/01/2015  . Hyperlipidemia 04/13/2017  . Hypothyroidism 09/06/2015  . IBS (irritable bowel syndrome) 2010  . Measles as a child  . Mumps as a child  . Osteopenia 2013  . Pneumonia   . Preventative health care 09/01/2015  . UTI (lower urinary tract infection)   . Vitamin D deficiency 02/20/2015    Past Surgical History:  Procedure Laterality Date  . ABDOMINAL HYSTERECTOMY    . BREAST SURGERY     left needle biopsy  . INCISE AND DRAIN ABCESS     right groin  . PILONIDAL CYST EXCISION      Family History  Problem Relation Age of Onset  . Cancer Father        pancreatic   . Heart attack Brother   . Cancer Mother        colon  . Hyperlipidemia Mother   . Glaucoma Mother   . Thyroid disease Mother   . Hypertension Mother   . Cataracts Sister   . Asthma Daughter   . Hypertension Daughter   . Cataracts Maternal Grandmother   . Alcohol abuse Maternal Grandfather   . Hyperlipidemia Sister   . Glaucoma Sister   . Arthritis Sister        rheumatoid  . Glaucoma Brother   . Hyperlipidemia Brother   . Hypertension Brother   . Other Brother        carotid artery disease  . Colitis Daughter   . Hyperlipidemia Daughter   . Asthma Daughter     Social History   Socioeconomic History  . Marital status: Married    Spouse name: Not on file  . Number of children: Not on file  . Years of education: Not on file  . Highest  education level: Not on file  Occupational History  . Not on file  Social Needs  . Financial resource strain: Not on file  . Food insecurity:    Worry: Not on file    Inability: Not on file  . Transportation needs:    Medical: Not on file    Non-medical: Not on file  Tobacco Use  . Smoking status: Never Smoker  . Smokeless tobacco: Never Used  Substance and Sexual Activity  . Alcohol use: No  . Drug use: No  . Sexual activity: Yes    Birth control/protection: Surgical    Comment: lives with husband and daughter, no dietary restrictions.  Lifestyle  . Physical activity:    Days per week: Not on file    Minutes per session: Not on file  . Stress: Not on file  Relationships  . Social connections:    Talks on phone: Not on file    Gets together: Not on file    Attends religious service: Not on file    Active member of club or organization: Not on file    Attends  meetings of clubs or organizations: Not on file    Relationship status: Not on file  . Intimate partner violence:    Fear of current or ex partner: Not on file    Emotionally abused: Not on file    Physically abused: Not on file    Forced sexual activity: Not on file  Other Topics Concern  . Not on file  Social History Narrative  . Not on file    Outpatient Medications Prior to Visit  Medication Sig Dispense Refill  . Cholecalciferol (VITAMIN D) 2000 units CAPS Take 1 capsule (2,000 Units total) by mouth daily. 30 capsule   . SYNTHROID 50 MCG tablet TAKE 1 TABLET BY MOUTH DAILY 90 tablet 0  . Vitamin D, Ergocalciferol, (DRISDOL) 50000 units CAPS capsule Take 1 capsule (50,000 Units total) by mouth every 7 (seven) days. 4 capsule 4   No facility-administered medications prior to visit.     Allergies  Allergen Reactions  . Bee Venom     Swelling localized  . Codeine   . Iodine   . Macrodantin     Review of Systems  Constitutional: Negative for fever and malaise/fatigue.  HENT: Negative for congestion.    Eyes: Negative for blurred vision.  Respiratory: Negative for shortness of breath.   Cardiovascular: Negative for chest pain, palpitations and leg swelling.  Gastrointestinal: Negative for abdominal pain, blood in stool and nausea.  Genitourinary: Negative for dysuria and frequency.  Musculoskeletal: Negative for falls.  Skin: Negative for rash.  Neurological: Negative for dizziness, loss of consciousness and headaches.  Endo/Heme/Allergies: Negative for environmental allergies.  Psychiatric/Behavioral: Negative for depression. The patient is nervous/anxious.        Objective:    Physical Exam  Constitutional: She is oriented to person, place, and time. She appears well-developed and well-nourished. No distress.  HENT:  Head: Normocephalic and atraumatic.  Nose: Nose normal.  Eyes: Right eye exhibits no discharge. Left eye exhibits no discharge.  Neck: Normal range of motion. Neck supple.  Cardiovascular: Normal rate and regular rhythm.  No murmur heard. Pulmonary/Chest: Effort normal and breath sounds normal.  Abdominal: Soft. Bowel sounds are normal. There is no tenderness.  Musculoskeletal: She exhibits no edema.  Neurological: She is alert and oriented to person, place, and time.  Skin: Skin is warm and dry.  Psychiatric: She has a normal mood and affect.  Nursing note and vitals reviewed.   BP 138/80 (BP Location: Left Arm, Patient Position: Sitting, Cuff Size: Normal)   Pulse (!) 55   Temp 98 F (36.7 C) (Oral)   Resp 18   Wt 148 lb 12.8 oz (67.5 kg)   SpO2 98%   BMI 27.38 kg/m  Wt Readings from Last 3 Encounters:  05/21/18 148 lb 12.8 oz (67.5 kg)  12/14/17 159 lb 12.8 oz (72.5 kg)  07/18/17 158 lb (71.7 kg)     Lab Results  Component Value Date   WBC 7.6 05/21/2018   HGB 13.7 05/21/2018   HCT 41.8 05/21/2018   PLT 238.0 05/21/2018   GLUCOSE 89 05/21/2018   CHOL 185 12/14/2017   TRIG 57.0 12/14/2017   HDL 59.40 12/14/2017   LDLCALC 114 (H)  12/14/2017   ALT 13 05/21/2018   AST 17 05/21/2018   NA 141 05/21/2018   K 3.7 05/21/2018   CL 105 05/21/2018   CREATININE 0.70 05/21/2018   BUN 18 05/21/2018   CO2 28 05/21/2018   TSH 1.36 05/21/2018   HGBA1C 5.7 12/14/2017  Lab Results  Component Value Date   TSH 1.36 05/21/2018   Lab Results  Component Value Date   WBC 7.6 05/21/2018   HGB 13.7 05/21/2018   HCT 41.8 05/21/2018   MCV 92.3 05/21/2018   PLT 238.0 05/21/2018   Lab Results  Component Value Date   NA 141 05/21/2018   K 3.7 05/21/2018   CO2 28 05/21/2018   GLUCOSE 89 05/21/2018   BUN 18 05/21/2018   CREATININE 0.70 05/21/2018   BILITOT 0.4 05/21/2018   ALKPHOS 74 05/21/2018   AST 17 05/21/2018   ALT 13 05/21/2018   PROT 7.2 05/21/2018   ALBUMIN 4.3 05/21/2018   CALCIUM 9.2 05/21/2018   GFR 89.48 05/21/2018   Lab Results  Component Value Date   CHOL 185 12/14/2017   Lab Results  Component Value Date   HDL 59.40 12/14/2017   Lab Results  Component Value Date   LDLCALC 114 (H) 12/14/2017   Lab Results  Component Value Date   TRIG 57.0 12/14/2017   Lab Results  Component Value Date   CHOLHDL 3 12/14/2017   Lab Results  Component Value Date   HGBA1C 5.7 12/14/2017       Assessment & Plan:   Problem List Items Addressed This Visit    Other abnormal glucose    hgba1c acceptable, minimize simple carbs. Increase exercise as tolerated.       Relevant Orders   CBC (Completed)   Comprehensive metabolic panel (Completed)   Vitamin D deficiency    Supplement and monitor      Relevant Orders   VITAMIN D 25 Hydroxy (Vit-D Deficiency, Fractures) (Completed)   CBC (Completed)   Hypothyroidism    On Levothyroxine, continue to monitor      Relevant Orders   TSH (Completed)   CBC (Completed)   Hyperlipidemia    Encouraged heart healthy diet, increase exercise, avoid trans fats, consider a krill oil cap daily      Relevant Orders   CBC (Completed)    Other Visit Diagnoses     Breast cancer screening    -  Primary   Relevant Orders   MM DIAG BREAST TOMO BILATERAL   DG Bone Density      I am having Samyia T. Molchan maintain her Vitamin D, Vitamin D (Ergocalciferol), and SYNTHROID.  No orders of the defined types were placed in this encounter.    Danise Edge, MD

## 2018-05-30 ENCOUNTER — Telehealth: Payer: Self-pay | Admitting: Family Medicine

## 2018-05-30 NOTE — Telephone Encounter (Signed)
Called patient advised that billing was also called [facility] & they state that the coverage has been termed. Advised patient to call insurance to verify.   Copied from CRM [id number]. Topic: General - Inquiry >> May 25, 2018 10:50 AM Crist Infante wrote: Reason for CRM: pt states kim was checking on an insurance issue for her. She would like kim to call her back thanks

## 2018-07-21 ENCOUNTER — Other Ambulatory Visit: Payer: Self-pay | Admitting: Family Medicine

## 2018-08-20 ENCOUNTER — Ambulatory Visit: Payer: BLUE CROSS/BLUE SHIELD | Admitting: Family Medicine

## 2018-08-20 ENCOUNTER — Encounter: Payer: Self-pay | Admitting: Family Medicine

## 2018-08-20 VITALS — BP 126/82 | HR 66 | Temp 97.3°F | Resp 18 | Wt 147.7 lb

## 2018-08-20 DIAGNOSIS — E559 Vitamin D deficiency, unspecified: Secondary | ICD-10-CM | POA: Diagnosis not present

## 2018-08-20 DIAGNOSIS — E039 Hypothyroidism, unspecified: Secondary | ICD-10-CM | POA: Diagnosis not present

## 2018-08-20 DIAGNOSIS — E785 Hyperlipidemia, unspecified: Secondary | ICD-10-CM

## 2018-08-20 DIAGNOSIS — L659 Nonscarring hair loss, unspecified: Secondary | ICD-10-CM | POA: Diagnosis not present

## 2018-08-20 DIAGNOSIS — R7309 Other abnormal glucose: Secondary | ICD-10-CM

## 2018-08-20 DIAGNOSIS — N390 Urinary tract infection, site not specified: Secondary | ICD-10-CM

## 2018-08-20 NOTE — Patient Instructions (Addendum)
Increase fluid intake, increase protein, start Biotin, Zinc, Fatty acids such as fish or flaxseed oil caps and a multivitamin with minerals.   Recommend calcium intake of 1200 to 1500 mg daily, divided into roughly 3 doses. Best source is the diet and a single dairy serving is about 500 mg, a supplement of calcium citrate once or twice daily to balance diet is fine if not getting enough in diet. Also need Vitamin D 2000 IU caps, 1 cap daily if not already taking vitamin D. Also recommend weight baring exercise on hips and upper body to keep bones strong  AZO cranberry tabs     Alopecia Areata, Adult  Alopecia areata is a condition that causes you to lose hair. You may lose hair on your scalp in patches. In some cases, you may lose all the hair on your scalp (alopecia totalis) or all the hair from your face and body (alopecia universalis). Alopecia areata is an autoimmune disease. This means that your body's defense system (immune system) mistakes normal parts of the body for germs or other things that can make you sick. When you have alopecia areata, the immune system attacks the hair follicles. Alopecia areata usually develops in childhood, but it can develop at any age. For some people, their hair grows back on its own and hair loss does not happen again. For others, their hair may fall out and grow back in cycles. The hair loss may last many years. Having this condition can be emotionally difficult, but it is not dangerous. What are the causes? The cause of this condition is not known. What increases the risk? This condition is more likely to develop in people who have:  A family history of alopecia.  A family history of another autoimmune disease, including type 1 diabetes and rheumatoid arthritis.  Asthma and allergies.  Down syndrome. What are the signs or symptoms? Round spots of patchy hair loss on the scalp is the main symptom of this condition. The spots may be mildly itchy. Other  symptoms include:  Short dark hairs in the bald patches that are wider at the top (exclamation point hairs).  Dents, white spots, or lines in the fingernails or toenails.  Balding and body hair loss. This is rare. How is this diagnosed? This condition is diagnosed based on your symptoms and family history. Your health care provider will also check your scalp skin, teeth, and nails. Your health care provider may refer you to a specialist in hair and skin disorders (dermatologist). You may also have tests, including:  A hair pull test.  Blood tests or other screening tests to check for autoimmune diseases, such as thyroid disease or diabetes.  Skin biopsy to confirm the diagnosis.  A procedure to examine the skin with a lighted magnifying instrument (dermoscopy). How is this treated? There is no cure for alopecia areata. Treatment is aimed at promoting the regrowth of hair and preventing the immune system from overreacting. No single treatment is right for all people with alopecia areata. It depends on the type of hair loss you have and how severe it is. Work with your health care provider to find the best treatment for you. Treatment may include:  Having regular checkups to make sure the condition is not getting worse (watchful waiting).  Steroid creams or pills for 6-8 weeks to stop the immune reaction and help hair to regrow more quickly.  Other topical medicines to alter the immune system response and support the hair growth cycle.  Steroid injections.  Therapy and counseling with a support group or therapist if you are having trouble coping with hair loss. Follow these instructions at home:  Learn as much as you can about your condition.  Apply topical creams only as told by your health care provider.  Take over-the-counter and prescription medicines only as told by your health care provider.  Consider getting a wig or products to make hair look fuller or to cover bald spots,  if you feel uncomfortable with your appearance.  Get therapy or counseling if you are having a hard time coping with hair loss. Ask your health care provider to recommend a counselor or support group.  Keep all follow-up visits as told by your health care provider. This is important. Contact a health care provider if:  Your hair loss gets worse, even with treatment.  You have new symptoms.  You are struggling emotionally. Summary  Alopecia areata is an autoimmune condition that makes your body's defense system (immune system) attack the hair follicles. This causes you to lose hair.  Treatments may include regular checkups to make sure that the condition is not getting worse (watchful waiting), medicines, and steroid injections. This information is not intended to replace advice given to you by your health care provider. Make sure you discuss any questions you have with your health care provider. Document Released: 03/05/2004 Document Revised: 08/19/2016 Document Reviewed: 08/19/2016 Elsevier Interactive Patient Education  2019 [employer].

## 2018-08-20 NOTE — Assessment & Plan Note (Signed)
On Levothyroxine, continue to monitor 

## 2018-08-20 NOTE — Assessment & Plan Note (Signed)
supplement and monitor 

## 2018-08-20 NOTE — Assessment & Plan Note (Signed)
hgba1c acceptable, minimize simple carbs. Increase exercise as tolerated. Continue current meds 

## 2018-08-20 NOTE — Assessment & Plan Note (Signed)
Increase leafy greens, consider increased lean red meat and using cast iron cookware. Continue to monitor, report any concernsl 

## 2018-08-21 LAB — LIPID PANEL
CHOLESTEROL: 199 mg/dL (ref 0–200)
HDL: 58.3 mg/dL (ref >39.00)
LDL CALC: 127 mg/dL — AB (ref 0–99)
NONHDL: 141.13
Total CHOL/HDL Ratio: 3
Triglycerides: 73 mg/dL (ref 0.0–149.0)
VLDL: 14.6 mg/dL (ref 0.0–40.0)

## 2018-08-21 LAB — URINALYSIS, ROUTINE W REFLEX MICROSCOPIC
Bilirubin Urine: NEGATIVE
Ketones, ur: NEGATIVE
Leukocytes, UA: NEGATIVE
Nitrite: NEGATIVE
PH: 5 (ref 5.0–8.0)
Specific Gravity, Urine: >=1.03 — AB (ref 1.000–1.030)
Total Protein, Urine: NEGATIVE
UROBILINOGEN UA: 0.2 (ref 0.0–1.0)
Urine Glucose: NEGATIVE

## 2018-08-21 LAB — COMPREHENSIVE METABOLIC PANEL
ALT: 14 U/L (ref 0–35)
AST: 18 U/L (ref 0–37)
Albumin: 4.1 g/dL (ref 3.5–5.2)
Alkaline Phosphatase: 76 U/L (ref 39–117)
BUN: 21 mg/dL (ref 6–23)
CHLORIDE: 106 meq/L (ref 96–112)
CO2: 28 mEq/L (ref 19–32)
Calcium: 9.5 mg/dL (ref 8.4–10.5)
Creatinine, Ser: 0.76 mg/dL (ref 0.40–1.20)
GFR: 81.31 mL/min (ref >60.00)
GLUCOSE: 82 mg/dL (ref 70–99)
POTASSIUM: 4.4 meq/L (ref 3.5–5.1)
SODIUM: 141 meq/L (ref 135–145)
Total Bilirubin: 0.3 mg/dL (ref 0.2–1.2)
Total Protein: 6.8 g/dL (ref 6.0–8.3)

## 2018-08-21 LAB — CBC
HEMATOCRIT: 43.3 % (ref 36.0–46.0)
Hemoglobin: 14.2 g/dL (ref 12.0–15.0)
MCHC: 32.7 g/dL (ref 30.0–36.0)
MCV: 92.7 fl (ref 78.0–100.0)
Platelets: 249 10*3/uL (ref 150.0–400.0)
RBC: 4.68 Mil/uL (ref 3.87–5.11)
RDW: 14.1 % (ref 11.5–15.5)
WBC: 8.2 10*3/uL (ref 4.0–10.5)

## 2018-08-21 LAB — VITAMIN D 25 HYDROXY (VIT D DEFICIENCY, FRACTURES): VITD: 28.49 ng/mL — ABNORMAL LOW (ref 30.00–100.00)

## 2018-08-21 LAB — TSH: TSH: 1.41 u[IU]/mL (ref 0.35–4.50)

## 2018-08-21 LAB — HEMOGLOBIN A1C: Hgb A1c MFr Bld: 5.6 % (ref 4.6–6.5)

## 2018-08-22 DIAGNOSIS — L659 Nonscarring hair loss, unspecified: Secondary | ICD-10-CM | POA: Insufficient documentation

## 2018-08-22 LAB — URINE CULTURE
MICRO NUMBER:: 21577
SPECIMEN QUALITY:: ADEQUATE

## 2018-08-22 NOTE — Assessment & Plan Note (Signed)
Encouraged to add Biotin, zinc and a fatty acid supplement. Maintain a heart healthy diet and adequate hydration

## 2018-08-22 NOTE — Progress Notes (Signed)
Subjective:    Patient ID: Susan Terrell, female    DOB: 11/25/1955, 63 y.o.   MRN: [id number]  No chief complaint on file.   HPI Patient is in today for follow up. She feels well today but notes persistent hair loss. She is grieving the loss of her husband but feels she is managing well. Denies CP/palp/SOB/HA/congestion/fevers/GI or GU c/o. Taking meds as prescribed no recent febrile illness or hospitalizations.   Past Medical History:  Diagnosis Date  . Anal fissure   . Asthma    childhood?  . Cervical cancer screening 09/01/2015  . Chicken pox as a child  . Elevated BP 11/10/2013  . Headache 03/01/2015  . Hyperlipidemia 04/13/2017  . Hypothyroidism 09/06/2015  . IBS (irritable bowel syndrome) 2010  . Measles as a child  . Mumps as a child  . Osteopenia 2013  . Pneumonia   . Preventative health care 09/01/2015  . UTI (lower urinary tract infection)   . Vitamin D deficiency 02/20/2015    Past Surgical History:  Procedure Laterality Date  . ABDOMINAL HYSTERECTOMY    . BREAST SURGERY     left needle biopsy  . INCISE AND DRAIN ABCESS     right groin  . PILONIDAL CYST EXCISION      Family History  Problem Relation Age of Onset  . Cancer Father        pancreatic   . Heart attack Brother   . Cancer Mother        colon  . Hyperlipidemia Mother   . Glaucoma Mother   . Thyroid disease Mother   . Hypertension Mother   . Cataracts Sister   . Asthma Daughter   . Hypertension Daughter   . Cataracts Maternal Grandmother   . Alcohol abuse Maternal Grandfather   . Hyperlipidemia Sister   . Glaucoma Sister   . Arthritis Sister        rheumatoid  . Glaucoma Brother   . Hyperlipidemia Brother   . Hypertension Brother   . Other Brother        carotid artery disease  . Colitis Daughter   . Hyperlipidemia Daughter   . Asthma Daughter     Social History   Socioeconomic History  . Marital status: Widowed    Spouse name: Not on file  . Number of children: Not on file  .  Years of education: Not on file  . Highest education level: Not on file  Occupational History  . Not on file  Social Needs  . Financial resource strain: Not on file  . Food insecurity:    Worry: Not on file    Inability: Not on file  . Transportation needs:    Medical: Not on file    Non-medical: Not on file  Tobacco Use  . Smoking status: Never Smoker  . Smokeless tobacco: Never Used  Substance and Sexual Activity  . Alcohol use: No  . Drug use: No  . Sexual activity: Yes    Birth control/protection: Surgical    Comment: lives with husband and daughter, no dietary restrictions.  Lifestyle  . Physical activity:    Days per week: Not on file    Minutes per session: Not on file  . Stress: Not on file  Relationships  . Social connections:    Talks on phone: Not on file    Gets together: Not on file    Attends religious service: Not on file    Active member of club  or organization: Not on file    Attends meetings of clubs or organizations: Not on file    Relationship status: Not on file  . Intimate partner violence:    Fear of current or ex partner: Not on file    Emotionally abused: Not on file    Physically abused: Not on file    Forced sexual activity: Not on file  Other Topics Concern  . Not on file  Social History Narrative  . Not on file    Outpatient Medications Prior to Visit  Medication Sig Dispense Refill  . Cholecalciferol (VITAMIN D) 2000 units CAPS Take 1 capsule (2,000 Units total) by mouth daily. 30 capsule   . SYNTHROID 50 MCG tablet TAKE 1 TABLET BY MOUTH DAILY 90 tablet 0  . Vitamin D, Ergocalciferol, (DRISDOL) 50000 units CAPS capsule Take 1 capsule (50,000 Units total) by mouth every 7 (seven) days. 4 capsule 4   No facility-administered medications prior to visit.     Allergies  Allergen Reactions  . Bee Venom     Swelling localized  . Codeine   . Iodine   . Macrodantin     Review of Systems  Constitutional: Negative for fever and  malaise/fatigue.  HENT: Negative for congestion.   Eyes: Negative for blurred vision.  Respiratory: Negative for shortness of breath.   Cardiovascular: Negative for chest pain, palpitations and leg swelling.  Gastrointestinal: Negative for abdominal pain, blood in stool and nausea.  Genitourinary: Negative for dysuria and frequency.  Musculoskeletal: Negative for falls.  Skin: Negative for rash.  Neurological: Negative for dizziness, loss of consciousness and headaches.  Endo/Heme/Allergies: Negative for environmental allergies.  Psychiatric/Behavioral: Negative for depression. The patient is not nervous/anxious.        Objective:    Physical Exam Vitals signs and nursing note reviewed.  Constitutional:      General: She is not in acute distress.    Appearance: She is well-developed.  HENT:     Head: Normocephalic and atraumatic.     Nose: Nose normal.  Eyes:     General:        Right eye: No discharge.        Left eye: No discharge.  Neck:     Musculoskeletal: Normal range of motion and neck supple.  Cardiovascular:     Rate and Rhythm: Normal rate and regular rhythm.     Heart sounds: No murmur.  Pulmonary:     Effort: Pulmonary effort is normal.     Breath sounds: Normal breath sounds.  Abdominal:     General: Bowel sounds are normal.     Palpations: Abdomen is soft.     Tenderness: There is no abdominal tenderness.  Skin:    General: Skin is warm and dry.  Neurological:     Mental Status: She is alert and oriented to person, place, and time.     BP 126/82 (BP Location: Left Arm, Patient Position: Sitting, Cuff Size: Normal)   Pulse 66   Temp (!) 97.3 F (36.3 C) (Oral)   Resp 18   Wt 147 lb 11.2 oz (67 kg)   SpO2 97%   BMI 27.18 kg/m  Wt Readings from Last 3 Encounters:  08/20/18 147 lb 11.2 oz (67 kg)  05/21/18 148 lb 12.8 oz (67.5 kg)  12/14/17 159 lb 12.8 oz (72.5 kg)     Lab Results  Component Value Date   WBC 8.2 08/20/2018   HGB 14.2  08/20/2018   HCT  43.3 08/20/2018   PLT 249.0 08/20/2018   GLUCOSE 82 08/20/2018   CHOL 199 08/20/2018   TRIG 73.0 08/20/2018   HDL 58.30 08/20/2018   LDLCALC 127 (H) 08/20/2018   ALT 14 08/20/2018   AST 18 08/20/2018   NA 141 08/20/2018   K 4.4 08/20/2018   CL 106 08/20/2018   CREATININE 0.76 08/20/2018   BUN 21 08/20/2018   CO2 28 08/20/2018   TSH 1.41 08/20/2018   HGBA1C 5.6 08/20/2018    Lab Results  Component Value Date   TSH 1.41 08/20/2018   Lab Results  Component Value Date   WBC 8.2 08/20/2018   HGB 14.2 08/20/2018   HCT 43.3 08/20/2018   MCV 92.7 08/20/2018   PLT 249.0 08/20/2018   Lab Results  Component Value Date   NA 141 08/20/2018   K 4.4 08/20/2018   CO2 28 08/20/2018   GLUCOSE 82 08/20/2018   BUN 21 08/20/2018   CREATININE 0.76 08/20/2018   BILITOT 0.3 08/20/2018   ALKPHOS 76 08/20/2018   AST 18 08/20/2018   ALT 14 08/20/2018   PROT 6.8 08/20/2018   ALBUMIN 4.1 08/20/2018   CALCIUM 9.5 08/20/2018   GFR 81.31 08/20/2018   Lab Results  Component Value Date   CHOL 199 08/20/2018   Lab Results  Component Value Date   HDL 58.30 08/20/2018   Lab Results  Component Value Date   LDLCALC 127 (H) 08/20/2018   Lab Results  Component Value Date   TRIG 73.0 08/20/2018   Lab Results  Component Value Date   CHOLHDL 3 08/20/2018   Lab Results  Component Value Date   HGBA1C 5.6 08/20/2018       Assessment & Plan:   Problem List Items Addressed This Visit    Other abnormal glucose - Primary    hgba1c acceptable, minimize simple carbs. Increase exercise as tolerated. Continue current meds      Relevant Orders   Hemoglobin A1c (Completed)   Comprehensive metabolic panel (Completed)   UTI (urinary tract infection)    Treated recently. Check UA and urine culture.       Relevant Orders   Urinalysis   Urine Culture (Completed)   Urinalysis, Routine w reflex microscopic (Completed)   Vitamin D deficiency    .supplement and monitor        Relevant Orders   VITAMIN D 25 Hydroxy (Vit-D Deficiency, Fractures) (Completed)   Hypothyroidism    On Levothyroxine, continue to monitor      Relevant Orders   TSH (Completed)   Hyperlipidemia    Increase leafy greens, consider increased lean red meat and using cast iron cookware. Continue to monitor, report any concernsl      Relevant Orders   Lipid panel (Completed)   Hair loss    Encouraged to add Biotin, zinc and a fatty acid supplement. Maintain a heart healthy diet and adequate hydration      Relevant Orders   CBC (Completed)      I have discontinued Jamaia T. Blaustein's Vitamin D (Ergocalciferol). I am also having her maintain her Vitamin D and SYNTHROID.  No orders of the defined types were placed in this encounter.    Danise EdgeStacey Blyth, MD

## 2018-08-22 NOTE — Assessment & Plan Note (Signed)
Treated recently. Check UA and urine culture.

## 2018-10-05 DIAGNOSIS — L821 Other seborrheic keratosis: Secondary | ICD-10-CM | POA: Diagnosis not present

## 2018-10-05 DIAGNOSIS — D485 Neoplasm of uncertain behavior of skin: Secondary | ICD-10-CM | POA: Diagnosis not present

## 2018-10-05 DIAGNOSIS — L739 Follicular disorder, unspecified: Secondary | ICD-10-CM | POA: Diagnosis not present

## 2018-10-24 ENCOUNTER — Other Ambulatory Visit: Payer: Self-pay | Admitting: Family Medicine

## 2019-01-22 ENCOUNTER — Other Ambulatory Visit: Payer: Self-pay | Admitting: Family Medicine

## 2019-02-01 ENCOUNTER — Telehealth: Payer: Self-pay | Admitting: Family Medicine

## 2019-02-01 DIAGNOSIS — Z1231 Encounter for screening mammogram for malignant neoplasm of breast: Secondary | ICD-10-CM | POA: Diagnosis not present

## 2019-02-01 DIAGNOSIS — Z780 Asymptomatic menopausal state: Secondary | ICD-10-CM

## 2019-02-01 DIAGNOSIS — Z803 Family history of malignant neoplasm of breast: Secondary | ICD-10-CM | POA: Diagnosis not present

## 2019-02-01 DIAGNOSIS — M8580 Other specified disorders of bone density and structure, unspecified site: Secondary | ICD-10-CM

## 2019-02-01 LAB — HM MAMMOGRAPHY

## 2019-02-01 NOTE — Telephone Encounter (Signed)
Last bone density test was done 2017, osteopenia. Okay to order a bone density test today as long as is done at the same facility ([facility])

## 2019-02-01 NOTE — Telephone Encounter (Signed)
Order placed

## 2019-02-01 NOTE — Telephone Encounter (Signed)
Patient is having a mammo done today at 4pm. She states she normally has a bone density done at the same time. She said she needs an order sent to [facility] on [address]

## 2019-02-01 NOTE — Telephone Encounter (Signed)
Can you advise in PCP absence if bone density is okay to order for this Pt?

## 2019-03-07 ENCOUNTER — Encounter: Payer: Self-pay | Admitting: Family Medicine

## 2019-03-26 IMAGING — DX DG FOOT COMPLETE 3+V*R*
3 series · 3 of 3 positions shown · non-contrast
Comparison: None.

CLINICAL DATA: R lateral foot pain, swelling after injury 9 days
ago.

EXAM:
RIGHT FOOT COMPLETE - 3+ VIEW

[foot ap]
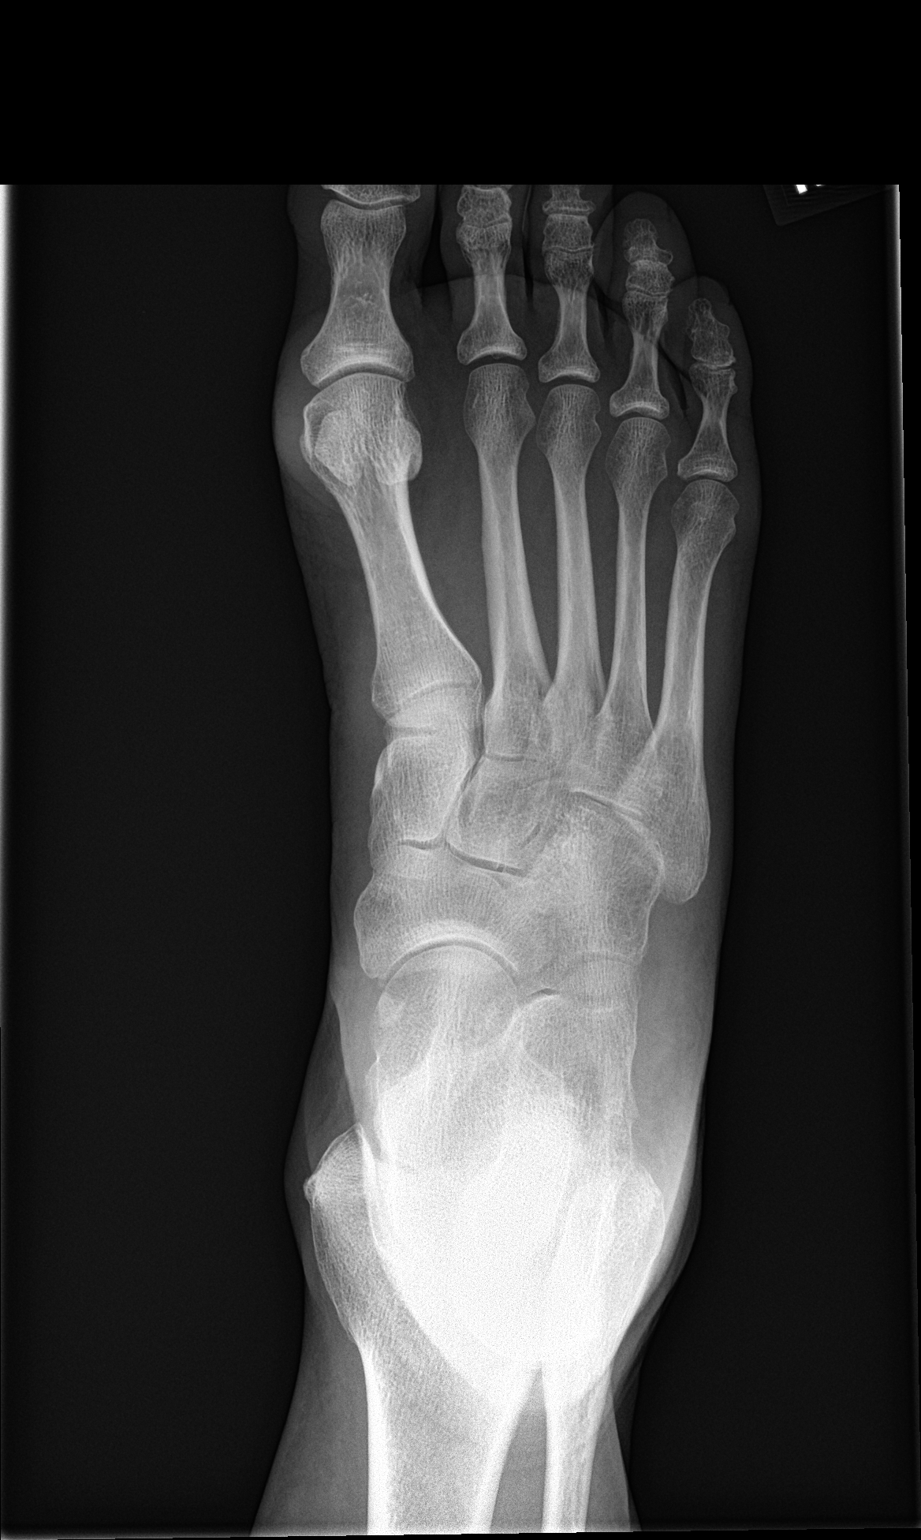

[foot obl]
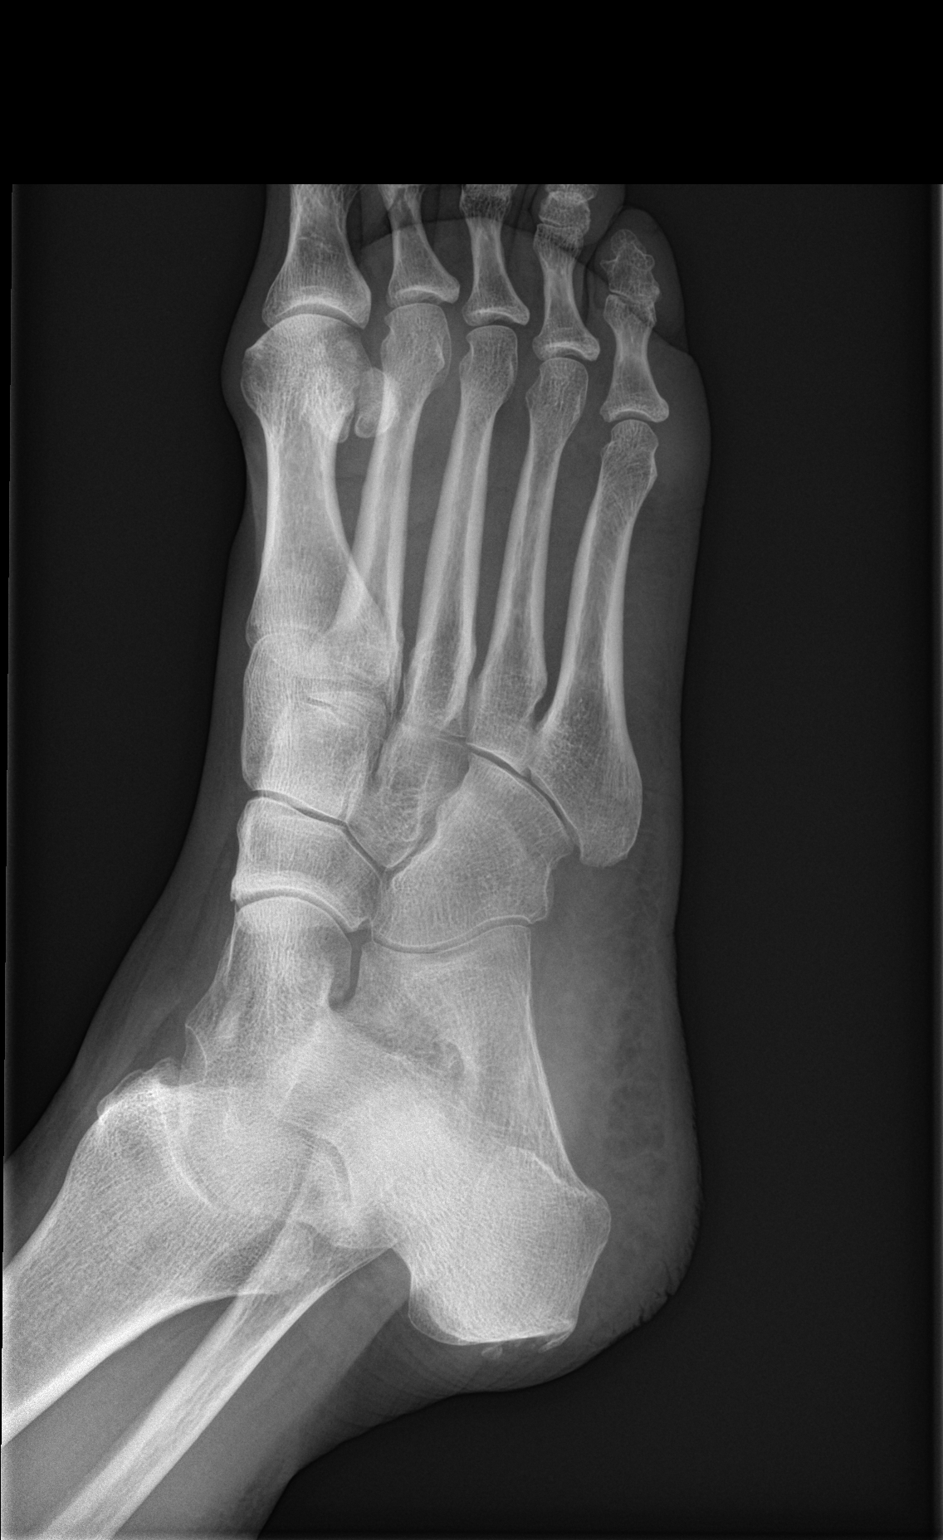

[foot lat]
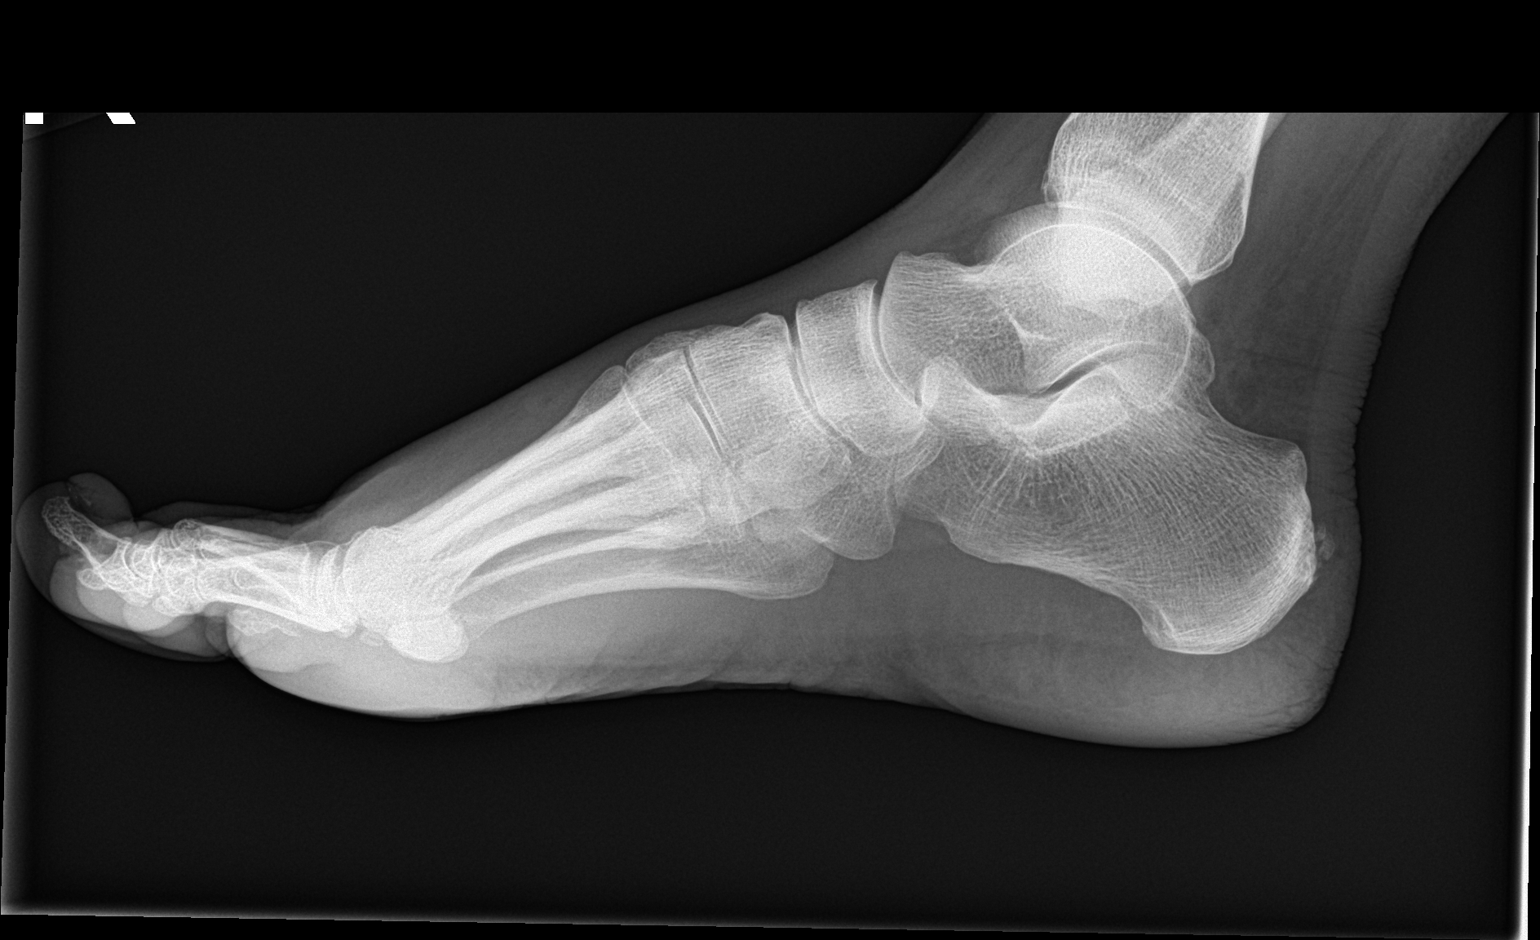

[3 of 3 positions shown; findings below may reference images not displayed]

FINDINGS: No fracture.  No bone lesion.

The joints are normally aligned. Minor first metatarsophalangeal
joint osteoarthritis. No other arthropathic changes.

Soft tissues are unremarkable.
IMPRESSION: No fracture or dislocation.

## 2019-04-26 ENCOUNTER — Other Ambulatory Visit: Payer: Self-pay | Admitting: Family Medicine

## 2019-04-26 MED ORDER — LEVOTHYROXINE SODIUM 50 MCG PO TABS: 50.0000 ug | ORAL_TABLET | Freq: Every day | ORAL | 0 refills | Status: DC

## 2019-04-26 NOTE — Telephone Encounter (Signed)
Copied from [place] [id number]. Topic: Quick Communication - Rx Refill/Question >> Apr 26, 2019  2:57 PM Izola Price, [place] A wrote: Medication: SYNTHROID 50 MCG tablet (Patient stated that pharmacy has tried reaching out to office to get medication signed off on. Patient only has 1 pill left and is going out of town. Patient would like a callback once medication has been called in.)  Has the patient contacted their pharmacy? {yes (Agent: If no, request that the patient contact the pharmacy for the refill.) (Agent: If yes, when and what did the pharmacy advise?)Contact Pcp  Preferred Pharmacy (with phone number or street name): [facility] [place], [address] AT [address] [phone] (Phone) [phone] (Fax)    Agent: Please be advised that RX refills may take up to 3 business days. We ask that you follow-up with your pharmacy.

## 2019-04-26 NOTE — Telephone Encounter (Signed)
Requested medication (s) are due for refill today: yes  Requested medication (s) are on the active medication list: yes  Last refill:  01/22/2019  Future visit scheduled: no  Notes to clinic: review for refill Patient has one pill left and will be going out of town. Please contact once sent    Requested Prescriptions  Pending Prescriptions Disp Refills   levothyroxine (SYNTHROID) 50 MCG tablet 90 tablet 0    Sig: Take 1 tablet (50 mcg total) by mouth daily.     Endocrinology:  Hypothyroid Agents Failed - 04/26/2019  3:06 PM      Failed - TSH needs to be rechecked within 3 months after an abnormal result. Refill until TSH is due.      Passed - TSH in normal range and within 360 days    TSH  Date Value Ref Range Status  08/20/2018 1.41 0.35 - 4.50 uIU/mL Final         Passed - Valid encounter within last 12 months    Recent Outpatient Visits          8 months ago Other abnormal glucose   [facility] [place], MD   11 months ago Breast cancer screening   [facility] [place], MD   1 year ago LUQ pain   [facility] [place], MD   1 year ago Hyperlipidemia, unspecified hyperlipidemia type   [facility] [place], MD   1 year ago Right foot pain   [facility], Ranell Patrick, MD

## 2019-07-20 ENCOUNTER — Other Ambulatory Visit: Payer: Self-pay | Admitting: Family Medicine

## 2019-07-25 ENCOUNTER — Telehealth: Payer: Self-pay

## 2019-07-25 NOTE — Telephone Encounter (Signed)
PA approved. Effective from 07/25/2019 through 07/24/2020.

## 2019-07-25 NOTE — Telephone Encounter (Signed)
PA initiated via Covermymeds; KEY: [id number]. Awaiting determination.

## 2019-07-30 ENCOUNTER — Telehealth: Payer: Self-pay

## 2019-07-30 NOTE — Telephone Encounter (Signed)
Please find patient a spot in the new year  Please advise

## 2019-07-30 NOTE — Telephone Encounter (Signed)
Copied from [place] [phone] [id number]. Topic: General - Inquiry >> Jul 29, 2019  4:15 PM Mathis Bud wrote: Reason for CRM: patient would to call get a CPE done and blood work done at the beginning of the year.  PCP does not have anything.  Wondering if she can get fit in.    Call back  [phone]

## 2019-09-13 DIAGNOSIS — L821 Other seborrheic keratosis: Secondary | ICD-10-CM | POA: Diagnosis not present

## 2019-09-13 DIAGNOSIS — D1801 Hemangioma of skin and subcutaneous tissue: Secondary | ICD-10-CM | POA: Diagnosis not present

## 2019-09-13 DIAGNOSIS — L814 Other melanin hyperpigmentation: Secondary | ICD-10-CM | POA: Diagnosis not present

## 2019-09-13 DIAGNOSIS — L853 Xerosis cutis: Secondary | ICD-10-CM | POA: Diagnosis not present

## 2019-09-30 ENCOUNTER — Encounter: Payer: Self-pay | Admitting: Family Medicine

## 2019-09-30 ENCOUNTER — Ambulatory Visit (INDEPENDENT_AMBULATORY_CARE_PROVIDER_SITE_OTHER): Payer: Medicare Other | Admitting: Family Medicine

## 2019-09-30 ENCOUNTER — Other Ambulatory Visit: Payer: Self-pay

## 2019-09-30 VITALS — BP 135/60 | HR 71 | Temp 97.1°F | Resp 16 | Ht 62.0 in | Wt 142.0 lb

## 2019-09-30 DIAGNOSIS — R7309 Other abnormal glucose: Secondary | ICD-10-CM

## 2019-09-30 DIAGNOSIS — J329 Chronic sinusitis, unspecified: Secondary | ICD-10-CM

## 2019-09-30 DIAGNOSIS — E559 Vitamin D deficiency, unspecified: Secondary | ICD-10-CM | POA: Diagnosis not present

## 2019-09-30 DIAGNOSIS — Z0001 Encounter for general adult medical examination with abnormal findings: Secondary | ICD-10-CM | POA: Diagnosis not present

## 2019-09-30 DIAGNOSIS — Z0000 Encounter for general adult medical examination without abnormal findings: Secondary | ICD-10-CM

## 2019-09-30 DIAGNOSIS — E039 Hypothyroidism, unspecified: Secondary | ICD-10-CM | POA: Diagnosis not present

## 2019-09-30 DIAGNOSIS — N898 Other specified noninflammatory disorders of vagina: Secondary | ICD-10-CM | POA: Diagnosis not present

## 2019-09-30 DIAGNOSIS — E785 Hyperlipidemia, unspecified: Secondary | ICD-10-CM | POA: Diagnosis not present

## 2019-09-30 LAB — COMPREHENSIVE METABOLIC PANEL
ALT: 13 U/L (ref 0–35)
AST: 19 U/L (ref 0–37)
Albumin: 4.1 g/dL (ref 3.5–5.2)
Alkaline Phosphatase: 84 U/L (ref 39–117)
BUN: 26 mg/dL — ABNORMAL HIGH (ref 6–23)
CO2: 28 mEq/L (ref 19–32)
Calcium: 9 mg/dL (ref 8.4–10.5)
Chloride: 104 mEq/L (ref 96–112)
Creatinine, Ser: 0.85 mg/dL (ref 0.40–1.20)
GFR: 67 mL/min (ref >60.00)
Glucose, Bld: 85 mg/dL (ref 70–99)
Potassium: 4.2 mEq/L (ref 3.5–5.1)
Sodium: 141 mEq/L (ref 135–145)
Total Bilirubin: 0.3 mg/dL (ref 0.2–1.2)
Total Protein: 6.7 g/dL (ref 6.0–8.3)

## 2019-09-30 LAB — LIPID PANEL
Cholesterol: 184 mg/dL (ref 0–200)
HDL: 59 mg/dL (ref >39.00)
LDL Cholesterol: 107 mg/dL — ABNORMAL HIGH (ref 0–99)
NonHDL: 124.98
Total CHOL/HDL Ratio: 3
Triglycerides: 92 mg/dL (ref 0.0–149.0)
VLDL: 18.4 mg/dL (ref 0.0–40.0)

## 2019-09-30 LAB — HEMOGLOBIN A1C: Hgb A1c MFr Bld: 5.6 % (ref 4.6–6.5)

## 2019-09-30 LAB — CBC
HCT: 42 % (ref 36.0–46.0)
Hemoglobin: 13.9 g/dL (ref 12.0–15.0)
MCHC: 33.2 g/dL (ref 30.0–36.0)
MCV: 92.2 fl (ref 78.0–100.0)
Platelets: 235 10*3/uL (ref 150.0–400.0)
RBC: 4.55 Mil/uL (ref 3.87–5.11)
RDW: 13.4 % (ref 11.5–15.5)
WBC: 9.4 10*3/uL (ref 4.0–10.5)

## 2019-09-30 LAB — TSH: TSH: 2.16 u[IU]/mL (ref 0.35–4.50)

## 2019-09-30 LAB — VITAMIN D 25 HYDROXY (VIT D DEFICIENCY, FRACTURES): VITD: 23.33 ng/mL — ABNORMAL LOW (ref 30.00–100.00)

## 2019-09-30 MED ORDER — AMOXICILLIN 500 MG PO CAPS: 500.0000 mg | ORAL_CAPSULE | Freq: Three times a day (TID) | ORAL | 0 refills | Status: DC

## 2019-09-30 NOTE — Progress Notes (Signed)
Subjective:    Patient ID: Susan Terrell, female    DOB: [birth year], 64 y.o.   MRN: [id number]  Chief Complaint  Patient presents with  . Annual Exam    HPI Patient is in today for annual preventative exam and follow up on chronic concerns. No recent febrile illness but is noting increased head congestion. No fevers or chills. She has been able to rest more and is taking care of her self better lately. Denies CP/palp/SOB/HA/congestion/fevers/GI or GU c/o. Taking meds as prescribed  Past Medical History:  Diagnosis Date  . Anal fissure   . Asthma    childhood?  . Cervical cancer screening 09/01/2015  . Chicken pox as a child  . Elevated BP 11/10/2013  . Headache 03/01/2015  . Hyperlipidemia 04/13/2017  . Hypothyroidism 09/06/2015  . IBS (irritable bowel syndrome) 2010  . Measles as a child  . Mumps as a child  . Osteopenia 2013  . Pneumonia   . Preventative health care 09/01/2015  . UTI (lower urinary tract infection)   . Vitamin D deficiency 02/20/2015    Past Surgical History:  Procedure Laterality Date  . ABDOMINAL HYSTERECTOMY    . BREAST SURGERY     left needle biopsy  . INCISE AND DRAIN ABCESS     right groin  . PILONIDAL CYST EXCISION      Family History  Problem Relation Age of Onset  . Cancer Father        pancreatic   . Heart attack Brother   . Cancer Mother        colon  . Hyperlipidemia Mother   . Glaucoma Mother   . Thyroid disease Mother   . Hypertension Mother   . Cataracts Sister   . Asthma Daughter   . Hypertension Daughter   . Cataracts Maternal Grandmother   . Alcohol abuse Maternal Grandfather   . Hyperlipidemia Sister   . Glaucoma Sister   . Arthritis Sister        rheumatoid  . Glaucoma Brother   . Hyperlipidemia Brother   . Hypertension Brother   . Other Brother        carotid artery disease  . Colitis Daughter   . Hyperlipidemia Daughter   . Asthma Daughter     Social History   Socioeconomic History  . Marital status: Widowed     Spouse name: Not on file  . Number of children: Not on file  . Years of education: Not on file  . Highest education level: Not on file  Occupational History  . Not on file  Tobacco Use  . Smoking status: Never Smoker  . Smokeless tobacco: Never Used  Substance and Sexual Activity  . Alcohol use: No  . Drug use: No  . Sexual activity: Yes    Birth control/protection: Surgical    Comment: lives with husband and daughter, no dietary restrictions.  Other Topics Concern  . Not on file  Social History Narrative  . Not on file   Social Determinants of Health   Financial Resource Strain:   . Difficulty of Paying Living Expenses: Not on file  Food Insecurity:   . Worried About Programme researcher, broadcasting/film/video in the Last Year: Not on file  . Ran Out of Food in the Last Year: Not on file  Transportation Needs:   . Lack of Transportation (Medical): Not on file  . Lack of Transportation (Non-Medical): Not on file  Physical Activity:   . Days of Exercise  per Week: Not on file  . Minutes of Exercise per Session: Not on file  Stress:   . Feeling of Stress : Not on file  Social Connections:   . Frequency of Communication with Friends and Family: Not on file  . Frequency of Social Gatherings with Friends and Family: Not on file  . Attends Religious Services: Not on file  . Active Member of Clubs or Organizations: Not on file  . Attends Banker Meetings: Not on file  . Marital Status: Not on file  Intimate Partner Violence:   . Fear of Current or Ex-Partner: Not on file  . Emotionally Abused: Not on file  . Physically Abused: Not on file  . Sexually Abused: Not on file    Outpatient Medications Prior to Visit  Medication Sig Dispense Refill  . SYNTHROID 50 MCG tablet TAKE 1 TABLET(50 MCG) BY MOUTH DAILY 90 tablet 0  . Cholecalciferol (VITAMIN D) 2000 units CAPS Take 1 capsule (2,000 Units total) by mouth daily. (Patient not taking: Reported on 09/30/2019) 30 capsule    No  facility-administered medications prior to visit.    Allergies  Allergen Reactions  . Bee Venom     Swelling localized  . Codeine   . Iodine   . Macrodantin     Review of Systems  Constitutional: Negative for fever and malaise/fatigue.  HENT: Positive for congestion and sinus pain.   Eyes: Negative for blurred vision.  Respiratory: Negative for shortness of breath.   Cardiovascular: Negative for chest pain, palpitations and leg swelling.  Gastrointestinal: Negative for abdominal pain, blood in stool and nausea.  Genitourinary: Negative for dysuria and frequency.  Musculoskeletal: Negative for falls.  Skin: Negative for rash.  Neurological: Positive for headaches. Negative for dizziness and loss of consciousness.  Endo/Heme/Allergies: Negative for environmental allergies.  Psychiatric/Behavioral: Negative for depression. The patient is not nervous/anxious.        Objective:    Physical Exam Constitutional:      General: She is not in acute distress.    Appearance: She is well-developed.  HENT:     Head: Normocephalic and atraumatic.  Eyes:     Conjunctiva/sclera: Conjunctivae normal.  Neck:     Thyroid: No thyromegaly.  Cardiovascular:     Rate and Rhythm: Normal rate and regular rhythm.     Heart sounds: Normal heart sounds. No murmur.  Pulmonary:     Effort: Pulmonary effort is normal. No respiratory distress.     Breath sounds: Normal breath sounds.  Abdominal:     General: Bowel sounds are normal. There is no distension.     Palpations: Abdomen is soft. There is no mass.     Tenderness: There is no abdominal tenderness.  Musculoskeletal:     Cervical back: Neck supple.  Lymphadenopathy:     Cervical: No cervical adenopathy.  Skin:    General: Skin is warm and dry.  Neurological:     Mental Status: She is alert and oriented to person, place, and time.  Psychiatric:        Behavior: Behavior normal.     BP 135/60 (BP Location: Left Arm, Patient  Position: Sitting, Cuff Size: Small)   Pulse 71   Temp (!) 97.1 F (36.2 C) (Temporal)   Resp 16   Ht 5\' 2"  (1.575 m)   Wt 142 lb (64.4 kg)   SpO2 100%   BMI 25.97 kg/m  Wt Readings from Last 3 Encounters:  09/30/19 142 lb (64.4 kg)  08/20/18 147 lb 11.2 oz (67 kg)  05/21/18 148 lb 12.8 oz (67.5 kg)    Diabetic Foot Exam - Simple   No data filed     Lab Results  Component Value Date   WBC 9.4 09/30/2019   HGB 13.9 09/30/2019   HCT 42.0 09/30/2019   PLT 235.0 09/30/2019   GLUCOSE 85 09/30/2019   CHOL 184 09/30/2019   TRIG 92.0 09/30/2019   HDL 59.00 09/30/2019   LDLCALC 107 (H) 09/30/2019   ALT 13 09/30/2019   AST 19 09/30/2019   NA 141 09/30/2019   K 4.2 09/30/2019   CL 104 09/30/2019   CREATININE 0.85 09/30/2019   BUN 26 (H) 09/30/2019   CO2 28 09/30/2019   TSH 2.16 09/30/2019   HGBA1C 5.6 09/30/2019    Lab Results  Component Value Date   TSH 2.16 09/30/2019   Lab Results  Component Value Date   WBC 9.4 09/30/2019   HGB 13.9 09/30/2019   HCT 42.0 09/30/2019   MCV 92.2 09/30/2019   PLT 235.0 09/30/2019   Lab Results  Component Value Date   NA 141 09/30/2019   K 4.2 09/30/2019   CO2 28 09/30/2019   GLUCOSE 85 09/30/2019   BUN 26 (H) 09/30/2019   CREATININE 0.85 09/30/2019   BILITOT 0.3 09/30/2019   ALKPHOS 84 09/30/2019   AST 19 09/30/2019   ALT 13 09/30/2019   PROT 6.7 09/30/2019   ALBUMIN 4.1 09/30/2019   CALCIUM 9.0 09/30/2019   GFR 67.00 09/30/2019   Lab Results  Component Value Date   CHOL 184 09/30/2019   Lab Results  Component Value Date   HDL 59.00 09/30/2019   Lab Results  Component Value Date   LDLCALC 107 (H) 09/30/2019   Lab Results  Component Value Date   TRIG 92.0 09/30/2019   Lab Results  Component Value Date   CHOLHDL 3 09/30/2019   Lab Results  Component Value Date   HGBA1C 5.6 09/30/2019       Assessment & Plan:   Problem List Items Addressed This Visit    Other abnormal glucose    hgba1c  acceptable, minimize simple carbs. Increase exercise as tolerated.      Relevant Orders   Hemoglobin A1c (Completed)   Vitamin D deficiency    Supplement and monitor      Relevant Orders   Comprehensive metabolic panel (Completed)   VITAMIN D 25 Hydroxy (Vit-D Deficiency, Fractures) (Completed)   Preventative health care    Patient encouraged to maintain heart healthy diet, regular exercise, adequate sleep. Consider daily probiotics. Take medications as prescribed. MGM up to date, June 2020.      Relevant Orders   CBC (Completed)   Hypothyroidism    On Levothyroxine, continue to monitor      Relevant Orders   TSH (Completed)   Hyperlipidemia    Encouraged heart healthy diet, increase exercise, avoid trans fats, consider a krill oil cap daily      Relevant Orders   Lipid panel (Completed)   Vaginal discharge - Primary    She describes it as red at times and mild odor. Has been returned back to her GYN office for evaluation      Relevant Orders   Ambulatory referral to Obstetrics / Gynecology   Sinusitis    Encouraged increased rest and hydration, add probiotics, zinc such as Coldeze or Xicam. Treat fevers as needed.  Started on Amoxicillin, probiotics and Mucinex      Relevant  Medications   amoxicillin (AMOXIL) 500 MG capsule      I am having Artia T. Twiford start on amoxicillin. I am also having her maintain her Vitamin D and Synthroid.  Meds ordered this encounter  Medications  . amoxicillin (AMOXIL) 500 MG capsule    Sig: Take 1 capsule (500 mg total) by mouth 3 (three) times daily.    Dispense:  30 capsule    Refill:  0     Danise Edge, MD

## 2019-09-30 NOTE — Assessment & Plan Note (Signed)
She describes it as red at times and mild odor. Has been returned back to her GYN office for evaluation

## 2019-09-30 NOTE — Assessment & Plan Note (Signed)
Encouraged increased rest and hydration, add probiotics, zinc such as Coldeze or Xicam. Treat fevers as needed.  Started on Amoxicillin, probiotics and Mucinex

## 2019-09-30 NOTE — Patient Instructions (Signed)
[employer]ron Blood Pressure cuff, upper arm, want BP 100-140/60-90 Pulse oximeter, want oxygen in 90s  Weekly vitals  Take Multivitamin with minerals, selenium Vitamin D 1000-2000 IU daily Probiotic with lactobacillus and bifidophilus Asprin EC 81 mg daily  Melatonin 2-5 mg at bedtime  [email or web address] Preventive Care 66 Years and Older, Female Preventive care refers to lifestyle choices and visits with your health care provider that can promote health and wellness. This includes:  A yearly physical exam. This is also called an annual well check.  Regular dental and eye exams.  Immunizations.  Screening for certain conditions.  Healthy lifestyle choices, such as diet and exercise. What can I expect for my preventive care visit? Physical exam Your health care provider will check:  Height and weight. These may be used to calculate body mass index (BMI), which is a measurement that tells if you are at a healthy weight.  Heart rate and blood pressure.  Your skin for abnormal spots. Counseling Your health care provider may ask you questions about:  Alcohol, tobacco, and drug use.  Emotional well-being.  Home and relationship well-being.  Sexual activity.  Eating habits.  History of falls.  Memory and ability to understand (cognition).  Work and work Statistician.  Pregnancy and menstrual history. What immunizations do I need?  Influenza (flu) vaccine  This is recommended every year. Tetanus, diphtheria, and pertussis (Tdap) vaccine  You may need a Td booster every 10 years. Varicella (chickenpox) vaccine  You may need this vaccine if you have not already been vaccinated. Zoster (shingles) vaccine  You may need this after age 66. Pneumococcal conjugate (PCV13) vaccine  One dose is recommended after age 66. Pneumococcal polysaccharide (PPSV23) vaccine  One dose is recommended after age 66. Measles, mumps, and rubella  (MMR) vaccine  You may need at least one dose of MMR if you were born in 1957 or later. You may also need a second dose. Meningococcal conjugate (MenACWY) vaccine  You may need this if you have certain conditions. Hepatitis A vaccine  You may need this if you have certain conditions or if you travel or work in places where you may be exposed to hepatitis A. Hepatitis B vaccine  You may need this if you have certain conditions or if you travel or work in places where you may be exposed to hepatitis B. Haemophilus influenzae type b (Hib) vaccine  You may need this if you have certain conditions. You may receive vaccines as individual doses or as more than one vaccine together in one shot (combination vaccines). Talk with your health care provider about the risks and benefits of combination vaccines. What tests do I need? Blood tests  Lipid and cholesterol levels. These may be checked every 5 years, or more frequently depending on your overall health.  Hepatitis C test.  Hepatitis B test. Screening  Lung cancer screening. You may have this screening every year starting at age 66 if you have a 30-pack-year history of smoking and currently smoke or have quit within the past 15 years.  Colorectal cancer screening. All adults should have this screening starting at age 66 and continuing until age 72. Your health care provider may recommend screening at age 66 if you are at increased risk. You will have tests every 1-10 years, depending on your results and the type of screening test.  Diabetes screening. This is done by checking your blood sugar (glucose) after you have not eaten for a while (fasting). You may have this  done every 1-3 years.  Mammogram. This may be done every 1-2 years. Talk with your health care provider about how often you should have regular mammograms.  BRCA-related cancer screening. This may be done if you have a family history of breast, ovarian, tubal, or peritoneal  cancers. Other tests  Sexually transmitted disease (STD) testing.  Bone density scan. This is done to screen for osteoporosis. You may have this done starting at age 66. Follow these instructions at home: Eating and drinking  Eat a diet that includes fresh fruits and vegetables, whole grains, lean protein, and low-fat dairy products. Limit your intake of foods with high amounts of sugar, saturated fats, and salt.  Take vitamin and mineral supplements as recommended by your health care provider.  Do not drink alcohol if your health care provider tells you not to drink.  If you drink alcohol: ? Limit how much you have to 0-1 drink a day. ? Be aware of how much alcohol is in your drink. In the [place] one drink equals one 12 oz bottle of beer (355 mL), one 5 oz glass of wine (148 mL), or one 1 oz glass of hard liquor (44 mL). Lifestyle  Take daily care of your teeth and gums.  Stay active. Exercise for at least 30 minutes on 5 or more days each week.  Do not use any products that contain nicotine or tobacco, such as cigarettes, e-cigarettes, and chewing tobacco. If you need help quitting, ask your health care provider.  If you are sexually active, practice safe sex. Use a condom or other form of protection in order to prevent STIs (sexually transmitted infections).  Talk with your health care provider about taking a low-dose aspirin or statin. What's next?  Go to your health care provider once a year for a well check visit.  Ask your health care provider how often you should have your eyes and teeth checked.  Stay up to date on all vaccines. This information is not intended to replace advice given to you by your health care provider. Make sure you discuss any questions you have with your health care provider. Document Revised: 07/26/2018 Document Reviewed: 07/26/2018 Elsevier Patient Education  2020 [place]

## 2019-09-30 NOTE — Assessment & Plan Note (Signed)
hgba1c acceptable, minimize simple carbs. Increase exercise as tolerated.  

## 2019-09-30 NOTE — Assessment & Plan Note (Signed)
On Levothyroxine, continue to monitor 

## 2019-09-30 NOTE — Assessment & Plan Note (Signed)
Patient encouraged to maintain heart healthy diet, regular exercise, adequate sleep. Consider daily probiotics. Take medications as prescribed. MGM up to date, June 2020.

## 2019-09-30 NOTE — Assessment & Plan Note (Signed)
Encouraged heart healthy diet, increase exercise, avoid trans fats, consider a krill oil cap daily 

## 2019-09-30 NOTE — Assessment & Plan Note (Signed)
Supplement and monitor 

## 2019-10-02 ENCOUNTER — Telehealth: Payer: Self-pay

## 2019-10-02 MED ORDER — VITAMIN D (ERGOCALCIFEROL) 1.25 MG (50000 UNIT) PO CAPS: 50000.0000 [IU] | ORAL_CAPSULE | ORAL | 4 refills | Status: AC

## 2019-10-02 NOTE — Telephone Encounter (Signed)
Pt aware of lab results/sent in Rx for Vit-Lenix Kidd and aware to take OTC total of 2k IU qd/thx dmf

## 2019-10-02 NOTE — Telephone Encounter (Signed)
-----   Message from Bradd Canary, MD sent at 09/30/2019  4:25 PM EST ----- Notify labs look good, no new concerns but vitamin Diona Peregoy is lower. Labs reveal deficiency. Start on Vitamin Dessiree Sze 60737 IU caps, 1 cap po weekly x 12 weeks. Disp #4 with 4 rf. Also take daily Vitamin Mulan Adan over the counter. If already taking a daily supplement increase by 1000 IU daily and if not start Vitamin Javad Salva 2000 IU daily.  Cholesterol is improved some. Encouraged heart healthy diet, increase exercise, avoid trans fats, consider a krill oil cap daily

## 2019-10-20 ENCOUNTER — Other Ambulatory Visit: Payer: Self-pay | Admitting: Family Medicine

## 2019-11-01 ENCOUNTER — Ambulatory Visit: Payer: Medicare Other | Attending: Internal Medicine

## 2019-11-01 DIAGNOSIS — Z23 Encounter for immunization: Secondary | ICD-10-CM

## 2019-11-01 NOTE — Progress Notes (Signed)
   Covid-19 Vaccination Clinic  Name:  Susan Terrell    MRN: [id number] DOB: 10-04-1955  11/01/2019  Susan Terrell was observed post Covid-19 immunization for 30 minutes based on pre-vaccination screening without incident. She was provided with Vaccine Information Sheet and instruction to access the V-Safe system.   Susan Terrell was instructed to call 911 with any severe reactions post vaccine: Marland Kitchen Difficulty breathing  . Swelling of face and throat  . A fast heartbeat  . A bad rash all over body  . Dizziness and weakness   Immunizations Administered    Name Date Dose VIS Date Route   Pfizer COVID-19 Vaccine 11/01/2019  4:32 PM 0.3 mL 07/26/2019 Intramuscular   Manufacturer: [employer], [employer]   Lot: [id number]   NDC: [address]-3

## 2019-11-27 ENCOUNTER — Ambulatory Visit: Payer: Medicare Other | Attending: Internal Medicine

## 2019-11-27 DIAGNOSIS — Z23 Encounter for immunization: Secondary | ICD-10-CM

## 2019-11-27 NOTE — Progress Notes (Signed)
   Covid-19 Vaccination Clinic  Name:  Susan Terrell    MRN: [id number] DOB: July 28, 1956  11/27/2019  [name] was observed post Covid-19 immunization for 30 minutes based on pre-vaccination screening without incident. She was provided with Vaccine Information Sheet and instruction to access the V-Safe system.   [name] was instructed to call 911 with any severe reactions post vaccine: Marland Kitchen Difficulty breathing  . Swelling of face and throat  . A fast heartbeat  . A bad rash all over body  . Dizziness and weakness   Immunizations Administered    Name Date Dose VIS Date Route   Pfizer COVID-19 Vaccine 11/27/2019  4:14 PM 0.3 mL 07/26/2019 Intramuscular   Manufacturer: [employer], [employer]   Lot: [id number]   NDC: [address]-7

## 2020-01-23 ENCOUNTER — Other Ambulatory Visit: Payer: Self-pay | Admitting: Family Medicine

## 2020-02-18 ENCOUNTER — Telehealth: Payer: Self-pay | Admitting: Family Medicine

## 2020-02-18 NOTE — Telephone Encounter (Signed)
This should probably be seen

## 2020-02-18 NOTE — Telephone Encounter (Signed)
Would you like for Susan Terrell to get her set up as a virtual appt or would you rather her see someone in person?

## 2020-02-18 NOTE — Telephone Encounter (Signed)
Patient states there is a knot on her neck, patient states she noticed the knot the other day when put her hair up in a pony tail.  Pt states has applied sunscreen when out in the Sun.  Please advise

## 2020-02-19 NOTE — Telephone Encounter (Signed)
Patient schedule for appointment

## 2020-02-28 ENCOUNTER — Other Ambulatory Visit: Payer: Self-pay

## 2020-02-28 ENCOUNTER — Ambulatory Visit (HOSPITAL_BASED_OUTPATIENT_CLINIC_OR_DEPARTMENT_OTHER)
Admission: RE | Admit: 2020-02-28 | Discharge: 2020-02-28 | Disposition: A | Discharge status: Home / Self Care | Payer: Medicare Other | Source: Ambulatory Visit | Attending: Medical | Admitting: Medical

## 2020-02-28 ENCOUNTER — Encounter: Payer: Self-pay | Admitting: Medical

## 2020-02-28 ENCOUNTER — Ambulatory Visit (INDEPENDENT_AMBULATORY_CARE_PROVIDER_SITE_OTHER): Payer: Medicare Other | Admitting: Medical

## 2020-02-28 ENCOUNTER — Telehealth: Payer: Self-pay | Admitting: Medical

## 2020-02-28 VITALS — BP 130/74 | HR 56 | Temp 98.2°F | Resp 18 | Ht 62.0 in | Wt 147.0 lb

## 2020-02-28 DIAGNOSIS — Q740 Other congenital malformations of upper limb(s), including shoulder girdle: Secondary | ICD-10-CM

## 2020-02-28 DIAGNOSIS — M25511 Pain in right shoulder: Secondary | ICD-10-CM | POA: Diagnosis not present

## 2020-02-28 NOTE — Progress Notes (Signed)
Subjective:    Patient ID: Susan Terrell, female    DOB: 02/16/1956, 64 y.o.   MRN: [id number]  HPI  Pt in for rt side neck region bump.found accidentally when putting moisturizer on.. She points to rt side clavicle proximal and sternoclavicular joint.  No fever, no chills or sweats. Recent left wisdom tooth extract left bottom side. No pain and healing well.    Review of Systems  Constitutional: Negative for chills, diaphoresis and fever.  Respiratory: Negative for cough, chest tightness, shortness of breath and wheezing.   Cardiovascular: Negative for chest pain and palpitations.  Gastrointestinal: Negative for abdominal pain, diarrhea, nausea and vomiting.  Musculoskeletal: Negative for back pain, myalgias and neck pain.  Skin: Negative for pallor.  Hematological: Negative for adenopathy. Does not bruise/bleed easily.  Psychiatric/Behavioral: Negative for behavioral problems, confusion, decreased concentration and dysphoric mood.    Past Medical History:  Diagnosis Date  . Anal fissure   . Asthma    childhood?  . Cervical cancer screening 09/01/2015  . Chicken pox as a child  . Elevated BP 11/10/2013  . Headache 03/01/2015  . Hyperlipidemia 04/13/2017  . Hypothyroidism 09/06/2015  . IBS (irritable bowel syndrome) 2010  . Measles as a child  . Mumps as a child  . Osteopenia 2013  . Pneumonia   . Preventative health care 09/01/2015  . UTI (lower urinary tract infection)   . Vitamin D deficiency 02/20/2015     Social History   Socioeconomic History  . Marital status: Widowed    Spouse name: Not on file  . Number of children: Not on file  . Years of education: Not on file  . Highest education level: Not on file  Occupational History  . Not on file  Tobacco Use  . Smoking status: Never Smoker  . Smokeless tobacco: Never Used  Substance and Sexual Activity  . Alcohol use: No  . Drug use: No  . Sexual activity: Yes    Birth control/protection: Surgical    Comment:  lives with husband and daughter, no dietary restrictions.  Other Topics Concern  . Not on file  Social History Narrative  . Not on file   Social Determinants of Health   Financial Resource Strain:   . Difficulty of Paying Living Expenses:   Food Insecurity:   . Worried About Programme researcher, broadcasting/film/video in the Last Year:   . Barista in the Last Year:   Transportation Needs:   . Freight forwarder (Medical):   Marland Kitchen Lack of Transportation (Non-Medical):   Physical Activity:   . Days of Exercise per Week:   . Minutes of Exercise per Session:   Stress:   . Feeling of Stress :   Social Connections:   . Frequency of Communication with Friends and Family:   . Frequency of Social Gatherings with Friends and Family:   . Attends Religious Services:   . Active Member of Clubs or Organizations:   . Attends Banker Meetings:   Marland Kitchen Marital Status:   Intimate Partner Violence:   . Fear of Current or Ex-Partner:   . Emotionally Abused:   Marland Kitchen Physically Abused:   . Sexually Abused:     Past Surgical History:  Procedure Laterality Date  . ABDOMINAL HYSTERECTOMY    . BREAST SURGERY     left needle biopsy  . INCISE AND DRAIN ABCESS     right groin  . PILONIDAL CYST EXCISION  Family History  Problem Relation Age of Onset  . Cancer Father        pancreatic   . Heart attack Brother   . Cancer Mother        colon  . Hyperlipidemia Mother   . Glaucoma Mother   . Thyroid disease Mother   . Hypertension Mother   . Cataracts Sister   . Asthma Daughter   . Hypertension Daughter   . Cataracts Maternal Grandmother   . Alcohol abuse Maternal Grandfather   . Hyperlipidemia Sister   . Glaucoma Sister   . Arthritis Sister        rheumatoid  . Glaucoma Brother   . Hyperlipidemia Brother   . Hypertension Brother   . Other Brother        carotid artery disease  . Colitis Daughter   . Hyperlipidemia Daughter   . Asthma Daughter     Allergies  Allergen Reactions  .  Bee Venom     Swelling localized  . Codeine   . Iodine   . Macrodantin     Current Outpatient Medications on File Prior to Visit  Medication Sig Dispense Refill  . SYNTHROID 50 MCG tablet TAKE 1 TABLET(50 MCG) BY MOUTH DAILY 90 tablet 3  . amoxicillin (AMOXIL) 500 MG capsule Take 1 capsule (500 mg total) by mouth 3 (three) times daily. (Patient not taking: Reported on 02/28/2020) 30 capsule 0  . Cholecalciferol (VITAMIN D) 2000 units CAPS Take 1 capsule (2,000 Units total) by mouth daily. (Patient not taking: Reported on 09/30/2019) 30 capsule    No current facility-administered medications on file prior to visit.    BP 130/74 (BP Location: Left Arm, Patient Position: Sitting, Cuff Size: Large)   Pulse (!) 56   Temp 98.2 F (36.8 C) (Oral)   Resp 18   Ht 5\' 2"  (1.575 m)   Wt 147 lb (66.7 kg)   SpO2 98%   BMI 26.89 kg/m       Objective:   Physical Exam  General- No acute distress. Pleasant patient. Neck- Full range of motion, no jvd Lungs- Clear, even and unlabored. Heart- regular rate and rhythm. Neurologic- CNII- XII grossly intact. heent- no sinus pressure. Ear canals clear and tm normal. Mouth. Left lower side wisdome tooth extracted area normal.  Abdomen- soft, nontender, non-distended + bs,  Back- no cva tenderness.      Assessment & Plan:  You appear to have asymmetry of rt clavicle compared to left side. Appears normal variant asymetry but will see what radiologist think(see if further work up recommended other than xray). Update you by my chart on results. If area changes or worsens let know.  Follow up date to be determined after xray review.  [place], PA-C   Time spent with patient today was  20 minutes which consisted of chart review, discussing diagnosis, work up treatment and documentation.

## 2020-02-28 NOTE — Telephone Encounter (Signed)
Refer to sports med 

## 2020-02-28 NOTE — Patient Instructions (Signed)
You appear to have asymmetry of rt clavicle compared to left side. Appears normal variant asymetry but will see what radiologist think(see if further work up recommended other than xray). Update you by my chart on results. If area changes or worsens let us know.  Follow up date to be determined after xray review.

## 2020-03-19 ENCOUNTER — Encounter: Payer: Self-pay | Admitting: Family Medicine

## 2020-03-19 ENCOUNTER — Ambulatory Visit: Payer: Medicare Other | Admitting: Family Medicine

## 2020-03-19 ENCOUNTER — Other Ambulatory Visit: Payer: Self-pay

## 2020-03-19 ENCOUNTER — Ambulatory Visit: Payer: Self-pay

## 2020-03-19 VITALS — BP 136/81 | HR 71 | Ht 62.0 in | Wt 144.0 lb

## 2020-03-19 DIAGNOSIS — M659 Synovitis and tenosynovitis, unspecified: Secondary | ICD-10-CM | POA: Diagnosis not present

## 2020-03-19 DIAGNOSIS — M25511 Pain in right shoulder: Secondary | ICD-10-CM

## 2020-03-19 NOTE — Patient Instructions (Signed)
Nice to meet you  Please try ice  Please try the ibuprofen  Please send me a message in MyChart with any questions or updates.  Please see me back in 2 weeks.   --Dr. Jordan Likes

## 2020-03-19 NOTE — Progress Notes (Signed)
Susan Terrell - 64 y.o. female MRN [id number]  Date of birth: Dec 04, 1955  SUBJECTIVE:  Including CC & ROS.  Chief Complaint  Patient presents with  . Shoulder Pain    right clavicle    Susan Terrell is a 64 y.o. female that is presenting with enlargement of the right sternoclavicular joint.  She denies any pain with it.  Denies swelling or redness of other joints. Has not tried any medications.    Review of Systems See HPI   HISTORY: Past Medical, Surgical, Social, and Family History Reviewed & Updated per EMR.   Pertinent Historical Findings include:  Past Medical History:  Diagnosis Date  . Anal fissure   . Asthma    childhood?  . Cervical cancer screening 09/01/2015  . Chicken pox as a child  . Elevated BP 11/10/2013  . Headache 03/01/2015  . Hyperlipidemia 04/13/2017  . Hypothyroidism 09/06/2015  . IBS (irritable bowel syndrome) 2010  . Measles as a child  . Mumps as a child  . Osteopenia 2013  . Pneumonia   . Preventative health care 09/01/2015  . UTI (lower urinary tract infection)   . Vitamin D deficiency 02/20/2015    Past Surgical History:  Procedure Laterality Date  . ABDOMINAL HYSTERECTOMY    . BREAST SURGERY     left needle biopsy  . INCISE AND DRAIN ABCESS     right groin  . PILONIDAL CYST EXCISION      Family History  Problem Relation Age of Onset  . Cancer Father        pancreatic   . Heart attack Brother   . Cancer Mother        colon  . Hyperlipidemia Mother   . Glaucoma Mother   . Thyroid disease Mother   . Hypertension Mother   . Cataracts Sister   . Asthma Daughter   . Hypertension Daughter   . Cataracts Maternal Grandmother   . Alcohol abuse Maternal Grandfather   . Hyperlipidemia Sister   . Glaucoma Sister   . Arthritis Sister        rheumatoid  . Glaucoma Brother   . Hyperlipidemia Brother   . Hypertension Brother   . Other Brother        carotid artery disease  . Colitis Daughter   . Hyperlipidemia Daughter   . Asthma Daughter      Social History   Socioeconomic History  . Marital status: Widowed    Spouse name: Not on file  . Number of children: Not on file  . Years of education: Not on file  . Highest education level: Not on file  Occupational History  . Not on file  Tobacco Use  . Smoking status: Never Smoker  . Smokeless tobacco: Never Used  Substance and Sexual Activity  . Alcohol use: No  . Drug use: No  . Sexual activity: Yes    Birth control/protection: Surgical    Comment: lives with husband and daughter, no dietary restrictions.  Other Topics Concern  . Not on file  Social History Narrative  . Not on file   Social Determinants of Health   Financial Resource Strain:   . Difficulty of Paying Living Expenses:   Food Insecurity:   . Worried About Programme researcher, broadcasting/film/video in the Last Year:   . Barista in the Last Year:   Transportation Needs:   . Freight forwarder (Medical):   Marland Kitchen Lack of Transportation (Non-Medical):   Physical  Activity:   . Days of Exercise per Week:   . Minutes of Exercise per Session:   Stress:   . Feeling of Stress :   Social Connections:   . Frequency of Communication with Friends and Family:   . Frequency of Social Gatherings with Friends and Family:   . Attends Religious Services:   . Active Member of Clubs or Organizations:   . Attends Banker Meetings:   Marland Kitchen Marital Status:   Intimate Partner Violence:   . Fear of Current or Ex-Partner:   . Emotionally Abused:   Marland Kitchen Physically Abused:   . Sexually Abused:      PHYSICAL EXAM:  VS: BP 136/81   Pulse 71   Ht 5\' 2"  (1.575 m)   Wt 144 lb (65.3 kg)   BMI 26.34 kg/m  Physical Exam Gen: NAD, alert, cooperative with exam, well-appearing MSK:  Chest:  No redness or warmth. Obvious swelling of the right sternoclavicular joint. Normal shoulder range of motion. Neurovascularly intact  Limited ultrasound: Right clavicle:  No changes of the shaft of the clavicle. Synovitis appreciated  of the sternoclavicular joint on the right. Increased hyperemia associated with this.  Summary: Synovitis of the sternoclavicular joint.  Ultrasound and interpretation by , MD    ASSESSMENT & PLAN:   Synovitis Changes appreciated on ultrasound.  No other joints affected.  Less likely for pigmented villonodular synovitis. -Counseled on taking ibuprofen. -If no improvement will consider lab work and MRI.

## 2020-03-20 DIAGNOSIS — M659 Synovitis and tenosynovitis, unspecified: Secondary | ICD-10-CM | POA: Insufficient documentation

## 2020-03-20 NOTE — Assessment & Plan Note (Signed)
Changes appreciated on ultrasound.  No other joints affected.  Less likely for pigmented villonodular synovitis. -Counseled on taking ibuprofen. -If no improvement will consider lab work and MRI.

## 2020-04-02 ENCOUNTER — Ambulatory Visit: Payer: Medicare Other | Admitting: Family Medicine

## 2020-07-02 ENCOUNTER — Telehealth: Payer: Medicare Other | Admitting: Family Medicine

## 2020-07-27 ENCOUNTER — Telehealth: Payer: Self-pay | Admitting: Family Medicine

## 2020-07-27 ENCOUNTER — Encounter: Payer: Medicare Other | Admitting: Family Medicine

## 2020-07-27 NOTE — Telephone Encounter (Signed)
Patient had appt for 12.13.2021(blyth not her so appt was canceled)  Patient states she believes levels needed to be checked when taking SYNTHRIOD  Patient could not get CPE appt until June   Can we order labs before hand for her?  Please advise     Number for patient:  [phone] or [phone]

## 2020-07-28 ENCOUNTER — Ambulatory Visit: Payer: Medicare Other | Admitting: Family Medicine

## 2020-08-11 NOTE — Telephone Encounter (Signed)
Please advise 

## 2020-08-12 ENCOUNTER — Other Ambulatory Visit: Payer: Self-pay | Admitting: Family Medicine

## 2020-08-12 DIAGNOSIS — E785 Hyperlipidemia, unspecified: Secondary | ICD-10-CM

## 2020-08-12 DIAGNOSIS — E559 Vitamin D deficiency, unspecified: Secondary | ICD-10-CM

## 2020-08-12 DIAGNOSIS — E039 Hypothyroidism, unspecified: Secondary | ICD-10-CM

## 2020-08-12 DIAGNOSIS — R7309 Other abnormal glucose: Secondary | ICD-10-CM

## 2020-08-12 DIAGNOSIS — I10 Essential (primary) hypertension: Secondary | ICD-10-CM

## 2020-08-12 NOTE — Telephone Encounter (Signed)
I agree with her she needs lab work, have ordered so please arrange an appt. She also needs to be sen prior to June. She was last seen in January last year so an appt sooner would be good.

## 2020-08-13 NOTE — Telephone Encounter (Signed)
Found earlier appointment in February and lab appointment scheduled as well.

## 2020-10-01 ENCOUNTER — Other Ambulatory Visit (INDEPENDENT_AMBULATORY_CARE_PROVIDER_SITE_OTHER): Payer: Medicare Other

## 2020-10-01 ENCOUNTER — Other Ambulatory Visit: Payer: Self-pay

## 2020-10-01 DIAGNOSIS — I10 Essential (primary) hypertension: Secondary | ICD-10-CM | POA: Diagnosis not present

## 2020-10-01 DIAGNOSIS — E785 Hyperlipidemia, unspecified: Secondary | ICD-10-CM | POA: Diagnosis not present

## 2020-10-01 DIAGNOSIS — R7309 Other abnormal glucose: Secondary | ICD-10-CM | POA: Diagnosis not present

## 2020-10-01 DIAGNOSIS — E559 Vitamin D deficiency, unspecified: Secondary | ICD-10-CM

## 2020-10-01 LAB — COMPREHENSIVE METABOLIC PANEL
ALT: 13 U/L (ref 0–35)
AST: 18 U/L (ref 0–37)
Albumin: 3.8 g/dL (ref 3.5–5.2)
Alkaline Phosphatase: 77 U/L (ref 39–117)
BUN: 15 mg/dL (ref 6–23)
CO2: 30 mEq/L (ref 19–32)
Calcium: 9 mg/dL (ref 8.4–10.5)
Chloride: 105 mEq/L (ref 96–112)
Creatinine, Ser: 0.73 mg/dL (ref 0.40–1.20)
GFR: 85.6 mL/min (ref >60.00)
Glucose, Bld: 81 mg/dL (ref 70–99)
Potassium: 3.8 mEq/L (ref 3.5–5.1)
Sodium: 139 mEq/L (ref 135–145)
Total Bilirubin: 0.5 mg/dL (ref 0.2–1.2)
Total Protein: 6.6 g/dL (ref 6.0–8.3)

## 2020-10-01 LAB — LIPID PANEL
Cholesterol: 175 mg/dL (ref 0–200)
HDL: 51.4 mg/dL (ref >39.00)
LDL Cholesterol: 106 mg/dL — ABNORMAL HIGH (ref 0–99)
NonHDL: 123.24
Total CHOL/HDL Ratio: 3
Triglycerides: 85 mg/dL (ref 0.0–149.0)
VLDL: 17 mg/dL (ref 0.0–40.0)

## 2020-10-01 LAB — CBC
HCT: 42.3 % (ref 36.0–46.0)
Hemoglobin: 13.9 g/dL (ref 12.0–15.0)
MCHC: 33 g/dL (ref 30.0–36.0)
MCV: 89.7 fl (ref 78.0–100.0)
Platelets: 229 10*3/uL (ref 150.0–400.0)
RBC: 4.71 Mil/uL (ref 3.87–5.11)
RDW: 14.4 % (ref 11.5–15.5)
WBC: 5.7 10*3/uL (ref 4.0–10.5)

## 2020-10-01 LAB — VITAMIN D 25 HYDROXY (VIT D DEFICIENCY, FRACTURES): VITD: 21.04 ng/mL — ABNORMAL LOW (ref 30.00–100.00)

## 2020-10-01 LAB — HEMOGLOBIN A1C: Hgb A1c MFr Bld: 5.7 % (ref 4.6–6.5)

## 2020-10-01 LAB — TSH: TSH: 4.13 u[IU]/mL (ref 0.35–4.50)

## 2020-10-02 DIAGNOSIS — Z780 Asymptomatic menopausal state: Secondary | ICD-10-CM | POA: Diagnosis not present

## 2020-10-02 DIAGNOSIS — M8589 Other specified disorders of bone density and structure, multiple sites: Secondary | ICD-10-CM | POA: Diagnosis not present

## 2020-10-02 DIAGNOSIS — Z1231 Encounter for screening mammogram for malignant neoplasm of breast: Secondary | ICD-10-CM | POA: Diagnosis not present

## 2020-10-02 LAB — HM MAMMOGRAPHY

## 2020-10-02 LAB — HM DEXA SCAN

## 2020-10-06 ENCOUNTER — Encounter: Payer: Self-pay | Admitting: *Deleted

## 2020-10-07 ENCOUNTER — Other Ambulatory Visit: Payer: Self-pay

## 2020-10-08 ENCOUNTER — Ambulatory Visit (INDEPENDENT_AMBULATORY_CARE_PROVIDER_SITE_OTHER): Payer: Medicare Other | Admitting: Family Medicine

## 2020-10-08 ENCOUNTER — Encounter: Payer: Self-pay | Admitting: Family Medicine

## 2020-10-08 ENCOUNTER — Other Ambulatory Visit: Payer: Self-pay

## 2020-10-08 VITALS — BP 122/70 | HR 56 | Temp 97.9°F | Resp 16 | Ht 62.0 in | Wt 149.2 lb

## 2020-10-08 DIAGNOSIS — R3915 Urgency of urination: Secondary | ICD-10-CM | POA: Diagnosis not present

## 2020-10-08 DIAGNOSIS — R7309 Other abnormal glucose: Secondary | ICD-10-CM

## 2020-10-08 DIAGNOSIS — E559 Vitamin D deficiency, unspecified: Secondary | ICD-10-CM | POA: Diagnosis not present

## 2020-10-08 DIAGNOSIS — Z0000 Encounter for general adult medical examination without abnormal findings: Secondary | ICD-10-CM | POA: Diagnosis not present

## 2020-10-08 DIAGNOSIS — E785 Hyperlipidemia, unspecified: Secondary | ICD-10-CM

## 2020-10-08 DIAGNOSIS — E039 Hypothyroidism, unspecified: Secondary | ICD-10-CM

## 2020-10-08 MED ORDER — VITAMIN D (ERGOCALCIFEROL) 1.25 MG (50000 UNIT) PO CAPS: 50000.0000 [IU] | ORAL_CAPSULE | ORAL | 4 refills | Status: DC

## 2020-10-08 NOTE — Assessment & Plan Note (Signed)
hgba1c acceptable, minimize simple carbs. Increase exercise as tolerated.  

## 2020-10-08 NOTE — Assessment & Plan Note (Signed)
Supplement and monitor 

## 2020-10-08 NOTE — Patient Instructions (Addendum)
[facility], Dr Silverio Decamp  No Prevnar (PCV13) Do take Pneumovax (PCV 23) Shingrix is the new shingles shot, 2 shots over 2-6 months at the pharmacy   Recommend calcium intake of 1200 to 1500 mg daily, divided into roughly 3 doses. Best source is the diet and a single dairy serving is about 500 mg, a supplement of calcium citrate once or twice daily to balance diet is fine if not getting enough in diet. Also need Vitamin D 2000 IU caps, 1 cap daily if not already taking vitamin D. Also recommend weight baring exercise on hips and upper body to keep bones strong   Preventive Care 65 Years and Older, Female Preventive care refers to lifestyle choices and visits with your health care provider that can promote health and wellness. This includes:  A yearly physical exam. This is also called an annual wellness visit.  Regular dental and eye exams.  Immunizations.  Screening for certain conditions.  Healthy lifestyle choices, such as: ? Eating a healthy diet. ? Getting regular exercise. ? Not using drugs or products that contain nicotine and tobacco. ? Limiting alcohol use. What can I expect for my preventive care visit? Physical exam Your health care provider will check your:  Height and weight. These may be used to calculate your BMI (body mass index). BMI is a measurement that tells if you are at a healthy weight.  Heart rate and blood pressure.  Body temperature.  Skin for abnormal spots. Counseling Your health care provider may ask you questions about your:  Past medical problems.  Family's medical history.  Alcohol, tobacco, and drug use.  Emotional well-being.  Home life and relationship well-being.  Sexual activity.  Diet, exercise, and sleep habits.  History of falls.  Memory and ability to understand (cognition).  Work and work Statistician.  Pregnancy and menstrual history.  Access to firearms. What immunizations do I need? Vaccines are usually given at  various ages, according to a schedule. Your health care provider will recommend vaccines for you based on your age, medical history, and lifestyle or other factors, such as travel or where you work.   What tests do I need? Blood tests  Lipid and cholesterol levels. These may be checked every 5 years, or more often depending on your overall health.  Hepatitis C test.  Hepatitis B test. Screening  Lung cancer screening. You may have this screening every year starting at age 76 if you have a 30-pack-year history of smoking and currently smoke or have quit within the past 15 years.  Colorectal cancer screening. ? All adults should have this screening starting at age 13 and continuing until age 19. ? Your health care provider may recommend screening at age 71 if you are at increased risk. ? You will have tests every 1-10 years, depending on your results and the type of screening test.  Diabetes screening. ? This is done by checking your blood sugar (glucose) after you have not eaten for a while (fasting). ? You may have this done every 1-3 years.  Mammogram. ? This may be done every 1-2 years. ? Talk with your health care provider about how often you should have regular mammograms.  Abdominal aortic aneurysm (AAA) screening. You may need this if you are a current or former smoker.  BRCA-related cancer screening. This may be done if you have a family history of breast, ovarian, tubal, or peritoneal cancers. Other tests  STD (sexually transmitted disease) testing, if you are at risk.  Bone density scan. This is done to screen for osteoporosis. You may have this done starting at age 54. Talk with your health care provider about your test results, treatment options, and if necessary, the need for more tests. Follow these instructions at home: Eating and drinking  Eat a diet that includes fresh fruits and vegetables, whole grains, lean protein, and low-fat dairy products. Limit your  intake of foods with high amounts of sugar, saturated fats, and salt.  Take vitamin and mineral supplements as recommended by your health care provider.  Do not drink alcohol if your health care provider tells you not to drink.  If you drink alcohol: ? Limit how much you have to 0-1 drink a day. ? Be aware of how much alcohol is in your drink. In the [place] one drink equals one 12 oz bottle of beer (355 mL), one 5 oz glass of wine (148 mL), or one 1 oz glass of hard liquor (44 mL).   Lifestyle  Take daily care of your teeth and gums. Brush your teeth every morning and night with fluoride toothpaste. Floss one time each day.  Stay active. Exercise for at least 30 minutes 5 or more days each week.  Do not use any products that contain nicotine or tobacco, such as cigarettes, e-cigarettes, and chewing tobacco. If you need help quitting, ask your health care provider.  Do not use drugs.  If you are sexually active, practice safe sex. Use a condom or other form of protection in order to prevent STIs (sexually transmitted infections).  Talk with your health care provider about taking a low-dose aspirin or statin.  Find healthy ways to cope with stress, such as: ? Meditation, yoga, or listening to music. ? Journaling. ? Talking to a trusted person. ? Spending time with friends and family. Safety  Always wear your seat belt while driving or riding in a vehicle.  Do not drive: ? If you have been drinking alcohol. Do not ride with someone who has been drinking. ? When you are tired or distracted. ? While texting.  Wear a helmet and other protective equipment during sports activities.  If you have firearms in your house, make sure you follow all gun safety procedures. What's next?  Visit your health care provider once a year for an annual wellness visit.  Ask your health care provider how often you should have your eyes and teeth checked.  Stay up to date on all vaccines. This  information is not intended to replace advice given to you by your health care provider. Make sure you discuss any questions you have with your health care provider. Document Revised: 07/22/2020 Document Reviewed: 07/26/2018 Elsevier Patient Education  2021 [employer].

## 2020-10-09 LAB — URINALYSIS
Bilirubin Urine: NEGATIVE
Hgb urine dipstick: NEGATIVE
Ketones, ur: NEGATIVE
Leukocytes,Ua: NEGATIVE
Nitrite: NEGATIVE
Specific Gravity, Urine: 1.025 (ref 1.000–1.030)
Total Protein, Urine: NEGATIVE
Urine Glucose: NEGATIVE
Urobilinogen, UA: 0.2 (ref 0.0–1.0)
pH: 5.5 (ref 5.0–8.0)

## 2020-10-10 LAB — URINE CULTURE
MICRO NUMBER:: 11575005
SPECIMEN QUALITY:: ADEQUATE

## 2020-10-11 DIAGNOSIS — R3915 Urgency of urination: Secondary | ICD-10-CM | POA: Insufficient documentation

## 2020-10-11 NOTE — Assessment & Plan Note (Signed)
Encouraged heart healthy diet, increase exercise, avoid trans fats, consider a krill oil cap daily 

## 2020-10-11 NOTE — Assessment & Plan Note (Signed)
Check UA and culture 

## 2020-10-11 NOTE — Assessment & Plan Note (Signed)
On Levothyroxine, continue to monitor 

## 2020-10-11 NOTE — Progress Notes (Signed)
Subjective:    Patient ID: Susan Terrell, female    DOB: 31-Aug-1955, 66 y.o.   MRN: [id number]  No chief complaint on file.   HPI Patient is in today for annual preventative exam and follow up on chronic medical concerns. No recent febrile illness or hospitalizations. She tries to stay active and maintain a heart healthy diet. She hs trouble intermittently with Irritable Bowel Syndrome symptoms at times but is doing fairly well at times. Denies CP/palp/SOB/HA/congestion/fevers. Taking meds as prescribed  Past Medical History:  Diagnosis Date  . Anal fissure   . Asthma    childhood?  . Cervical cancer screening 09/01/2015  . Chicken pox as a child  . Elevated BP 11/10/2013  . Headache 03/01/2015  . Hyperlipidemia 04/13/2017  . Hypothyroidism 09/06/2015  . IBS (irritable bowel syndrome) 2010  . Measles as a child  . Mumps as a child  . Osteopenia 2013  . Pneumonia   . Preventative health care 09/01/2015  . UTI (lower urinary tract infection)   . Vitamin D deficiency 02/20/2015    Past Surgical History:  Procedure Laterality Date  . ABDOMINAL HYSTERECTOMY    . BREAST SURGERY     left needle biopsy  . INCISE AND DRAIN ABCESS     right groin  . PILONIDAL CYST EXCISION      Family History  Problem Relation Age of Onset  . Cancer Father        pancreatic   . Heart attack Brother   . Cancer Mother        colon  . Hyperlipidemia Mother   . Glaucoma Mother   . Thyroid disease Mother   . Hypertension Mother   . Cirrhosis Mother   . Kidney disease Mother   . Cataracts Sister   . Asthma Daughter   . Hypertension Daughter   . Cataracts Maternal Grandmother   . Alcohol abuse Maternal Grandfather   . Hyperlipidemia Sister   . Glaucoma Sister   . Arthritis Sister        rheumatoid  . Glaucoma Brother   . Hyperlipidemia Brother   . Hypertension Brother   . Other Brother        carotid artery disease  . Colitis Daughter   . Hyperlipidemia Daughter   . Asthma Daughter      Social History   Socioeconomic History  . Marital status: Widowed    Spouse name: Not on file  . Number of children: Not on file  . Years of education: Not on file  . Highest education level: Not on file  Occupational History  . Not on file  Tobacco Use  . Smoking status: Never Smoker  . Smokeless tobacco: Never Used  Substance and Sexual Activity  . Alcohol use: No  . Drug use: No  . Sexual activity: Yes    Birth control/protection: Surgical    Comment: lives with husband and daughter, no dietary restrictions.  Other Topics Concern  . Not on file  Social History Narrative  . Not on file   Social Determinants of Health   Financial Resource Strain: Not on file  Food Insecurity: Not on file  Transportation Needs: Not on file  Physical Activity: Not on file  Stress: Not on file  Social Connections: Not on file  Intimate Partner Violence: Not on file    Outpatient Medications Prior to Visit  Medication Sig Dispense Refill  . SYNTHROID 50 MCG tablet TAKE 1 TABLET(50 MCG) BY MOUTH DAILY 90  tablet 3  . Cholecalciferol (VITAMIN D) 2000 units CAPS Take 1 capsule (2,000 Units total) by mouth daily. (Patient not taking: Reported on 09/30/2019) 30 capsule   . amoxicillin (AMOXIL) 500 MG capsule Take 1 capsule (500 mg total) by mouth 3 (three) times daily. (Patient not taking: Reported on 02/28/2020) 30 capsule 0   No facility-administered medications prior to visit.    Allergies  Allergen Reactions  . Bee Venom     Swelling localized  . Codeine   . Iodine   . Macrodantin     Review of Systems  Constitutional: Negative for chills, fever and malaise/fatigue.  HENT: Negative for congestion and hearing loss.   Eyes: Negative for discharge.  Respiratory: Negative for cough, sputum production and shortness of breath.   Cardiovascular: Negative for chest pain, palpitations and leg swelling.  Gastrointestinal: Negative for abdominal pain, blood in stool, constipation,  diarrhea, heartburn, nausea and vomiting.  Genitourinary: Negative for dysuria, frequency, hematuria and urgency.  Musculoskeletal: Negative for back pain, falls and myalgias.  Skin: Negative for rash.  Neurological: Negative for dizziness, sensory change, loss of consciousness, weakness and headaches.  Endo/Heme/Allergies: Negative for environmental allergies. Does not bruise/bleed easily.  Psychiatric/Behavioral: Negative for depression and suicidal ideas. The patient is not nervous/anxious and does not have insomnia.        Objective:    Physical Exam Constitutional:      General: She is not in acute distress.    Appearance: She is well-developed and well-nourished.  HENT:     Head: Normocephalic and atraumatic.  Eyes:     Conjunctiva/sclera: Conjunctivae normal.  Neck:     Thyroid: No thyromegaly.  Cardiovascular:     Rate and Rhythm: Normal rate and regular rhythm.     Heart sounds: Normal heart sounds. No murmur heard.   Pulmonary:     Effort: Pulmonary effort is normal. No respiratory distress.     Breath sounds: Normal breath sounds.  Abdominal:     General: Bowel sounds are normal. There is no distension.     Palpations: Abdomen is soft. There is no mass.     Tenderness: There is no abdominal tenderness.  Musculoskeletal:        General: No edema.     Cervical back: Neck supple.  Lymphadenopathy:     Cervical: No cervical adenopathy.  Skin:    General: Skin is warm and dry.  Neurological:     Mental Status: She is alert and oriented to person, place, and time.  Psychiatric:        Mood and Affect: Mood and affect normal.        Behavior: Behavior normal.     BP 122/70   Pulse (!) 56   Temp 97.9 F (36.6 C)   Resp 16   Ht 5\' 2"  (1.575 m)   Wt 149 lb 3.2 oz (67.7 kg)   SpO2 98%   BMI 27.29 kg/m  Wt Readings from Last 3 Encounters:  10/08/20 149 lb 3.2 oz (67.7 kg)  03/19/20 144 lb (65.3 kg)  02/28/20 147 lb (66.7 kg)    Diabetic Foot Exam -  Simple   No data filed    Lab Results  Component Value Date   WBC 5.7 10/01/2020   HGB 13.9 10/01/2020   HCT 42.3 10/01/2020   PLT 229.0 10/01/2020   GLUCOSE 81 10/01/2020   CHOL 175 10/01/2020   TRIG 85.0 10/01/2020   HDL 51.40 10/01/2020   LDLCALC 106 (H)  10/01/2020   ALT 13 10/01/2020   AST 18 10/01/2020   NA 139 10/01/2020   K 3.8 10/01/2020   CL 105 10/01/2020   CREATININE 0.73 10/01/2020   BUN 15 10/01/2020   CO2 30 10/01/2020   TSH 4.13 10/01/2020   HGBA1C 5.7 10/01/2020    Lab Results  Component Value Date   TSH 4.13 10/01/2020   Lab Results  Component Value Date   WBC 5.7 10/01/2020   HGB 13.9 10/01/2020   HCT 42.3 10/01/2020   MCV 89.7 10/01/2020   PLT 229.0 10/01/2020   Lab Results  Component Value Date   NA 139 10/01/2020   K 3.8 10/01/2020   CO2 30 10/01/2020   GLUCOSE 81 10/01/2020   BUN 15 10/01/2020   CREATININE 0.73 10/01/2020   BILITOT 0.5 10/01/2020   ALKPHOS 77 10/01/2020   AST 18 10/01/2020   ALT 13 10/01/2020   PROT 6.6 10/01/2020   ALBUMIN 3.8 10/01/2020   CALCIUM 9.0 10/01/2020   GFR 85.60 10/01/2020   Lab Results  Component Value Date   CHOL 175 10/01/2020   Lab Results  Component Value Date   HDL 51.40 10/01/2020   Lab Results  Component Value Date   LDLCALC 106 (H) 10/01/2020   Lab Results  Component Value Date   TRIG 85.0 10/01/2020   Lab Results  Component Value Date   CHOLHDL 3 10/01/2020   Lab Results  Component Value Date   HGBA1C 5.7 10/01/2020       Assessment & Plan:   Problem List Items Addressed This Visit    Other abnormal glucose    hgba1c acceptable, minimize simple carbs. Increase exercise as tolerated      Vitamin D deficiency    Supplement and monitor      Relevant Orders   VITAMIN D 25 Hydroxy (Vit-D Deficiency, Fractures)   Comprehensive metabolic panel   Preventative health care    Patient encouraged to maintain heart healthy diet, regular exercise, adequate sleep. Consider  daily probiotics. Take medications as prescribed. Dexa and MGM completed on 10/02/20. Repeat MGM in 1-2 years and Dexa scan in 2-5 years. Follows with GYN for paps, Dr Ambrose Mantle. Labs ordered and reviewed.       Hypothyroidism    On Levothyroxine, continue to monitor      Relevant Orders   TSH   Hyperlipidemia    Encouraged heart healthy diet, increase exercise, avoid trans fats, consider a krill oil cap daily      Urinary urgency - Primary    Check UA and culture      Relevant Orders   Urinalysis (Completed)   Urine Culture (Completed)      I have discontinued Antasia T. Cadmus's amoxicillin. I am also having her start on Vitamin D (Ergocalciferol). Additionally, I am having her maintain her Vitamin D and Synthroid.  Meds ordered this encounter  Medications  . Vitamin D, Ergocalciferol, (DRISDOL) 1.25 MG (50000 UNIT) CAPS capsule    Sig: Take 1 capsule (50,000 Units total) by mouth every 7 (seven) days.    Dispense:  4 capsule    Refill:  4     Danise Edge, MD

## 2020-10-11 NOTE — Assessment & Plan Note (Addendum)
Patient encouraged to maintain heart healthy diet, regular exercise, adequate sleep. Consider daily probiotics. Take medications as prescribed. Dexa and MGM completed on 10/02/20. Repeat MGM in 1-2 years and Dexa scan in 2-5 years. Follows with GYN for paps, Dr Ambrose Mantle. Labs ordered and reviewed.

## 2020-10-14 ENCOUNTER — Encounter: Payer: Self-pay | Admitting: *Deleted

## 2020-10-16 ENCOUNTER — Telehealth: Payer: Self-pay

## 2020-10-16 DIAGNOSIS — N898 Other specified noninflammatory disorders of vagina: Secondary | ICD-10-CM | POA: Diagnosis not present

## 2020-10-16 DIAGNOSIS — R319 Hematuria, unspecified: Secondary | ICD-10-CM | POA: Diagnosis not present

## 2020-10-16 DIAGNOSIS — Z01419 Encounter for gynecological examination (general) (routine) without abnormal findings: Secondary | ICD-10-CM | POA: Diagnosis not present

## 2020-10-16 DIAGNOSIS — N952 Postmenopausal atrophic vaginitis: Secondary | ICD-10-CM | POA: Diagnosis not present

## 2020-10-16 NOTE — Telephone Encounter (Signed)
Spoke with Susan Terrell to let her know that her dexa scan showed osteopenia per Dr. Abner Greenspan. Susan Terrell is aware

## 2020-10-26 DIAGNOSIS — Z01419 Encounter for gynecological examination (general) (routine) without abnormal findings: Secondary | ICD-10-CM | POA: Diagnosis not present

## 2020-12-10 DIAGNOSIS — D2271 Melanocytic nevi of right lower limb, including hip: Secondary | ICD-10-CM | POA: Diagnosis not present

## 2020-12-10 DIAGNOSIS — L570 Actinic keratosis: Secondary | ICD-10-CM | POA: Diagnosis not present

## 2020-12-10 DIAGNOSIS — L308 Other specified dermatitis: Secondary | ICD-10-CM | POA: Diagnosis not present

## 2020-12-10 DIAGNOSIS — L821 Other seborrheic keratosis: Secondary | ICD-10-CM | POA: Diagnosis not present

## 2020-12-29 ENCOUNTER — Other Ambulatory Visit: Payer: Self-pay | Admitting: Family Medicine

## 2020-12-29 ENCOUNTER — Other Ambulatory Visit: Payer: Self-pay

## 2020-12-29 ENCOUNTER — Ambulatory Visit (INDEPENDENT_AMBULATORY_CARE_PROVIDER_SITE_OTHER): Payer: Medicare Other | Admitting: Family Medicine

## 2020-12-29 ENCOUNTER — Encounter: Payer: Self-pay | Admitting: Family Medicine

## 2020-12-29 VITALS — BP 124/68 | HR 68 | Temp 97.4°F | Resp 16 | Wt 147.0 lb

## 2020-12-29 DIAGNOSIS — M545 Low back pain, unspecified: Secondary | ICD-10-CM | POA: Diagnosis not present

## 2020-12-29 DIAGNOSIS — R350 Frequency of micturition: Secondary | ICD-10-CM

## 2020-12-29 LAB — URINALYSIS, ROUTINE W REFLEX MICROSCOPIC
Bilirubin Urine: NEGATIVE
Ketones, ur: NEGATIVE
Nitrite: NEGATIVE
Specific Gravity, Urine: 1.025 (ref 1.000–1.030)
Total Protein, Urine: NEGATIVE
Urine Glucose: NEGATIVE
Urobilinogen, UA: 0.2 (ref 0.0–1.0)
pH: 5.5 (ref 5.0–8.0)

## 2020-12-29 MED ORDER — TIZANIDINE HCL 2 MG PO TABS: 1.0000 mg | ORAL_TABLET | Freq: Four times a day (QID) | ORAL | 1 refills | Status: DC | PRN

## 2020-12-29 MED ORDER — AMOXICILLIN 500 MG PO CAPS: 500.0000 mg | ORAL_CAPSULE | Freq: Three times a day (TID) | ORAL | 0 refills | Status: AC

## 2020-12-29 MED ORDER — MELOXICAM 7.5 MG PO TABS: 7.5000 mg | ORAL_TABLET | Freq: Every day | ORAL | 0 refills | Status: DC

## 2020-12-29 NOTE — Assessment & Plan Note (Signed)
Check urinalysis and urine culture 

## 2020-12-29 NOTE — Patient Instructions (Signed)
Tylenol ES 500 mg tabs 1-2 tabs up to three times daily can mix with NSAIDS ie Meloxicam https://doi.org/[email or web address]/[email or web address]">  Chronic Back Pain When back pain lasts longer than 3 months, it is called chronic back pain. The cause of your back pain may not be known. Some common causes include:  Wear and tear (degenerative disease) of the bones, ligaments, or disks in your back.  Inflammation and stiffness in your back (arthritis). People who have chronic back pain often go through certain periods in which the pain is more intense (flare-ups). Many people can learn to manage the pain with home care. Follow these instructions at home: Pay attention to any changes in your symptoms. Take these actions to help with your pain: Managing pain and stiffness  If directed, apply ice to the painful area. Your health care provider may recommend applying ice during the first 24-48 hours after a flare-up begins. To do this: ? Put ice in a plastic bag. ? Place a towel between your skin and the bag. ? Leave the ice on for 20 minutes, 2-3 times per day.  If directed, apply heat to the affected area as often as told by your health care provider. Use the heat source that your health care provider recommends, such as a moist heat pack or a heating pad. ? Place a towel between your skin and the heat source. ? Leave the heat on for 20-30 minutes. ? Remove the heat if your skin turns bright red. This is especially important if you are unable to feel pain, heat, or cold. You may have a greater risk of getting burned.  Try soaking in a warm tub.      Activity  Avoid bending and other activities that make the problem worse.  Maintain a proper position when standing or sitting: ? When standing, keep your upper back and neck straight, with your shoulders pulled back. Avoid slouching. ? When sitting, keep your back straight and relax your shoulders. Do not round your shoulders or pull them backward.  Do not  sit or stand in one place for long periods of time.  Take brief periods of rest throughout the day. This will reduce your pain. Resting in a lying or standing position is usually better than sitting to rest.  When you are resting for longer periods, mix in some mild activity or stretching between periods of rest. This will help to prevent stiffness and pain.  Get regular exercise. Ask your health care provider what activities are safe for you.  Do not lift anything that is heavier than 10 lb (4.5 kg), or the limit that you are told, until your health care provider says that it is safe. Always use proper lifting technique, which includes: ? Bending your knees. ? Keeping the load close to your body. ? Avoiding twisting.  Sleep on a firm mattress in a comfortable position. Try lying on your side with your knees slightly bent. If you lie on your back, put a pillow under your knees.   Medicines  Treatment may include medicines for pain and inflammation taken by mouth or applied to the skin, prescription pain medicine, or muscle relaxants. Take over-the-counter and prescription medicines only as told by your health care provider.  Ask your health care provider if the medicine prescribed to you: ? Requires you to avoid driving or using machinery. ? Can cause constipation. You may need to take these actions to prevent or treat constipation:  Drink enough fluid to keep your urine  pale yellow.  Take over-the-counter or prescription medicines.  Eat foods that are high in fiber, such as beans, whole grains, and fresh fruits and vegetables.  Limit foods that are high in fat and processed sugars, such as fried or sweet foods. General instructions  Do not use any products that contain nicotine or tobacco, such as cigarettes, e-cigarettes, and chewing tobacco. If you need help quitting, ask your health care provider.  Keep all follow-up visits as told by your health care provider. This is  important. Contact a health care provider if:  You have pain that is not relieved with rest or medicine.  Your pain gets worse, or you have new pain.  You have a high fever.  You have rapid weight loss.  You have trouble doing your normal activities. Get help right away if:  You have weakness or numbness in one or both of your legs or feet.  You have trouble controlling your bladder or your bowels.  You have severe back pain and have any of the following: ? Nausea or vomiting. ? Pain in your abdomen. ? Shortness of breath or you faint. Summary  Chronic back pain is back pain that lasts longer than 3 months.  When a flare-up begins, apply ice to the painful area for the first 24-48 hours.  Apply a moist heat pad or use a heating pad on the painful area as directed by your health care provider.  When you are resting for longer periods, mix in some mild activity or stretching between periods of rest. This will help to prevent stiffness and pain. This information is not intended to replace advice given to you by your health care provider. Make sure you discuss any questions you have with your health care provider. Document Revised: 09/11/2019 Document Reviewed: 09/11/2019 Elsevier Patient Education  2021 [employer].

## 2020-12-29 NOTE — Progress Notes (Signed)
Patient ID: Susan Terrell, female    DOB: [birth year]  Age: 65 y.o. MRN: [id number]    Subjective:  Subjective  HPI [name] presents for office visit today for concerns of possibly having a UTI and experiencing lower back pain and for a follow up on medication management and HTN. She reports that her lower back pain has started a week ago last Saturday. She states that she applied heat to her back which she states has helped alleviate some of the pain. She states that she has not tried using a muscle relaxer yet. She expresses concern that she might have a UTI due to her symptoms of urinary urgency and she states that she is worried she might develop nausea and diarrhea later in the future. She denies any chest pain, SOB, fever, abdominal pain, cough, chills, sore throat, dysuria, urinary incontinence, blood in stool, HA, or N/VD. She reports that her lower back pain is 6-7/10.   Review of Systems  Constitutional: Negative for chills, fatigue and fever.  HENT: Negative for congestion, rhinorrhea, sinus pressure, sinus pain and sore throat.   Eyes: Negative for pain.  Respiratory: Negative for cough and shortness of breath.   Cardiovascular: Negative for chest pain, palpitations and leg swelling.  Gastrointestinal: Negative for abdominal pain, blood in stool, diarrhea, nausea and vomiting.  Genitourinary: Positive for urgency. Negative for decreased urine volume, flank pain, frequency, vaginal bleeding and vaginal discharge.  Musculoskeletal: Positive for back pain.  Neurological: Negative for headaches.    History Past Medical History:  Diagnosis Date  . Anal fissure   . Asthma    childhood?  . Cervical cancer screening 09/01/2015  . Chicken pox as a child  . Elevated BP 11/10/2013  . Headache 03/01/2015  . Hyperlipidemia 04/13/2017  . Hypothyroidism 09/06/2015  . IBS (irritable bowel syndrome) 2010  . Measles as a child  . Mumps as a child  . Osteopenia 2013  . Pneumonia   .  Preventative health care 09/01/2015  . UTI (lower urinary tract infection)   . Vitamin D deficiency 02/20/2015    She has a past surgical history that includes Abdominal hysterectomy; Pilonidal cyst excision; Incise and drain abcess; and Breast surgery.   Her family history includes Alcohol abuse in her maternal grandfather; Arthritis in her sister; Asthma in her daughter and daughter; Cancer in her father and mother; Cataracts in her maternal grandmother and sister; Cirrhosis in her mother; Colitis in her daughter; Glaucoma in her brother, mother, and sister; Heart attack in her brother; Hyperlipidemia in her brother, daughter, mother, and sister; Hypertension in her brother, daughter, and mother; Kidney disease in her mother; Other in her brother; Thyroid disease in her mother.She reports that she has never smoked. She has never used smokeless tobacco. She reports that she does not drink alcohol and does not use drugs.  Current Outpatient Medications on File Prior to Visit  Medication Sig Dispense Refill  . betamethasone dipropionate 0.05 % cream betamethasone dipropionate 0.05 % topical cream    . Cholecalciferol (VITAMIN D) 2000 units CAPS Take 1 capsule (2,000 Units total) by mouth daily. 30 capsule   . estradiol (ESTRACE) 0.1 MG/GM vaginal cream estradiol 0.01% (0.1 mg/gram) vaginal cream    . SYNTHROID 50 MCG tablet TAKE 1 TABLET(50 MCG) BY MOUTH DAILY 90 tablet 3  . Vitamin D, Ergocalciferol, (DRISDOL) 1.25 MG (50000 UNIT) CAPS capsule Take 1 capsule (50,000 Units total) by mouth every 7 (seven) days. 4 capsule 4  No current facility-administered medications on file prior to visit.     Objective:  Objective  Physical Exam Constitutional:      General: She is not in acute distress.    Appearance: Normal appearance. She is not ill-appearing or toxic-appearing.  HENT:     Head: Normocephalic and atraumatic.     Right Ear: Tympanic membrane, ear canal and external ear normal.     Left  Ear: Tympanic membrane, ear canal and external ear normal.     Nose: No congestion or rhinorrhea.  Eyes:     Extraocular Movements: Extraocular movements intact.     Pupils: Pupils are equal, round, and reactive to light.  Cardiovascular:     Rate and Rhythm: Normal rate and regular rhythm.     Pulses: Normal pulses.     Heart sounds: Normal heart sounds. No murmur heard.   Pulmonary:     Effort: Pulmonary effort is normal. No respiratory distress.     Breath sounds: Normal breath sounds. No wheezing, rhonchi or rales.  Abdominal:     General: Bowel sounds are normal.     Palpations: Abdomen is soft. There is no mass.     Tenderness: There is no abdominal tenderness. There is no guarding.     Hernia: No hernia is present.  Musculoskeletal:        General: Normal range of motion.     Cervical back: Normal range of motion and neck supple.     Lumbar back: Tenderness present.     Comments: -paravertebral muscle and sacral joint tenderness when palpated  Skin:    General: Skin is warm and dry.  Neurological:     Mental Status: She is alert and oriented to person, place, and time.  Psychiatric:        Behavior: Behavior normal.    BP 124/68   Pulse 68   Temp (!) 97.4 F (36.3 C)   Resp 16   Wt 147 lb (66.7 kg)   SpO2 97%   BMI 26.89 kg/m  Wt Readings from Last 3 Encounters:  12/29/20 147 lb (66.7 kg)  10/08/20 149 lb 3.2 oz (67.7 kg)  03/19/20 144 lb (65.3 kg)     Lab Results  Component Value Date   WBC 5.7 10/01/2020   HGB 13.9 10/01/2020   HCT 42.3 10/01/2020   PLT 229.0 10/01/2020   GLUCOSE 81 10/01/2020   CHOL 175 10/01/2020   TRIG 85.0 10/01/2020   HDL 51.40 10/01/2020   LDLCALC 106 (H) 10/01/2020   ALT 13 10/01/2020   AST 18 10/01/2020   NA 139 10/01/2020   K 3.8 10/01/2020   CL 105 10/01/2020   CREATININE 0.73 10/01/2020   BUN 15 10/01/2020   CO2 30 10/01/2020   TSH 4.13 10/01/2020   HGBA1C 5.7 10/01/2020    DG Clavicle Right  Result Date:  02/28/2020 CLINICAL DATA:  Prominence over the right sternoclavicular joint for 2 weeks. No known injury. EXAM: RIGHT CLAVICLE - 2+ VIEWS COMPARISON:  Right shoulder radiographs 09/16/2014. FINDINGS: Two views were obtained with a marker over the right clavicular head. No evidence of acute fracture or dislocation. No erosive changes of the clavicular head are demonstrated. There is no significant acromioclavicular joint arthropathy. 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 IMPRESSION: No acute osseous findings or significant sternoclavicular arthropathy identified. Clinical follow up of any palpable concern recommended. Electronically Signed   By: Hilarie Fredrickson.D.  On: 02/28/2020 09:12     Assessment & Plan:  Plan    Meds ordered this encounter  Medications  . tiZANidine (ZANAFLEX) 2 MG tablet    Sig: Take 0.5-2 tablets (1-4 mg total) by mouth every 6 (six) hours as needed for muscle spasms.    Dispense:  30 tablet    Refill:  1  . meloxicam (MOBIC) 7.5 MG tablet    Sig: Take 1 tablet (7.5 mg total) by mouth daily.    Dispense:  30 tablet    Refill:  0    Problem List Items Addressed This Visit    Urinary frequency - Primary    Check urinalysis and urine culture.       Relevant Orders   Urinalysis   Urine Culture   Low back pain    Encouraged moist heat and gentle stretching as tolerated. May try NSAIDs and prescription meds as directed and report if symptoms worsen or seek immediate care. Meloxicam 7.5 mg daily Tylenol prn as directed. Tizanidine 1-4 mg prn      Relevant Medications   tiZANidine (ZANAFLEX) 2 MG tablet   meloxicam (MOBIC) 7.5 MG tablet      Follow-up: No follow-ups on file.   I,David Hanna,acting as a scribe for Danise Edge, MD.,have documented all relevant documentation on the behalf of Danise Edge, MD,as directed by  Danise Edge, MD while in the presence of Danise Edge, MD.  I,  Bradd Canary, MD personally performed the services described in this documentation. All medical record entries made by the scribe were at my direction and in my presence. I have reviewed the chart and agree that the record reflects my personal performance and is accurate and complete

## 2020-12-29 NOTE — Assessment & Plan Note (Signed)
Encouraged moist heat and gentle stretching as tolerated. May try NSAIDs and prescription meds as directed and report if symptoms worsen or seek immediate care. Meloxicam 7.5 mg daily Tylenol prn as directed. Tizanidine 1-4 mg prn

## 2020-12-30 LAB — URINE CULTURE
MICRO NUMBER:: 11899963
SPECIMEN QUALITY:: ADEQUATE

## 2021-01-06 ENCOUNTER — Other Ambulatory Visit: Payer: Self-pay | Admitting: Family Medicine

## 2021-01-06 ENCOUNTER — Other Ambulatory Visit: Payer: Self-pay

## 2021-01-06 ENCOUNTER — Other Ambulatory Visit (INDEPENDENT_AMBULATORY_CARE_PROVIDER_SITE_OTHER): Payer: Medicare Other

## 2021-01-06 DIAGNOSIS — E559 Vitamin D deficiency, unspecified: Secondary | ICD-10-CM | POA: Diagnosis not present

## 2021-01-06 DIAGNOSIS — E039 Hypothyroidism, unspecified: Secondary | ICD-10-CM

## 2021-01-06 LAB — COMPREHENSIVE METABOLIC PANEL
ALT: 13 U/L (ref 0–35)
AST: 19 U/L (ref 0–37)
Albumin: 4 g/dL (ref 3.5–5.2)
Alkaline Phosphatase: 76 U/L (ref 39–117)
BUN: 16 mg/dL (ref 6–23)
CO2: 29 mEq/L (ref 19–32)
Calcium: 9 mg/dL (ref 8.4–10.5)
Chloride: 106 mEq/L (ref 96–112)
Creatinine, Ser: 0.66 mg/dL (ref 0.40–1.20)
GFR: 91.13 mL/min (ref >60.00)
Glucose, Bld: 87 mg/dL (ref 70–99)
Potassium: 4 mEq/L (ref 3.5–5.1)
Sodium: 141 mEq/L (ref 135–145)
Total Bilirubin: 0.5 mg/dL (ref 0.2–1.2)
Total Protein: 6.8 g/dL (ref 6.0–8.3)

## 2021-01-06 LAB — VITAMIN D 25 HYDROXY (VIT D DEFICIENCY, FRACTURES): VITD: 27.55 ng/mL — ABNORMAL LOW (ref 30.00–100.00)

## 2021-01-06 LAB — TSH: TSH: 3.48 u[IU]/mL (ref 0.35–4.50)

## 2021-01-07 ENCOUNTER — Other Ambulatory Visit: Payer: Self-pay

## 2021-01-07 MED ORDER — VITAMIN D (ERGOCALCIFEROL) 1.25 MG (50000 UNIT) PO CAPS: 50000.0000 [IU] | ORAL_CAPSULE | ORAL | 4 refills | Status: DC

## 2021-01-21 ENCOUNTER — Encounter: Payer: Medicare Other | Admitting: Family Medicine

## 2021-02-10 ENCOUNTER — Other Ambulatory Visit (HOSPITAL_COMMUNITY): Payer: Self-pay

## 2021-02-10 ENCOUNTER — Encounter: Payer: Self-pay | Admitting: Family Medicine

## 2021-02-10 ENCOUNTER — Telehealth: Payer: Self-pay

## 2021-02-10 ENCOUNTER — Telehealth (INDEPENDENT_AMBULATORY_CARE_PROVIDER_SITE_OTHER): Payer: Medicare Other | Admitting: Family Medicine

## 2021-02-10 VITALS — Ht 62.0 in

## 2021-02-10 DIAGNOSIS — U071 COVID-19: Secondary | ICD-10-CM | POA: Diagnosis not present

## 2021-02-10 DIAGNOSIS — Z8616 Personal history of COVID-19: Secondary | ICD-10-CM | POA: Insufficient documentation

## 2021-02-10 MED ORDER — NIRMATRELVIR/RITONAVIR (PAXLOVID) TABLET (RENAL DOSING)
2.0000 | ORAL_TABLET | Freq: Two times a day (BID) | ORAL | 0 refills | Status: DC
  Filled 2021-02-10: qty 20, 5d supply, fill #0

## 2021-02-10 MED ORDER — NIRMATRELVIR/RITONAVIR (PAXLOVID)TABLET
3.0000 | ORAL_TABLET | Freq: Two times a day (BID) | ORAL | 0 refills | Status: AC
  Filled 2021-02-10: qty 30, 5d supply, fill #0

## 2021-02-10 NOTE — Addendum Note (Signed)
Addended by: Andrez Grime on: 02/10/2021 12:22 PM   Modules accepted: Orders

## 2021-02-10 NOTE — Progress Notes (Signed)
Established Patient Office Visit  Subjective:  Patient ID: Susan Terrell, female    DOB: [birth year]  Age: 65 y.o. MRN: [id number]  CC:  Chief Complaint  Patient presents with   Covid Positive    Sore/scratchy throat, sinus drainage with some congestion symptoms x 4 days. Positive for covid last night.     HPI [name] presents for evaluation and possible treatment of a 4-day history of nasal congestion postnasal drip, sore throat and a nonproductive cough.  There was some loose stool on the first day of illness.  There is been no malaise.  Denies fevers chills.  Denies alteration in taste or smell.  History of asthma but denies wheezing with this illness.  She has finished her COVID vaccination series but has not had a booster.  She has ongoing postnasal drip with intermittent nasal congestion.  Tested positive for COVID with a home test that she purchased at her pharmacy.  Past Medical History:  Diagnosis Date   Anal fissure    Asthma    childhood?   Cervical cancer screening 09/01/2015   Chicken pox as a child   Elevated BP 11/10/2013   Headache 03/01/2015   Hyperlipidemia 04/13/2017   Hypothyroidism 09/06/2015   IBS (irritable bowel syndrome) 2010   Measles as a child   Mumps as a child   Osteopenia 2013   Pneumonia    Preventative health care 09/01/2015   UTI (lower urinary tract infection)    Vitamin D deficiency 02/20/2015    Past Surgical History:  Procedure Laterality Date   ABDOMINAL HYSTERECTOMY     BREAST SURGERY     left needle biopsy   INCISE AND DRAIN ABCESS     right groin   PILONIDAL CYST EXCISION      Family History  Problem Relation Age of Onset   Cancer Father        pancreatic    Heart attack Brother    Cancer Mother        colon   Hyperlipidemia Mother    Glaucoma Mother    Thyroid disease Mother    Hypertension Mother    Cirrhosis Mother    Kidney disease Mother    Cataracts Sister    Asthma Daughter    Hypertension Daughter     Cataracts Maternal Grandmother    Alcohol abuse Maternal Grandfather    Hyperlipidemia Sister    Glaucoma Sister    Arthritis Sister        rheumatoid   Glaucoma Brother    Hyperlipidemia Brother    Hypertension Brother    Other Brother        carotid artery disease   Colitis Daughter    Hyperlipidemia Daughter    Asthma Daughter     Social History   Socioeconomic History   Marital status: Widowed    Spouse name: Not on file   Number of children: Not on file   Years of education: Not on file   Highest education level: Not on file  Occupational History   Not on file  Tobacco Use   Smoking status: Never   Smokeless tobacco: Never  Substance and Sexual Activity   Alcohol use: No   Drug use: No   Sexual activity: Yes    Birth control/protection: Surgical    Comment: lives with husband and daughter, no dietary restrictions.  Other Topics Concern   Not on file  Social History Narrative   Not on file  Social Determinants of Health   Financial Resource Strain: Not on file  Food Insecurity: Not on file  Transportation Needs: Not on file  Physical Activity: Not on file  Stress: Not on file  Social Connections: Not on file  Intimate Partner Violence: Not on file    Outpatient Medications Prior to Visit  Medication Sig Dispense Refill   levothyroxine (SYNTHROID) 50 MCG tablet Take 1 tablet (50 mcg total) by mouth daily before breakfast. 90 tablet 1   Misc Natural Products (ELDERBERRY ZINC/VIT C/IMMUNE MT) Elderberry Zinc Vit C     Vitamin D, Ergocalciferol, (DRISDOL) 1.25 MG (50000 UNIT) CAPS capsule Take 1 capsule (50,000 Units total) by mouth every 7 (seven) days. 4 capsule 4   Cholecalciferol (VITAMIN D) 2000 units CAPS Take 1 capsule (2,000 Units total) by mouth daily. 30 capsule    estradiol (ESTRACE) 0.1 MG/GM vaginal cream estradiol 0.01% (0.1 mg/gram) vaginal cream (Patient not taking: Reported on 02/10/2021)     tiZANidine (ZANAFLEX) 2 MG tablet Take 0.5-2  tablets (1-4 mg total) by mouth every 6 (six) hours as needed for muscle spasms. (Patient not taking: Reported on 02/10/2021) 30 tablet 1   betamethasone dipropionate 0.05 % cream betamethasone dipropionate 0.05 % topical cream     meloxicam (MOBIC) 7.5 MG tablet Take 1 tablet (7.5 mg total) by mouth daily. 30 tablet 0   No facility-administered medications prior to visit.    Allergies  Allergen Reactions   Bee Venom     Swelling localized   Codeine    Iodine    Macrodantin     ROS Review of Systems  Constitutional:  Negative for diaphoresis, fatigue, fever and unexpected weight change.  HENT:  Positive for congestion, postnasal drip and sore throat.   Eyes:  Negative for photophobia and visual disturbance.  Respiratory:  Positive for cough. Negative for shortness of breath and wheezing.   Cardiovascular: Negative.   Gastrointestinal:  Negative for abdominal pain, diarrhea, nausea and vomiting.  Musculoskeletal:  Negative for arthralgias and myalgias.  Neurological:  Positive for headaches.     Objective:    Physical Exam Vitals and nursing note reviewed.  Constitutional:      General: She is not in acute distress.    Appearance: Normal appearance. She is not ill-appearing, toxic-appearing or diaphoretic.  HENT:     Head: Normocephalic and atraumatic.  Eyes:     General:        Right eye: No discharge.        Left eye: No discharge.     Conjunctiva/sclera: Conjunctivae normal.  Pulmonary:     Effort: Pulmonary effort is normal.  Neurological:     Mental Status: She is alert and oriented to person, place, and time.  Psychiatric:        Mood and Affect: Mood normal.        Behavior: Behavior normal.    Ht 5\' 2"  (1.575 m)   BMI 26.89 kg/m  Wt Readings from Last 3 Encounters:  12/29/20 147 lb (66.7 kg)  10/08/20 149 lb 3.2 oz (67.7 kg)  03/19/20 144 lb (65.3 kg)     Health Maintenance Due  Topic Date Due   COLONOSCOPY (Pts 45-96yrs Insurance coverage will  need to be confirmed)  Never done   Zoster Vaccines- Shingrix (1 of 2) Never done   PNA vac Low Risk Adult (1 of 2 - PCV13) Never done    There are no preventive care reminders to display for this patient.  Lab Results  Component Value Date   TSH 3.48 01/06/2021   Lab Results  Component Value Date   WBC 5.7 10/01/2020   HGB 13.9 10/01/2020   HCT 42.3 10/01/2020   MCV 89.7 10/01/2020   PLT 229.0 10/01/2020   Lab Results  Component Value Date   NA 141 01/06/2021   K 4.0 01/06/2021   CO2 29 01/06/2021   GLUCOSE 87 01/06/2021   BUN 16 01/06/2021   CREATININE 0.66 01/06/2021   BILITOT 0.5 01/06/2021   ALKPHOS 76 01/06/2021   AST 19 01/06/2021   ALT 13 01/06/2021   PROT 6.8 01/06/2021   ALBUMIN 4.0 01/06/2021   CALCIUM 9.0 01/06/2021   GFR 91.13 01/06/2021   Lab Results  Component Value Date   CHOL 175 10/01/2020   Lab Results  Component Value Date   HDL 51.40 10/01/2020   Lab Results  Component Value Date   LDLCALC 106 (H) 10/01/2020   Lab Results  Component Value Date   TRIG 85.0 10/01/2020   Lab Results  Component Value Date   CHOLHDL 3 10/01/2020   Lab Results  Component Value Date   HGBA1C 5.7 10/01/2020      Assessment & Plan:   Problem List Items Addressed This Visit       Other   COVID-19 - Primary   Relevant Medications   nirmatrelvir/ritonavir EUA, renal dosing, (PAXLOVID) TABS    Meds ordered this encounter  Medications   nirmatrelvir/ritonavir EUA, renal dosing, (PAXLOVID) TABS    Sig: Take 2 tablets by mouth 2 (two) times daily for 5 days. (Take nirmatrelvir 150 mg one tablet twice daily for 5 days and ritonavir 100 mg one tablet twice daily for 5 days) Patient GFR is 91    Dispense:  20 tablet    Refill:  0    Follow-up: Return in about 1 week (around 02/17/2021), or if symptoms worsen or fail to improve.  Advised to proceed to the emergency room with increasing difficulty of breathing and/or cough productive of purulent  phlegm.  Mliss Sax, MD  Virtual Visit via Video Note  I connected with [name] on 02/10/21 at  9:30 AM EDT by a video enabled telemedicine application and verified that I am speaking with the correct person using two identifiers.  Location: Patient: home alone.  Provider: work   I discussed the limitations of evaluation and management by telemedicine and the availability of in person appointments. The patient expressed understanding and agreed to proceed.  History of Present Illness:    Observations/Objective:   Assessment and Plan:   Follow Up Instructions:    I discussed the assessment and treatment plan with the patient. The patient was provided an opportunity to ask questions and all were answered. The patient agreed with the plan and demonstrated an understanding of the instructions.   The patient was advised to call back or seek an in-person evaluation if the symptoms worsen or if the condition fails to improve as anticipated.  I provided 25 minutes of non-face-to-face time during this encounter.   Mliss Sax, MD

## 2021-02-10 NOTE — Telephone Encounter (Signed)
Pharmacist calling states that patient needs a new Rx for Paxlovid sent in due to patients GRF levels being at 90 she will need the regular Paxlovid instead of renal. Please advise.

## 2021-02-16 ENCOUNTER — Other Ambulatory Visit (HOSPITAL_COMMUNITY): Payer: Self-pay

## 2021-02-16 ENCOUNTER — Telehealth: Payer: Self-pay

## 2021-02-16 NOTE — Telephone Encounter (Signed)
Patient called stating she had a virtual visit with Dr. Doreene Burke last week and was prescribed paxlovid and is feeling much better and was wanting to know if she can go back to work as soon as today.  Also, Patient is wanting to know how soon she can get the Covid Booster after having Covid.  Please advise.  Patient stated she does not need a letter to return to work as she was already on vacation when she got sick. CB # [phone].

## 2021-02-17 NOTE — Telephone Encounter (Signed)
LVM to call back.

## 2021-02-19 NOTE — Telephone Encounter (Signed)
How soon can she get the covid booster after having this?

## 2021-02-19 NOTE — Telephone Encounter (Signed)
Left message on machine to call back  

## 2021-02-22 NOTE — Telephone Encounter (Signed)
Left message on machine to call back  

## 2021-02-22 NOTE — Telephone Encounter (Signed)
Unable to reach letter sent

## 2021-03-05 NOTE — Telephone Encounter (Signed)
Patient called back states she is doing fine  , no sx from covid , apologizes for playing phone tag.

## 2021-04-08 ENCOUNTER — Other Ambulatory Visit: Payer: Medicare Other

## 2021-04-12 ENCOUNTER — Ambulatory Visit (INDEPENDENT_AMBULATORY_CARE_PROVIDER_SITE_OTHER): Payer: Medicare Other | Admitting: Family Medicine

## 2021-04-12 ENCOUNTER — Other Ambulatory Visit: Payer: Self-pay

## 2021-04-12 ENCOUNTER — Other Ambulatory Visit (INDEPENDENT_AMBULATORY_CARE_PROVIDER_SITE_OTHER): Payer: Medicare Other

## 2021-04-12 ENCOUNTER — Other Ambulatory Visit: Payer: Self-pay | Admitting: Family Medicine

## 2021-04-12 DIAGNOSIS — R7989 Other specified abnormal findings of blood chemistry: Secondary | ICD-10-CM

## 2021-04-12 LAB — COMPREHENSIVE METABOLIC PANEL
ALT: 16 U/L (ref 0–35)
AST: 22 U/L (ref 0–37)
Albumin: 3.9 g/dL (ref 3.5–5.2)
Alkaline Phosphatase: 75 U/L (ref 39–117)
BUN: 19 mg/dL (ref 6–23)
CO2: 27 mEq/L (ref 19–32)
Calcium: 8.9 mg/dL (ref 8.4–10.5)
Chloride: 105 mEq/L (ref 96–112)
Creatinine, Ser: 0.68 mg/dL (ref 0.40–1.20)
GFR: 90.31 mL/min (ref >60.00)
Glucose, Bld: 76 mg/dL (ref 70–99)
Potassium: 4.5 mEq/L (ref 3.5–5.1)
Sodium: 140 mEq/L (ref 135–145)
Total Bilirubin: 0.4 mg/dL (ref 0.2–1.2)
Total Protein: 6.7 g/dL (ref 6.0–8.3)

## 2021-04-12 LAB — VITAMIN D 25 HYDROXY (VIT D DEFICIENCY, FRACTURES): VITD: 34.7 ng/mL (ref 30.00–100.00)

## 2021-04-12 NOTE — Progress Notes (Signed)
Patient not seen.

## 2021-04-14 ENCOUNTER — Encounter: Payer: Self-pay | Admitting: *Deleted

## 2021-06-23 ENCOUNTER — Telehealth: Payer: Self-pay

## 2021-06-23 ENCOUNTER — Ambulatory Visit: Payer: Medicare Other

## 2021-06-23 ENCOUNTER — Telehealth: Payer: Self-pay | Admitting: Family Medicine

## 2021-06-23 NOTE — Telephone Encounter (Signed)
Patient has a 7:40am appt for her Medicare Wellness exam(phone visit). Attempted to reach patient x 3 . Left message for patient to call back.

## 2021-06-23 NOTE — Telephone Encounter (Signed)
If she has a cold recommend rest, fluids, Tylenol, Robitussin-DM, Flonase. Office visit if symptoms severe

## 2021-06-23 NOTE — Telephone Encounter (Signed)
Went straight to vm but it was not set up

## 2021-06-23 NOTE — Telephone Encounter (Signed)
Pt called because she missed her appt today because she has a sore throat. She did not want to make an appt but was wondering if a nurse could advise her on over the counter remedies, please advise.

## 2021-06-23 NOTE — Progress Notes (Deleted)
Subjective:   Susan Terrell is a 65 y.o. female who presents for an Initial Medicare Annual Wellness Visit.   I connected with Susan Terrell today by telephone and verified that I am speaking with the correct person using two identifiers. Location patient: home Location provider: work Persons participating in the virtual visit: patient, Engineer, civil (consulting).    I discussed the limitations, risks, security and privacy concerns of performing an evaluation and management service by telephone and the availability of in person appointments. I also discussed with the patient that there may be a patient responsible charge related to this service. The patient expressed understanding and verbally consented to this telephonic visit.    Interactive audio and video telecommunications were attempted between this provider and patient, however failed, due to patient having technical difficulties OR patient did not have access to video capability.  We continued and completed visit with audio only.  Some vital signs may be absent or patient reported.   Time Spent with patient on telephone encounter: *** minutes  Review of Systems    ***       Objective:    There were no vitals filed for this visit. There is no height or weight on file to calculate BMI.  No flowsheet data found.  Current Medications (verified) Outpatient Encounter Medications as of 06/23/2021  Medication Sig   estradiol (ESTRACE) 0.1 MG/GM vaginal cream estradiol 0.01% (0.1 mg/gram) vaginal cream (Patient not taking: No sig reported)   levothyroxine (SYNTHROID) 50 MCG tablet Take 1 tablet (50 mcg total) by mouth daily before breakfast.   Misc Natural Products (ELDERBERRY ZINC/VIT C/IMMUNE MT) Elderberry Zinc Vit C   tiZANidine (ZANAFLEX) 2 MG tablet Take 0.5-2 tablets (1-4 mg total) by mouth every 6 (six) hours as needed for muscle spasms. (Patient not taking: No sig reported)   Vitamin D, Ergocalciferol, (DRISDOL) 1.25 MG (50000 UNIT) CAPS capsule  Take 1 capsule (50,000 Units total) by mouth every 7 (seven) days.   No facility-administered encounter medications on file as of 06/23/2021.    Allergies (verified) Bee venom, Codeine, Iodine, and Macrodantin   History: Past Medical History:  Diagnosis Date   Anal fissure    Asthma    childhood?   Cervical cancer screening 09/01/2015   Chicken pox as a child   Elevated BP 11/10/2013   Headache 03/01/2015   Hyperlipidemia 04/13/2017   Hypothyroidism 09/06/2015   IBS (irritable bowel syndrome) 2010   Measles as a child   Mumps as a child   Osteopenia 2013   Pneumonia    Preventative health care 09/01/2015   UTI (lower urinary tract infection)    Vitamin D deficiency 02/20/2015   Past Surgical History:  Procedure Laterality Date   ABDOMINAL HYSTERECTOMY     BREAST SURGERY     left needle biopsy   INCISE AND DRAIN ABCESS     right groin   PILONIDAL CYST EXCISION     Family History  Problem Relation Age of Onset   Cancer Father        pancreatic    Heart attack Brother    Cancer Mother        colon   Hyperlipidemia Mother    Glaucoma Mother    Thyroid disease Mother    Hypertension Mother    Cirrhosis Mother    Kidney disease Mother    Cataracts Sister    Asthma Daughter    Hypertension Daughter    Cataracts Maternal Grandmother    Alcohol abuse  Maternal Grandfather    Hyperlipidemia Sister    Glaucoma Sister    Arthritis Sister        rheumatoid   Glaucoma Brother    Hyperlipidemia Brother    Hypertension Brother    Other Brother        carotid artery disease   Colitis Daughter    Hyperlipidemia Daughter    Asthma Daughter    Social History   Socioeconomic History   Marital status: Widowed    Spouse name: Not on file   Number of children: Not on file   Years of education: Not on file   Highest education level: Not on file  Occupational History   Not on file  Tobacco Use   Smoking status: Never   Smokeless tobacco: Never  Substance and Sexual  Activity   Alcohol use: No   Drug use: No   Sexual activity: Yes    Birth control/protection: Surgical    Comment: lives with husband and daughter, no dietary restrictions.  Other Topics Concern   Not on file  Social History Narrative   Not on file   Social Determinants of Health   Financial Resource Strain: Not on file  Food Insecurity: Not on file  Transportation Needs: Not on file  Physical Activity: Not on file  Stress: Not on file  Social Connections: Not on file    Tobacco Counseling Counseling given: Not Answered   Clinical Intake:                 Diabetic?No         Activities of Daily Living In your present state of health, do you have any difficulty performing the following activities: 10/08/2020  Hearing? N  Vision? N  Difficulty concentrating or making decisions? N  Walking or climbing stairs? N  Dressing or bathing? N  Doing errands, shopping? N  Some recent data might be hidden    Patient Care Team: Mosie Lukes, MD as PCP - General (Family Medicine)  Indicate any recent Medical Services you may have received from other than [facility] providers in the past year (date may be approximate).     Assessment:   This is a routine wellness examination for Susan Terrell.  Hearing/Vision screen No results found.  Dietary issues and exercise activities discussed:     Goals Addressed   None    Depression Screen PHQ 2/9 Scores 02/10/2021 10/08/2020 09/30/2019 05/21/2018 05/18/2017 06/29/2016 06/07/2016  PHQ - 2 Score 0 0 0 0 0 0 0  PHQ- 9 Score - 1 - - - - -    Fall Risk Fall Risk  02/10/2021 10/08/2020 09/30/2019 05/21/2018 05/18/2017  Falls in the past year? 0 1 0 No No  Number falls in past yr: - 0 0 - -  Injury with Fall? - 0 0 - -    FALL RISK PREVENTION PERTAINING TO THE HOME:  Any stairs in or around the home? {YES/NO:21197} If so, are there any without handrails? {YES/NO:21197} Home free of loose throw rugs in walkways, pet beds,  electrical cords, etc? {YES/NO:21197} Adequate lighting in your home to reduce risk of falls? {YES/NO:21197}  ASSISTIVE DEVICES UTILIZED TO PREVENT FALLS:  Life alert? {YES/NO:21197} Use of a cane, walker or w/c? {YES/NO:21197} Grab bars in the bathroom? {YES/NO:21197} Shower chair or bench in shower? {YES/NO:21197} Elevated toilet seat or a handicapped toilet? {YES/NO:21197}  TIMED UP AND GO:  Was the test performed? {YES/NO:21197}.  Length of time to ambulate 10 feet: ***  sec.   {Appearance of YN:9739091  Cognitive Function:        Immunizations Immunization History  Administered Date(s) Administered   PFIZER(Purple Top)SARS-COV-2 Vaccination 11/01/2019, 11/27/2019   Tdap 08/15/2010    TDAP status: Due, Education has been provided regarding the importance of this vaccine. Advised may receive this vaccine at local pharmacy or Health Dept. Aware to provide a copy of the vaccination record if obtained from local pharmacy or Health Dept. Verbalized acceptance and understanding.  {Flu Vaccine status:2101806}  Pneumococcal vaccine status: Due, Education has been provided regarding the importance of this vaccine. Advised may receive this vaccine at local pharmacy or Health Dept. Aware to provide a copy of the vaccination record if obtained from local pharmacy or Health Dept. Verbalized acceptance and understanding.  {Covid-19 vaccine status:2101808}  Qualifies for Shingles Vaccine? Yes   Zostavax completed No   Shingrix Completed?: No.    Education has been provided regarding the importance of this vaccine. Patient has been advised to call insurance company to determine out of pocket expense if they have not yet received this vaccine. Advised may also receive vaccine at local pharmacy or Health Dept. Verbalized acceptance and understanding.  Screening Tests Health Maintenance  Topic Date Due   Pneumonia Vaccine 55+ Years old (1 - PCV) Never done   COLONOSCOPY (Pts 45-60yrs  Insurance coverage will need to be confirmed)  Never done   Zoster Vaccines- Shingrix (1 of 2) Never done   INFLUENZA VACCINE  03/15/2021   TETANUS/TDAP  09/12/2021   MAMMOGRAM  10/02/2022   DEXA SCAN  10/02/2022   Hepatitis C Screening  Completed   HPV VACCINES  Aged Out   COVID-19 Vaccine  Discontinued    Health Maintenance  Health Maintenance Due  Topic Date Due   Pneumonia Vaccine 84+ Years old (1 - PCV) Never done   COLONOSCOPY (Pts 45-57yrs Insurance coverage will need to be confirmed)  Never done   Zoster Vaccines- Shingrix (1 of 2) Never done   INFLUENZA VACCINE  03/15/2021    {Colorectal cancer screening:2101809}  Mammogram status: Completed bilateral 10/02/2020. Repeat every year  Bone Density status: Completed 10/02/2020. Results reflect: Bone density results: OSTEOPENIA. Repeat every 2 years.  Lung Cancer Screening: (Low Dose CT Chest recommended if Age 50-80 years, 30 pack-year currently smoking OR have quit w/in 15years.) does not qualify.     Additional Screening:  Hepatitis C Screening: Completed 04/13/2017  Vision Screening: Recommended annual ophthalmology exams for early detection of glaucoma and other disorders of the eye. Is the patient up to date with their annual eye exam?  {YES/NO:21197} Who is the provider or what is the name of the office in which the patient attends annual eye exams? *** If pt is not established with a provider, would they like to be referred to a provider to establish care? {YES/NO:21197}.   Dental Screening: Recommended annual dental exams for proper oral hygiene  Community Resource Referral / Chronic Care Management: CRR required this visit?  {YES/NO:21197}  CCM required this visit?  {YES/NO:21197}     Plan:     I have personally reviewed and noted the following in the patient's chart:   Medical and social history Use of alcohol, tobacco or illicit drugs  Current medications and supplements including opioid  prescriptions. {Opioid [phone]} Functional ability and status Nutritional status Physical activity Advanced directives List of other physicians Hospitalizations, surgeries, and ER visits in previous 12 months Vitals Screenings to include cognitive, depression, and falls Referrals and  appointments  In addition, I have reviewed and discussed with patient certain preventive protocols, quality metrics, and best practice recommendations. A written personalized care plan for preventive services as well as general preventive health recommendations were provided to patient.   Due to this being a telephonic visit, the after visit summary with patients personalized plan was offered to patient via mail or my-chart. ***Patient declined at this time./ Patient would like to access on my-chart/ per request, patient was mailed a copy of AVS./ Patient preferred to pick up at office at next visit.   Marta Antu, LPN   [id number]  Nurse Health Advisor  Nurse Notes: ***

## 2021-06-24 ENCOUNTER — Ambulatory Visit (INDEPENDENT_AMBULATORY_CARE_PROVIDER_SITE_OTHER): Payer: Medicare Other

## 2021-06-24 VITALS — Ht 62.0 in | Wt 147.0 lb

## 2021-06-24 DIAGNOSIS — Z0000 Encounter for general adult medical examination without abnormal findings: Secondary | ICD-10-CM

## 2021-06-24 NOTE — Progress Notes (Signed)
Subjective:   Susan Terrell is a 65 y.o. female who presents for an Initial Medicare Annual Wellness Visit.  I connected with Taylie today by telephone and verified that I am speaking with the correct person using two identifiers. Location patient: home Location provider: work Persons participating in the virtual visit: patient, Engineer, civil (consulting).    I discussed the limitations, risks, security and privacy concerns of performing an evaluation and management service by telephone and the availability of in person appointments. I also discussed with the patient that there may be a patient responsible charge related to this service. The patient expressed understanding and verbally consented to this telephonic visit.    Interactive audio and video telecommunications were attempted between this provider and patient, however failed, due to patient having technical difficulties OR patient did not have access to video capability.  We continued and completed visit with audio only.  Some vital signs may be absent or patient reported.   Time Spent with patient on telephone encounter: 25 minutes   Review of Systems     Cardiac Risk Factors include: advanced age (>58men, >74 women);dyslipidemia;hypertension     Objective:    Today's Vitals   06/24/21 1022 06/24/21 1023  Weight: 147 lb (66.7 kg)   Height: 5\' 2"  (1.575 m)   PainSc:  2    Body mass index is 26.89 kg/m.  Advanced Directives 06/24/2021  Does Patient Have a Medical Advance Directive? No  Does patient want to make changes to medical advance directive? Yes (MAU/Ambulatory/Procedural Areas - Information given)    Current Medications (verified) Outpatient Encounter Medications as of 06/24/2021  Medication Sig   levothyroxine (SYNTHROID) 50 MCG tablet Take 1 tablet (50 mcg total) by mouth daily before breakfast.   Misc Natural Products (ELDERBERRY ZINC/VIT C/IMMUNE MT) Elderberry Zinc Vit C   Vitamin D, Ergocalciferol, (DRISDOL) 1.25 MG  (50000 UNIT) CAPS capsule Take 1 capsule (50,000 Units total) by mouth every 7 (seven) days.   estradiol (ESTRACE) 0.1 MG/GM vaginal cream estradiol 0.01% (0.1 mg/gram) vaginal cream (Patient not taking: No sig reported)   tiZANidine (ZANAFLEX) 2 MG tablet Take 0.5-2 tablets (1-4 mg total) by mouth every 6 (six) hours as needed for muscle spasms. (Patient not taking: No sig reported)   No facility-administered encounter medications on file as of 06/24/2021.    Allergies (verified) Bee venom, Codeine, Iodine, and Macrodantin   History: Past Medical History:  Diagnosis Date   Anal fissure    Asthma    childhood?   Cervical cancer screening 09/01/2015   Chicken pox as a child   Elevated BP 11/10/2013   Headache 03/01/2015   Hyperlipidemia 04/13/2017   Hypothyroidism 09/06/2015   IBS (irritable bowel syndrome) 2010   Measles as a child   Mumps as a child   Osteopenia 2013   Pneumonia    Preventative health care 09/01/2015   UTI (lower urinary tract infection)    Vitamin D deficiency 02/20/2015   Past Surgical History:  Procedure Laterality Date   ABDOMINAL HYSTERECTOMY     BREAST SURGERY     left needle biopsy   INCISE AND DRAIN ABCESS     right groin   PILONIDAL CYST EXCISION     Family History  Problem Relation Age of Onset   Cancer Father        pancreatic    Heart attack Brother    Cancer Mother        colon   Hyperlipidemia Mother  Glaucoma Mother    Thyroid disease Mother    Hypertension Mother    Cirrhosis Mother    Kidney disease Mother    Cataracts Sister    Asthma Daughter    Hypertension Daughter    Cataracts Maternal Grandmother    Alcohol abuse Maternal Grandfather    Hyperlipidemia Sister    Glaucoma Sister    Arthritis Sister        rheumatoid   Glaucoma Brother    Hyperlipidemia Brother    Hypertension Brother    Other Brother        carotid artery disease   Colitis Daughter    Hyperlipidemia Daughter    Asthma Daughter    Social History    Socioeconomic History   Marital status: Widowed    Spouse name: Not on file   Number of children: Not on file   Years of education: Not on file   Highest education level: Not on file  Occupational History   Not on file  Tobacco Use   Smoking status: Never   Smokeless tobacco: Never  Substance and Sexual Activity   Alcohol use: No   Drug use: No   Sexual activity: Yes    Birth control/protection: Surgical    Comment: lives with husband and daughter, no dietary restrictions.  Other Topics Concern   Not on file  Social History Narrative   Not on file   Social Determinants of Health   Financial Resource Strain: Low Risk    Difficulty of Paying Living Expenses: Not hard at all  Food Insecurity: No Food Insecurity   Worried About Charity fundraiser in the Last Year: Never true   [place] in the Last Year: Never true  Transportation Needs: No Transportation Needs   Lack of Transportation (Medical): No   Lack of Transportation (Non-Medical): No  Physical Activity: Inactive   Days of Exercise per Week: 0 days   Minutes of Exercise per Session: 0 min  Stress: No Stress Concern Present   Feeling of Stress : Not at all  Social Connections: Moderately Integrated   Frequency of Communication with Friends and Family: More than three times a week   Frequency of Social Gatherings with Friends and Family: More than three times a week   Attends Religious Services: More than 4 times per year   Active Member of [employer] or Organizations: Yes   Attends Archivist Meetings: More than 4 times per year   Marital Status: Widowed    Tobacco Counseling Counseling given: Not Answered   Clinical Intake:  Pre-visit preparation completed: Yes  Pain : 0-10 Pain Score: 2  Pain Type: Acute pain Pain Location: Throat (and headache) Pain Onset: In the past 7 days Pain Frequency: Constant     BMI - recorded: 26.89 Nutritional Status: BMI 25 -29 Overweight Nutritional  Risks: None Diabetes: No  How often do you need to have someone help you when you read instructions, pamphlets, or other written materials from your doctor or pharmacy?: 1 - Never  Diabetic?No  Interpreter Needed?: No  Information entered by :: Caroleen Hamman LPN   Activities of Daily Living In your present state of health, do you have any difficulty performing the following activities: 06/24/2021 10/08/2020  Hearing? N N  Vision? N N  Difficulty concentrating or making decisions? N N  Walking or climbing stairs? N N  Dressing or bathing? N N  Doing errands, shopping? N N  Preparing Food and eating ?  N -  Using the Toilet? N -  In the past six months, have you accidently leaked urine? Y -  Comment occasionally -  Do you have problems with loss of bowel control? N -  Managing your Medications? N -  Managing your Finances? N -  Housekeeping or managing your Housekeeping? N -  Some recent data might be hidden    Patient Care Team: Mosie Lukes, MD as PCP - General (Family Medicine)  Indicate any recent Medical Services you may have received from other than [facility] providers in the past year (date may be approximate).     Assessment:   This is a routine wellness examination for Estrelita.  Hearing/Vision screen Hearing Screening - Comments:: No issues Vision Screening - Comments:: Last eye exam-10/2020-[facility]  Dietary issues and exercise activities discussed: Current Exercise Habits: The patient does not participate in regular exercise at present, Exercise limited by: None identified   Goals Addressed             This Visit's Progress    Patient Stated       Increase activity       Depression Screen PHQ 2/9 Scores 06/24/2021 02/10/2021 10/08/2020 09/30/2019 05/21/2018 05/18/2017 06/29/2016  PHQ - 2 Score 0 0 0 0 0 0 0  PHQ- 9 Score - - 1 - - - -    Fall Risk Fall Risk  06/24/2021 02/10/2021 10/08/2020 09/30/2019 05/21/2018  Falls in the past year? 1 0  1 0 No  Number falls in past yr: 0 - 0 0 -  Injury with Fall? 0 - 0 0 -  Risk for fall due to : History of fall(s) - - - -  Follow up Falls prevention discussed - - - -    FALL RISK PREVENTION PERTAINING TO THE HOME:  Any stairs in or around the home? No  Home free of loose throw rugs in walkways, pet beds, electrical cords, etc? Yes  Adequate lighting in your home to reduce risk of falls? Yes   ASSISTIVE DEVICES UTILIZED TO PREVENT FALLS:  Life alert? No  Use of a cane, walker or w/c? No  Grab bars in the bathroom? Yes  Shower chair or bench in shower? Yes  Elevated toilet seat or a handicapped toilet? Yes   TIMED UP AND GO:  Was the test performed? No . Phone visit   Cognitive Function:Normal cognitive status assessed by this Nurse Health Advisor. No abnormalities found.          Immunizations Immunization History  Administered Date(s) Administered   PFIZER(Purple Top)SARS-COV-2 Vaccination 11/01/2019, 11/27/2019   Tdap 08/15/2010    TDAP status: Due, Education has been provided regarding the importance of this vaccine. Advised may receive this vaccine at local pharmacy or Health Dept. Aware to provide a copy of the vaccination record if obtained from local pharmacy or Health Dept. Verbalized acceptance and understanding.  Flu Vaccine status: Due, Education has been provided regarding the importance of this vaccine. Advised may receive this vaccine at local pharmacy or Health Dept. Aware to provide a copy of the vaccination record if obtained from local pharmacy or Health Dept. Verbalized acceptance and understanding.  Pneumococcal vaccine status: Due, Education has been provided regarding the importance of this vaccine. Advised may receive this vaccine at local pharmacy or Health Dept. Aware to provide a copy of the vaccination record if obtained from local pharmacy or Health Dept. Verbalized acceptance and understanding.  Covid-19 vaccine status: Information provided  on  how to obtain vaccines.   Qualifies for Shingles Vaccine? Yes   Zostavax completed No   Shingrix Completed?: No.    Education has been provided regarding the importance of this vaccine. Patient has been advised to call insurance company to determine out of pocket expense if they have not yet received this vaccine. Advised may also receive vaccine at local pharmacy or Health Dept. Verbalized acceptance and understanding.  Screening Tests Health Maintenance  Topic Date Due   Pneumonia Vaccine 57+ Years old (1 - PCV) Never done   COLONOSCOPY (Pts 45-37yrs Insurance coverage will need to be confirmed)  Never done   Zoster Vaccines- Shingrix (1 of 2) Never done   INFLUENZA VACCINE  03/15/2021   TETANUS/TDAP  09/12/2021   MAMMOGRAM  10/02/2022   DEXA SCAN  10/02/2022   Hepatitis C Screening  Completed   HPV VACCINES  Aged Out   COVID-19 Vaccine  Discontinued    Health Maintenance  Health Maintenance Due  Topic Date Due   Pneumonia Vaccine 29+ Years old (1 - PCV) Never done   COLONOSCOPY (Pts 45-36yrs Insurance coverage will need to be confirmed)  Never done   Zoster Vaccines- Shingrix (1 of 2) Never done   INFLUENZA VACCINE  03/15/2021    Colorectal cancer screening: Declined today  Mammogram status: Completed bilateral 10/02/2020. Repeat every year  Bone Density status: Completed 10/02/2020. Results reflect: Bone density results: OSTEOPENIA. Repeat every 2 years.  Lung Cancer Screening: (Low Dose CT Chest recommended if Age 62-80 years, 30 pack-year currently smoking OR have quit w/in 15years.) does not qualify.     Additional Screening:  Hepatitis C Screening: Completed 04/13/2017  Vision Screening: Recommended annual ophthalmology exams for early detection of glaucoma and other disorders of the eye. Is the patient up to date with their annual eye exam?  Yes  Who is the provider or what is the name of the office in which the patient attends annual eye exams? [facility]   Dental Screening: Recommended annual dental exams for proper oral hygiene  Community Resource Referral / Chronic Care Management: CRR required this visit?  No   CCM required this visit?  No      Plan:     I have personally reviewed and noted the following in the patient's chart:   Medical and social history Use of alcohol, tobacco or illicit drugs  Current medications and supplements including opioid prescriptions. Patient is not currently taking opioid prescriptions. Functional ability and status Nutritional status Physical activity Advanced directives List of other physicians Hospitalizations, surgeries, and ER visits in previous 12 months Vitals Screenings to include cognitive, depression, and falls Referrals and appointments  In addition, I have reviewed and discussed with patient certain preventive protocols, quality metrics, and best practice recommendations. A written personalized care plan for preventive services as well as general preventive health recommendations were provided to patient.   Due to this being a telephonic visit, the after visit summary with patients personalized plan was offered to patient via mail or my-chart. Patient would like to access on my-chart.   Marta Antu, LPN   [id number]  Nurse Health Advisor  Nurse Notes: None

## 2021-06-24 NOTE — Telephone Encounter (Signed)
Patient was informed  of Dr. Leta Jungling recommendations, she stated she understood and would try those.

## 2021-06-24 NOTE — Patient Instructions (Signed)
Susan Terrell , Thank you for taking time to complete your Medicare Wellness Visit. I appreciate your ongoing commitment to your health goals. Please review the following plan we discussed and let me know if I can assist you in the future.   Screening recommendations/referrals: Colonoscopy: Declined today. Please call the office when you are readt to schedule. Mammogram:  Completed bilateral 10/02/2020. Repeat every year Bone Density: Completed 10/02/2020.  Repeat every 2 years. Recommended yearly ophthalmology/optometry visit for glaucoma screening and checkup Recommended yearly dental visit for hygiene and checkup  Vaccinations: Influenza vaccine: Due-May obtain vaccine at our office or your local pharmacy. Pneumococcal vaccine: Due-May obtain vaccine at our office or your local pharmacy. Tdap vaccine: Discuss with pharmacy Shingles vaccine: Discuss with pharmacy   Covid-19:Booster available at the pharmacy  Advanced directives: Information mailed  Conditions/risks identified: See problem list  Next appointment: Follow up in one year for your annual wellness visit 06/30/2022 @ 10:20   Preventive Care 65 Years and Older, Female Preventive care refers to lifestyle choices and visits with your health care provider that can promote health and wellness. What does preventive care include? A yearly physical exam. This is also called an annual well check. Dental exams once or twice a year. Routine eye exams. Ask your health care provider how often you should have your eyes checked. Personal lifestyle choices, including: Daily care of your teeth and gums. Regular physical activity. Eating a healthy diet. Avoiding tobacco and drug use. Limiting alcohol use. Practicing safe sex. Taking low-dose aspirin every day. Taking vitamin and mineral supplements as recommended by your health care provider. What happens during an annual well check? The services and screenings done by your health care  provider during your annual well check will depend on your age, overall health, lifestyle risk factors, and family history of disease. Counseling  Your health care provider may ask you questions about your: Alcohol use. Tobacco use. Drug use. Emotional well-being. Home and relationship well-being. Sexual activity. Eating habits. History of falls. Memory and ability to understand (cognition). Work and work Astronomer. Reproductive health. Screening  You may have the following tests or measurements: Height, weight, and BMI. Blood pressure. Lipid and cholesterol levels. These may be checked every 5 years, or more frequently if you are over 33 years old. Skin check. Lung cancer screening. You may have this screening every year starting at age 59 if you have a 30-pack-year history of smoking and currently smoke or have quit within the past 15 years. Fecal occult blood test (FOBT) of the stool. You may have this test every year starting at age 33. Flexible sigmoidoscopy or colonoscopy. You may have a sigmoidoscopy every 5 years or a colonoscopy every 10 years starting at age 66. Hepatitis C blood test. Hepatitis B blood test. Sexually transmitted disease (STD) testing. Diabetes screening. This is done by checking your blood sugar (glucose) after you have not eaten for a while (fasting). You may have this done every 1-3 years. Bone density scan. This is done to screen for osteoporosis. You may have this done starting at age 91. Mammogram. This may be done every 1-2 years. Talk to your health care provider about how often you should have regular mammograms. Talk with your health care provider about your test results, treatment options, and if necessary, the need for more tests. Vaccines  Your health care provider may recommend certain vaccines, such as: Influenza vaccine. This is recommended every year. Tetanus, diphtheria, and acellular pertussis (Tdap, Td) vaccine.  You may need a Td  booster every 10 years. Zoster vaccine. You may need this after age 77. Pneumococcal 13-valent conjugate (PCV13) vaccine. One dose is recommended after age 8. Pneumococcal polysaccharide (PPSV23) vaccine. One dose is recommended after age 50. Talk to your health care provider about which screenings and vaccines you need and how often you need them. This information is not intended to replace advice given to you by your health care provider. Make sure you discuss any questions you have with your health care provider. Document Released: 08/28/2015 Document Revised: 04/20/2016 Document Reviewed: 06/02/2015 Elsevier Interactive Patient Education  2017 [place] Prevention in the Home Falls can cause injuries. They can happen to people of all ages. There are many things you can do to make your home safe and to help prevent falls. What can I do on the outside of my home? Regularly fix the edges of walkways and driveways and fix any cracks. Remove anything that might make you trip as you walk through a door, such as a raised step or threshold. Trim any bushes or trees on the path to your home. Use bright outdoor lighting. Clear any walking paths of anything that might make someone trip, such as rocks or tools. Regularly check to see if handrails are loose or broken. Make sure that both sides of any steps have handrails. Any raised decks and porches should have guardrails on the edges. Have any leaves, snow, or ice cleared regularly. Use sand or salt on walking paths during winter. Clean up any spills in your garage right away. This includes oil or grease spills. What can I do in the bathroom? Use night lights. Install grab bars by the toilet and in the tub and shower. Do not use towel bars as grab bars. Use non-skid mats or decals in the tub or shower. If you need to sit down in the shower, use a plastic, non-slip stool. Keep the floor dry. Clean up any water that spills on the floor  as soon as it happens. Remove soap buildup in the tub or shower regularly. Attach bath mats securely with double-sided non-slip rug tape. Do not have throw rugs and other things on the floor that can make you trip. What can I do in the bedroom? Use night lights. Make sure that you have a light by your bed that is easy to reach. Do not use any sheets or blankets that are too big for your bed. They should not hang down onto the floor. Have a firm chair that has side arms. You can use this for support while you get dressed. Do not have throw rugs and other things on the floor that can make you trip. What can I do in the kitchen? Clean up any spills right away. Avoid walking on wet floors. Keep items that you use a lot in easy-to-reach places. If you need to reach something above you, use a strong step stool that has a grab bar. Keep electrical cords out of the way. Do not use floor polish or wax that makes floors slippery. If you must use wax, use non-skid floor wax. Do not have throw rugs and other things on the floor that can make you trip. What can I do with my stairs? Do not leave any items on the stairs. Make sure that there are handrails on both sides of the stairs and use them. Fix handrails that are broken or loose. Make sure that handrails are as long as  the stairways. Check any carpeting to make sure that it is firmly attached to the stairs. Fix any carpet that is loose or worn. Avoid having throw rugs at the top or bottom of the stairs. If you do have throw rugs, attach them to the floor with carpet tape. Make sure that you have a light switch at the top of the stairs and the bottom of the stairs. If you do not have them, ask someone to add them for you. What else can I do to help prevent falls? Wear shoes that: Do not have high heels. Have rubber bottoms. Are comfortable and fit you well. Are closed at the toe. Do not wear sandals. If you use a stepladder: Make sure that it is  fully opened. Do not climb a closed stepladder. Make sure that both sides of the stepladder are locked into place. Ask someone to hold it for you, if possible. Clearly mark and make sure that you can see: Any grab bars or handrails. First and last steps. Where the edge of each step is. Use tools that help you move around (mobility aids) if they are needed. These include: Canes. Walkers. Scooters. Crutches. Turn on the lights when you go into a dark area. Replace any light bulbs as soon as they burn out. Set up your furniture so you have a clear path. Avoid moving your furniture around. If any of your floors are uneven, fix them. If there are any pets around you, be aware of where they are. Review your medicines with your doctor. Some medicines can make you feel dizzy. This can increase your chance of falling. Ask your doctor what other things that you can do to help prevent falls. This information is not intended to replace advice given to you by your health care provider. Make sure you discuss any questions you have with your health care provider. Document Released: 05/28/2009 Document Revised: 01/07/2016 Document Reviewed: 09/05/2014 Elsevier Interactive Patient Education  2017 [employer].

## 2021-06-24 NOTE — Telephone Encounter (Signed)
Lvm to call back

## 2021-07-07 ENCOUNTER — Other Ambulatory Visit: Payer: Self-pay | Admitting: Family Medicine

## 2021-10-04 DIAGNOSIS — Z1231 Encounter for screening mammogram for malignant neoplasm of breast: Secondary | ICD-10-CM | POA: Diagnosis not present

## 2021-10-04 LAB — HM MAMMOGRAPHY

## 2021-10-05 ENCOUNTER — Encounter: Payer: Self-pay | Admitting: *Deleted

## 2021-10-06 DIAGNOSIS — L821 Other seborrheic keratosis: Secondary | ICD-10-CM | POA: Diagnosis not present

## 2021-10-06 DIAGNOSIS — L570 Actinic keratosis: Secondary | ICD-10-CM | POA: Diagnosis not present

## 2021-10-06 DIAGNOSIS — L820 Inflamed seborrheic keratosis: Secondary | ICD-10-CM | POA: Diagnosis not present

## 2021-10-06 DIAGNOSIS — D2271 Melanocytic nevi of right lower limb, including hip: Secondary | ICD-10-CM | POA: Diagnosis not present

## 2021-10-14 ENCOUNTER — Ambulatory Visit: Payer: Medicare Other | Admitting: Family Medicine

## 2021-10-29 DIAGNOSIS — H524 Presbyopia: Secondary | ICD-10-CM | POA: Diagnosis not present

## 2022-01-05 ENCOUNTER — Other Ambulatory Visit: Payer: Self-pay

## 2022-01-05 MED ORDER — LEVOTHYROXINE SODIUM 50 MCG PO TABS: ORAL_TABLET | ORAL | 0 refills | Status: DC

## 2022-01-26 IMAGING — DX DG CLAVICLE*R*
2 series · 2 of 2 positions shown · non-contrast
Comparison: Right shoulder radiographs 09/16/2014.

CLINICAL DATA: Prominence over the right sternoclavicular joint for
2 weeks. No known injury.

EXAM:
RIGHT CLAVICLE - 2+ VIEWS

[clavicle ap]
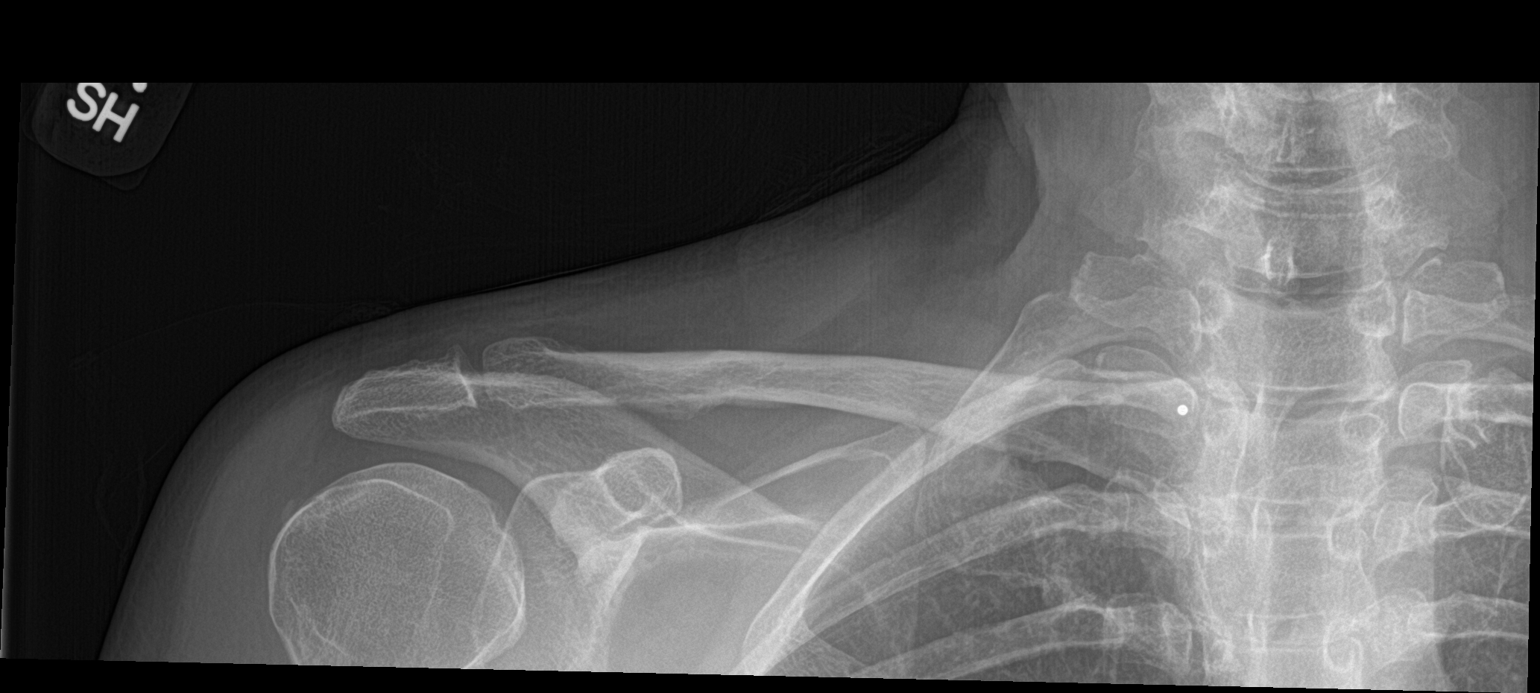

[clavicle axial]
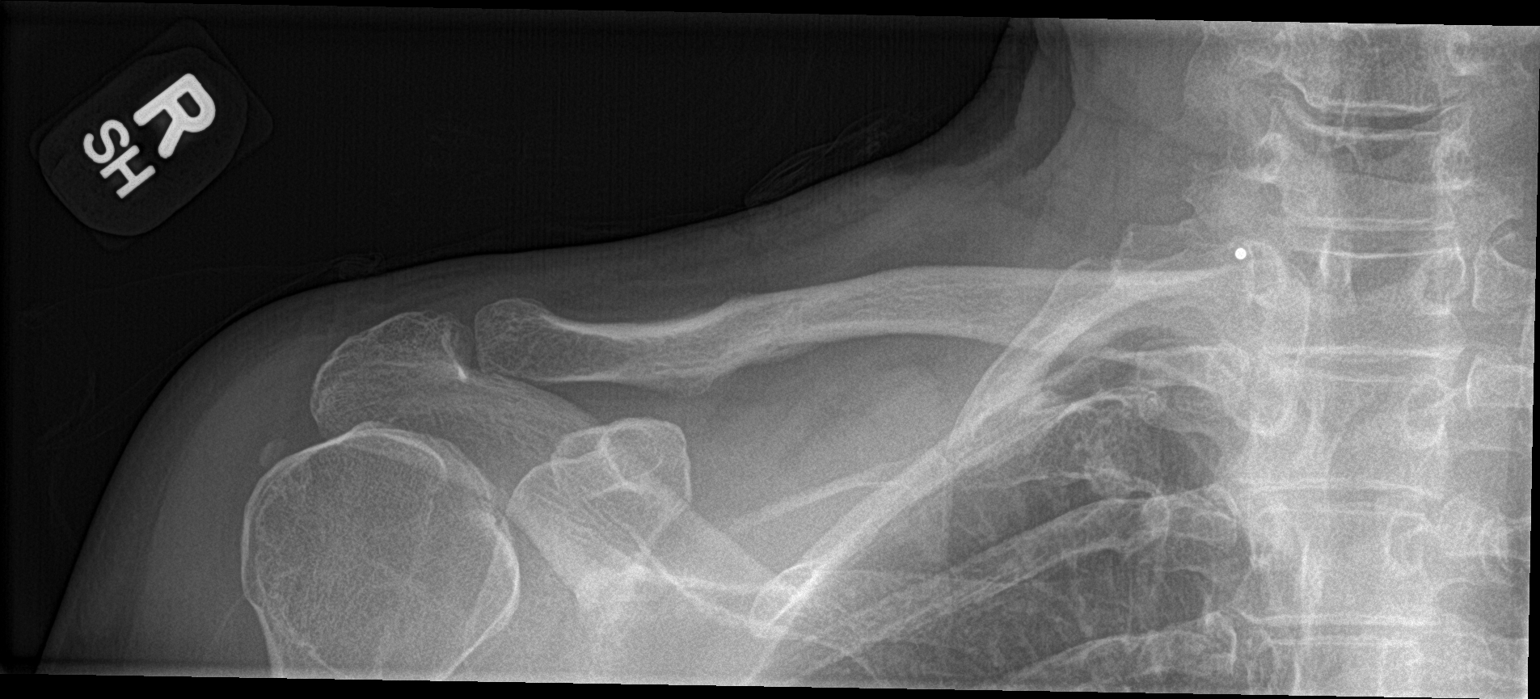

[2 of 2 positions shown; findings below may reference images not displayed]

FINDINGS: Two views were obtained with a marker over the right clavicular
head. No evidence of acute fracture or dislocation. No erosive
changes of the clavicular head are demonstrated. There is no
significant acromioclavicular joint arthropathy. Calcification is
again noted adjacent to the greater tuberosity, likely reflecting
chronic calcific tendinitis.
IMPRESSION: No acute osseous findings or significant sternoclavicular
arthropathy identified. Clinical follow up of any palpable concern
recommended.

## 2022-02-28 NOTE — Progress Notes (Unsigned)
MyChart Video Visit    Virtual Visit via Video Note   This visit type was conducted due to national recommendations for restrictions regarding the COVID-19 Pandemic (e.g. social distancing) in an effort to limit this patient's exposure and mitigate transmission in our community. This patient is at least at moderate risk for complications without adequate follow up. This format is felt to be most appropriate for this patient at this time. Physical exam was limited by quality of the video and audio technology used for the visit. Burt Ek., CMA was able to get the patient set up on a video visit.  Patient location: Home Patient and provider in visit Provider location: Office  I discussed the limitations of evaluation and management by telemedicine and the availability of in person appointments. The patient expressed understanding and agreed to proceed.  Visit Date: 03/01/2022  Today's healthcare provider: Danise Edge, MD     Subjective:    Patient ID: Susan Terrell, female    DOB: 1955-09-04, 66 y.o.   MRN: [id number]  No chief complaint on file.   HPI Patient is in today for a follow up.  Past Medical History:  Diagnosis Date   Anal fissure    Asthma    childhood?   Cervical cancer screening 09/01/2015   Chicken pox as a child   Elevated BP 11/10/2013   Headache 03/01/2015   Hyperlipidemia 04/13/2017   Hypothyroidism 09/06/2015   IBS (irritable bowel syndrome) 2010   Measles as a child   Mumps as a child   Osteopenia 2013   Pneumonia    Preventative health care 09/01/2015   UTI (lower urinary tract infection)    Vitamin D deficiency 02/20/2015    Past Surgical History:  Procedure Laterality Date   ABDOMINAL HYSTERECTOMY     BREAST SURGERY     left needle biopsy   INCISE AND DRAIN ABCESS     right groin   PILONIDAL CYST EXCISION      Family History  Problem Relation Age of Onset   Cancer Father        pancreatic    Heart attack Brother    Cancer Mother         colon   Hyperlipidemia Mother    Glaucoma Mother    Thyroid disease Mother    Hypertension Mother    Cirrhosis Mother    Kidney disease Mother    Cataracts Sister    Asthma Daughter    Hypertension Daughter    Cataracts Maternal Grandmother    Alcohol abuse Maternal Grandfather    Hyperlipidemia Sister    Glaucoma Sister    Arthritis Sister        rheumatoid   Glaucoma Brother    Hyperlipidemia Brother    Hypertension Brother    Other Brother        carotid artery disease   Colitis Daughter    Hyperlipidemia Daughter    Asthma Daughter     Social History   Socioeconomic History   Marital status: Widowed    Spouse name: Not on file   Number of children: Not on file   Years of education: Not on file   Highest education level: Not on file  Occupational History   Not on file  Tobacco Use   Smoking status: Never   Smokeless tobacco: Never  Substance and Sexual Activity   Alcohol use: No   Drug use: No   Sexual activity: Yes    Birth control/protection:  Surgical    Comment: lives with husband and daughter, no dietary restrictions.  Other Topics Concern   Not on file  Social History Narrative   Not on file   Social Determinants of Health   Financial Resource Strain: Low Risk  (06/24/2021)   Overall Financial Resource Strain (CARDIA)    Difficulty of Paying Living Expenses: Not hard at all  Food Insecurity: No Food Insecurity (06/24/2021)   Hunger Vital Sign    Worried About Running Out of Food in the Last Year: Never true    Ran Out of Food in the Last Year: Never true  Transportation Needs: No Transportation Needs (06/24/2021)   PRAPARE - Administrator, Civil Service (Medical): No    Lack of Transportation (Non-Medical): No  Physical Activity: Inactive (06/24/2021)   Exercise Vital Sign    Days of Exercise per Week: 0 days    Minutes of Exercise per Session: 0 min  Stress: No Stress Concern Present (06/24/2021)   [employer] of  Occupational Health - Occupational Stress Questionnaire    Feeling of Stress : Not at all  Social Connections: Moderately Integrated (06/24/2021)   Social Connection and Isolation Panel [NHANES]    Frequency of Communication with Friends and Family: More than three times a week    Frequency of Social Gatherings with Friends and Family: More than three times a week    Attends Religious Services: More than 4 times per year    Active Member of [employer] or Organizations: Yes    Attends Banker Meetings: More than 4 times per year    Marital Status: Widowed  Intimate Partner Violence: Not At Risk (06/24/2021)   Humiliation, Afraid, Rape, and Kick questionnaire    Fear of Current or Ex-Partner: No    Emotionally Abused: No    Physically Abused: No    Sexually Abused: No    Outpatient Medications Prior to Visit  Medication Sig Dispense Refill   estradiol (ESTRACE) 0.1 MG/GM vaginal cream estradiol 0.01% (0.1 mg/gram) vaginal cream (Patient not taking: No sig reported)     levothyroxine (SYNTHROID) 50 MCG tablet TAKE 1 TABLET(50 MCG) BY MOUTH DAILY BEFORE BREAKFAST 90 tablet 0   Misc Natural Products (ELDERBERRY ZINC/VIT C/IMMUNE MT) Elderberry Zinc Vit C     tiZANidine (ZANAFLEX) 2 MG tablet Take 0.5-2 tablets (1-4 mg total) by mouth every 6 (six) hours as needed for muscle spasms. (Patient not taking: No sig reported) 30 tablet 1   Vitamin D, Ergocalciferol, (DRISDOL) 1.25 MG (50000 UNIT) CAPS capsule Take 1 capsule (50,000 Units total) by mouth every 7 (seven) days. 4 capsule 4   No facility-administered medications prior to visit.    Allergies  Allergen Reactions   Bee Venom     Swelling localized   Codeine    Iodine    Macrodantin     ROS     Objective:    Physical Exam  There were no vitals taken for this visit. Wt Readings from Last 3 Encounters:  06/24/21 147 lb (66.7 kg)  12/29/20 147 lb (66.7 kg)  10/08/20 149 lb 3.2 oz (67.7 kg)    Diabetic Foot Exam -  Simple   No data filed    Lab Results  Component Value Date   WBC 5.7 10/01/2020   HGB 13.9 10/01/2020   HCT 42.3 10/01/2020   PLT 229.0 10/01/2020   GLUCOSE 76 04/12/2021   CHOL 175 10/01/2020   TRIG 85.0 10/01/2020   HDL  51.40 10/01/2020   LDLCALC 106 (H) 10/01/2020   ALT 16 04/12/2021   AST 22 04/12/2021   NA 140 04/12/2021   K 4.5 04/12/2021   CL 105 04/12/2021   CREATININE 0.68 04/12/2021   BUN 19 04/12/2021   CO2 27 04/12/2021   TSH 3.48 01/06/2021   HGBA1C 5.7 10/01/2020    Lab Results  Component Value Date   TSH 3.48 01/06/2021   Lab Results  Component Value Date   WBC 5.7 10/01/2020   HGB 13.9 10/01/2020   HCT 42.3 10/01/2020   MCV 89.7 10/01/2020   PLT 229.0 10/01/2020   Lab Results  Component Value Date   NA 140 04/12/2021   K 4.5 04/12/2021   CO2 27 04/12/2021   GLUCOSE 76 04/12/2021   BUN 19 04/12/2021   CREATININE 0.68 04/12/2021   BILITOT 0.4 04/12/2021   ALKPHOS 75 04/12/2021   AST 22 04/12/2021   ALT 16 04/12/2021   PROT 6.7 04/12/2021   ALBUMIN 3.9 04/12/2021   CALCIUM 8.9 04/12/2021   GFR 90.31 04/12/2021   Lab Results  Component Value Date   CHOL 175 10/01/2020   Lab Results  Component Value Date   HDL 51.40 10/01/2020   Lab Results  Component Value Date   LDLCALC 106 (H) 10/01/2020   Lab Results  Component Value Date   TRIG 85.0 10/01/2020   Lab Results  Component Value Date   CHOLHDL 3 10/01/2020   Lab Results  Component Value Date   HGBA1C 5.7 10/01/2020       Assessment & Plan:   Problem List Items Addressed This Visit   None   I am having Earle T. Arvelo maintain her estradiol, tiZANidine, Vitamin D (Ergocalciferol), Misc Natural Products (ELDERBERRY ZINC/VIT C/IMMUNE MT), and levothyroxine.  No orders of the defined types were placed in this encounter.   I discussed the assessment and treatment plan with the patient. The patient was provided an opportunity to ask questions and all were answered. The  patient agreed with the plan and demonstrated an understanding of the instructions.   The patient was advised to call back or seek an in-person evaluation if the symptoms worsen or if the condition fails to improve as anticipated.     Penni Homans, MD [facility] [phone] (phone) [phone] (fax)  [place]

## 2022-03-01 ENCOUNTER — Encounter: Payer: Self-pay | Admitting: Family Medicine

## 2022-03-01 ENCOUNTER — Telehealth (INDEPENDENT_AMBULATORY_CARE_PROVIDER_SITE_OTHER): Payer: Medicare Other | Admitting: Family Medicine

## 2022-03-01 DIAGNOSIS — E559 Vitamin D deficiency, unspecified: Secondary | ICD-10-CM

## 2022-03-01 DIAGNOSIS — R519 Headache, unspecified: Secondary | ICD-10-CM | POA: Diagnosis not present

## 2022-03-01 DIAGNOSIS — E785 Hyperlipidemia, unspecified: Secondary | ICD-10-CM | POA: Diagnosis not present

## 2022-03-01 DIAGNOSIS — Z7189 Other specified counseling: Secondary | ICD-10-CM | POA: Insufficient documentation

## 2022-03-01 DIAGNOSIS — E039 Hypothyroidism, unspecified: Secondary | ICD-10-CM

## 2022-03-01 DIAGNOSIS — J45909 Unspecified asthma, uncomplicated: Secondary | ICD-10-CM

## 2022-03-01 NOTE — Assessment & Plan Note (Signed)
No complaints with pulmonary system

## 2022-03-01 NOTE — Progress Notes (Unsigned)
Called pt x2, went to VM.

## 2022-03-01 NOTE — Assessment & Plan Note (Signed)
Her mother died in March of 2023 and they are still dealing with the estate with some stress amongst the siblings so she is grieving. She also has a brother who has just been placed in Hospice for end stage bile duct cancer and she lost her husband this time of year a few years back. Overall she feels she is managing with family support no changes at this time.

## 2022-03-01 NOTE — Assessment & Plan Note (Signed)
On Levothyroxine, continue to monitor 

## 2022-03-01 NOTE — Patient Instructions (Addendum)
Yerba Matte tea do not drink  Managing Loss, Adult People experience loss in many different ways throughout their lives. Events such as moving, changing jobs, and losing friends can create a sense of loss. The loss may be as serious as a major health change, divorce, death of a pet, or death of a loved one. All of these types of loss are likely to create a physical and emotional reaction known as grief. Grief is the result of a major change or an absence of something or someone that you count on. Grief is a normal reaction to loss. A variety of factors can affect your grieving experience, including: The nature of your loss. Your relationship to what or whom you lost. Your understanding of grief and how to manage it. Your support system. Be aware that when grief becomes extreme, it can lead to more severe issues like isolation, depression, anxiety, or suicidal thoughts. Talk with your health care provider if you have any of these issues. How to manage lifestyle changes Keep to your normal routine as much as possible. If you have trouble focusing or doing normal activities, it is acceptable to take some time away from your normal routine. Spend time with friends and loved ones. Eat a healthy diet, get plenty of sleep, and rest when you feel tired. How to recognize changes  The way that you deal with your grief will affect your ability to function as you normally do. When grieving, you may experience these changes: Numbness, shock, sadness, anxiety, anger, denial, and guilt. Thoughts about death. Unexpected crying. A physical sensation of emptiness in your stomach. Problems sleeping and eating. Tiredness (fatigue). Loss of interest in normal activities. Dreaming about or imagining seeing the person who died. A need to remember what or whom you lost. Difficulty thinking about anything other than your loss for a period of time. Relief. If you have been expecting the loss for a while, you may  feel a sense of relief when it happens. Follow these instructions at home: Activity Express your feelings in healthy ways, such as: Talking with others about your loss. It may be helpful to find others who have had a similar loss, such as a support group. Writing down your feelings in a journal. Doing physical activities to release stress and emotional energy. Doing creative activities like painting, sculpting, or playing or listening to music. Practicing resilience. This is the ability to recover and adjust after facing challenges. Reading some resources that encourage resilience may help you to learn ways to practice those behaviors.  General instructions Be patient with yourself and others. Allow the grieving process to happen, and remember that grieving takes time. It is likely that you may never feel completely done with some grief. You may find a way to move on while still cherishing memories and feelings about your loss. Accepting your loss is a process. It can take months or longer to adjust. Keep all follow-up visits. This is important. Where to find support To get support for managing loss: Ask your health care provider for help and recommendations, such as grief counseling or therapy. Think about joining a support group for people who are managing a loss. Where to find more information You can find more information about managing loss from: American Society of Clinical Oncology: www.cancer.net American Psychological Association: [employer] Contact a health care provider if: Your grief is extreme and keeps getting worse. You have ongoing grief that does not improve. Your body shows symptoms of grief, such  as illness. You feel depressed, anxious, or hopeless. Get help right away if: You have thoughts about hurting yourself or others. Get help right away if you feel like you may hurt yourself or others, or have thoughts about taking your own life. Go to your nearest emergency  room or: Call 911. Call the National Suicide Prevention Lifeline at 231 424 2579 or 988. This is open 24 hours a day. Text the Crisis Text Line at 680-321-5035. Summary Grief is the result of a major change or an absence of someone or something that you count on. Grief is a normal reaction to loss. The depth of grief and the period of recovery depend on the type of loss and your ability to adjust to the change and process your feelings. Processing grief requires patience and a willingness to accept your feelings and talk about your loss with people who are supportive. It is important to find resources that work for you and to realize that people experience grief differently. There is not one grieving process that works for everyone in the same way. Be aware that when grief becomes extreme, it can lead to more severe issues like isolation, depression, anxiety, or suicidal thoughts. Talk with your health care provider if you have any of these issues. This information is not intended to replace advice given to you by your health care provider. Make sure you discuss any questions you have with your health care provider. Document Revised: 03/22/2021 Document Reviewed: 03/22/2021 Elsevier Patient Education  2023 [employer].

## 2022-03-01 NOTE — Assessment & Plan Note (Signed)
Encourage heart healthy diet such as MIND or DASH diet, increase exercise, avoid trans fats, simple carbohydrates and processed foods, consider a krill or fish or flaxseed oil cap daily.  °

## 2022-03-01 NOTE — Assessment & Plan Note (Signed)
Supplement and monitor 

## 2022-04-04 ENCOUNTER — Other Ambulatory Visit: Payer: Self-pay

## 2022-04-04 MED ORDER — LEVOTHYROXINE SODIUM 50 MCG PO TABS: ORAL_TABLET | ORAL | 0 refills | Status: DC

## 2022-06-27 ENCOUNTER — Telehealth: Payer: Self-pay | Admitting: Family Medicine

## 2022-06-27 ENCOUNTER — Other Ambulatory Visit: Payer: Self-pay

## 2022-06-27 MED ORDER — LEVOTHYROXINE SODIUM 50 MCG PO TABS: ORAL_TABLET | ORAL | 0 refills | Status: DC

## 2022-06-27 NOTE — Telephone Encounter (Signed)
Medication was sent

## 2022-06-27 NOTE — Telephone Encounter (Signed)
Patient called to say she doesn't have any more refills but she wasn't sure if she needed to do more labs or another visit in order to renew her prescriptions as she is running low. Please call patient to advise. Okay to leave message.

## 2022-06-27 NOTE — Telephone Encounter (Signed)
Levothyroxine is the medication that patient is asking about.

## 2022-06-30 ENCOUNTER — Ambulatory Visit (INDEPENDENT_AMBULATORY_CARE_PROVIDER_SITE_OTHER): Payer: Medicare Other | Admitting: *Deleted

## 2022-06-30 ENCOUNTER — Ambulatory Visit: Payer: Medicare Other

## 2022-06-30 DIAGNOSIS — Z0000 Encounter for general adult medical examination without abnormal findings: Secondary | ICD-10-CM

## 2022-06-30 NOTE — Progress Notes (Signed)
Subjective:   Susan Terrell is a 66 y.o. female who presents for Medicare Annual (Subsequent) preventive examination.  I connected with  Susan Terrell on 06/30/22 by a audio enabled telemedicine application and verified that I am speaking with the correct person using two identifiers.  Patient Location: Home  Provider Location: Office/Clinic  I discussed the limitations of evaluation and management by telemedicine. The patient expressed understanding and agreed to proceed.   Review of Systems    Defer to PCP Cardiac Risk Factors include: advanced age (>90men, >90 women);dyslipidemia;hypertension     Objective:    There were no vitals filed for this visit. There is no height or weight on file to calculate BMI.     06/30/2022    1:07 PM 06/24/2021   10:27 AM  Advanced Directives  Does Patient Have a Medical Advance Directive? No No  Does patient want to make changes to medical advance directive?  Yes (MAU/Ambulatory/Procedural Areas - Information given)  Would patient like information on creating a medical advance directive? No - Patient declined     Current Medications (verified) Outpatient Encounter Medications as of 06/30/2022  Medication Sig   estradiol (ESTRACE) 0.1 MG/GM vaginal cream estradiol 0.01% (0.1 mg/gram) vaginal cream (Patient not taking: Reported on 02/10/2021)   levothyroxine (SYNTHROID) 50 MCG tablet TAKE 1 TABLET(50 MCG) BY MOUTH DAILY BEFORE BREAKFAST   Misc Natural Products (ELDERBERRY ZINC/VIT C/IMMUNE MT) Elderberry Zinc Vit C   tiZANidine (ZANAFLEX) 2 MG tablet Take 0.5-2 tablets (1-4 mg total) by mouth every 6 (six) hours as needed for muscle spasms. (Patient not taking: Reported on 02/10/2021)   Vitamin D, Ergocalciferol, (DRISDOL) 1.25 MG (50000 UNIT) CAPS capsule Take 1 capsule (50,000 Units total) by mouth every 7 (seven) days.   No facility-administered encounter medications on file as of 06/30/2022.    Allergies (verified) Bee venom, Codeine,  Iodine, and Macrodantin   History: Past Medical History:  Diagnosis Date   Anal fissure    Asthma    childhood?   Cervical cancer screening 09/01/2015   Chicken pox as a child   Elevated BP 11/10/2013   Headache 03/01/2015   Hyperlipidemia 04/13/2017   Hypothyroidism 09/06/2015   IBS (irritable bowel syndrome) 2010   Measles as a child   Mumps as a child   Osteopenia 2013   Pneumonia    Preventative health care 09/01/2015   UTI (lower urinary tract infection)    Vitamin D deficiency 02/20/2015   Past Surgical History:  Procedure Laterality Date   ABDOMINAL HYSTERECTOMY     BREAST SURGERY     left needle biopsy   INCISE AND DRAIN ABCESS     right groin   PILONIDAL CYST EXCISION     Family History  Problem Relation Age of Onset   Cancer Mother        colon   Hyperlipidemia Mother    Glaucoma Mother    Thyroid disease Mother    Hypertension Mother    Cirrhosis Mother    Kidney disease Mother    Cancer Father        pancreatic    Cataracts Sister    Hyperlipidemia Sister    Glaucoma Sister    Arthritis Sister        rheumatoid   Heart attack Brother    Cancer Brother        bile duct cancer   Glaucoma Brother    Hyperlipidemia Brother    Hypertension Brother  Other Brother        carotid artery disease   Asthma Daughter    Hypertension Daughter    Colitis Daughter    Hyperlipidemia Daughter    Asthma Daughter    Cataracts Maternal Grandmother    Alcohol abuse Maternal Grandfather    Social History   Socioeconomic History   Marital status: Widowed    Spouse name: Not on file   Number of children: Not on file   Years of education: Not on file   Highest education level: Not on file  Occupational History   Not on file  Tobacco Use   Smoking status: Never   Smokeless tobacco: Never  Substance and Sexual Activity   Alcohol use: No   Drug use: No   Sexual activity: Yes    Birth control/protection: Surgical    Comment: lives with husband and daughter,  no dietary restrictions.  Other Topics Concern   Not on file  Social History Narrative   Not on file   Social Determinants of Health   Financial Resource Strain: Low Risk  (06/24/2021)   Overall Financial Resource Strain (CARDIA)    Difficulty of Paying Living Expenses: Not hard at all  Food Insecurity: No Food Insecurity (06/24/2021)   Hunger Vital Sign    Worried About Running Out of Food in the Last Year: Never true    Ran Out of Food in the Last Year: Never true  Transportation Needs: No Transportation Needs (06/24/2021)   PRAPARE - Administrator, Civil ServiceTransportation    Lack of Transportation (Medical): No    Lack of Transportation (Non-Medical): No  Physical Activity: Inactive (06/24/2021)   Exercise Vital Sign    Days of Exercise per Week: 0 days    Minutes of Exercise per Session: 0 min  Stress: No Stress Concern Present (06/24/2021)   [employer] - Occupational Stress Questionnaire    Feeling of Stress : Not at all  Social Connections: Moderately Integrated (06/24/2021)   Social Connection and Isolation Panel [NHANES]    Frequency of Communication with Friends and Family: More than three times a week    Frequency of Social Gatherings with Friends and Family: More than three times a week    Attends Religious Services: More than 4 times per year    Active Member of [employer] or Organizations: Yes    Attends [employer]er[employer] or Organization Meetings: More than 4 times per year    Marital Status: Widowed    Tobacco Counseling Counseling given: Not Answered   Clinical Intake:  Pre-visit preparation completed: Yes  Pain : No/denies pain  Diabetes: No  How often do you need to have someone help you when you read instructions, pamphlets, or other written materials from your doctor or pharmacy?: 1 - Never   Activities of Daily Living    06/30/2022    1:09 PM  In your present state of health, do you have any difficulty performing the following activities:  Hearing? 0   Vision? 0  Difficulty concentrating or making decisions? 0  Walking or climbing stairs? 0  Dressing or bathing? 0  Doing errands, shopping? 0  Preparing Food and eating ? N  Using the Toilet? N  In the past six months, have you accidently leaked urine? Y  Comment occasionally  Do you have problems with loss of bowel control? N  Managing your Medications? N  Managing your Finances? N  Housekeeping or managing your Housekeeping? N    Patient Care Team: Danise EdgeBlyth, Stacey  A, MD as PCP - General (Family Medicine)  Indicate any recent Medical Services you may have received from other than [facility] providers in the past year (date may be approximate).     Assessment:   This is a routine wellness examination for Careen.  Hearing/Vision screen No results found.  Dietary issues and exercise activities discussed: Current Exercise Habits: Home exercise routine, Type of exercise: stretching;Other - see comments (yoga-like stretching and balancing), Time (Minutes): 30, Frequency (Times/Week): 5, Weekly Exercise (Minutes/Week): 150, Intensity: Mild, Exercise limited by: None identified   Goals Addressed   None    Depression Screen    06/30/2022    1:08 PM 06/24/2021   10:33 AM 02/10/2021    9:40 AM 10/08/2020    2:23 PM 09/30/2019   10:30 AM 05/21/2018    4:34 PM 05/18/2017    4:21 PM  PHQ 2/9 Scores  PHQ - 2 Score 0 0 0 0 0 0 0  PHQ- 9 Score    1       Fall Risk    06/30/2022    1:07 PM 06/24/2021   10:30 AM 02/10/2021    9:40 AM 10/08/2020    2:22 PM 09/30/2019   10:30 AM  Fall Risk   Falls in the past year? 1 1 0 1 0  Number falls in past yr: 0 0  0 0  Injury with Fall? 0 0  0 0  Risk for fall due to : No Fall Risks History of fall(s)     Follow up Falls evaluation completed Falls prevention discussed       FALL RISK PREVENTION PERTAINING TO THE HOME:  Any stairs in or around the home? No  If so, are there any without handrails? No  Home free of loose throw rugs in walkways,  pet beds, electrical cords, etc? Yes  Adequate lighting in your home to reduce risk of falls? Yes   ASSISTIVE DEVICES UTILIZED TO PREVENT FALLS:  Life alert? No  Use of a cane, walker or w/c? No  Grab bars in the bathroom? No  Shower chair or bench in shower? Yes  Elevated toilet seat or a handicapped toilet? Yes   TIMED UP AND GO:  Was the test performed?  No, audio visit .    Cognitive Function:        06/30/2022    1:21 PM  6CIT Screen  What Year? 0 points  What month? 0 points  What time? 0 points  Count back from 20 0 points  Months in reverse 0 points  Repeat phrase 0 points  Total Score 0 points    Immunizations Immunization History  Administered Date(s) Administered   PFIZER(Purple Top)SARS-COV-2 Vaccination 11/01/2019, 11/14/2019, 11/27/2019   Tdap 08/15/2010    TDAP status: Due, Education has been provided regarding the importance of this vaccine. Advised may receive this vaccine at local pharmacy or Health Dept. Aware to provide a copy of the vaccination record if obtained from local pharmacy or Health Dept. Verbalized acceptance and understanding.  Flu Vaccine status: Due, Education has been provided regarding the importance of this vaccine. Advised may receive this vaccine at local pharmacy or Health Dept. Aware to provide a copy of the vaccination record if obtained from local pharmacy or Health Dept. Verbalized acceptance and understanding.  Pneumococcal vaccine status: Due, Education has been provided regarding the importance of this vaccine. Advised may receive this vaccine at local pharmacy or Health Dept. Aware to provide a copy of the  vaccination record if obtained from local pharmacy or Health Dept. Verbalized acceptance and understanding.  Covid-19 vaccine status: Information provided on how to obtain vaccines.   Qualifies for Shingles Vaccine? Yes   Zostavax completed No   Shingrix Completed?: No.    Education has been provided regarding the  importance of this vaccine. Patient has been advised to call insurance company to determine out of pocket expense if they have not yet received this vaccine. Advised may also receive vaccine at local pharmacy or Health Dept. Verbalized acceptance and understanding.  Screening Tests Health Maintenance  Topic Date Due   COLONOSCOPY (Pts 45-21yrs Insurance coverage will need to be confirmed)  Never done   Zoster Vaccines- Shingrix (1 of 2) Never done   Pneumonia Vaccine 97+ Years old (1 - PCV) Never done   TETANUS/TDAP  09/12/2021   INFLUENZA VACCINE  03/15/2022   Medicare Annual Wellness (AWV)  06/24/2022   DEXA SCAN  10/02/2022   MAMMOGRAM  10/05/2023   Hepatitis C Screening  Completed   HPV VACCINES  Aged Out   COVID-19 Vaccine  Discontinued    Health Maintenance  Health Maintenance Due  Topic Date Due   COLONOSCOPY (Pts 45-43yrs Insurance coverage will need to be confirmed)  Never done   Zoster Vaccines- Shingrix (1 of 2) Never done   Pneumonia Vaccine 41+ Years old (1 - PCV) Never done   TETANUS/TDAP  09/12/2021   INFLUENZA VACCINE  03/15/2022   Medicare Annual Wellness (AWV)  06/24/2022    Colorectal Cancer screening: pt prefers to wait until next year for referral  Mammogram status: Completed 10/04/21. Repeat every year  Bone Density status: Completed 10/02/20. Results reflect: Bone density results: OSTEOPENIA. Repeat every 2 years.  Lung Cancer Screening: (Low Dose CT Chest recommended if Age 2-80 years, 30 pack-year currently smoking OR have quit w/in 15years.) does not qualify.   Additional Screening:  Hepatitis C Screening: does qualify; Completed 04/13/17  Vision Screening: Recommended annual ophthalmology exams for early detection of glaucoma and other disorders of the eye. Is the patient up to date with their annual eye exam?  Yes  Who is the provider or what is the name of the office in which the patient attends annual eye exams? Dr. Sinda Du If pt is not  established with a provider, would they like to be referred to a provider to establish care? No .   Dental Screening: Recommended annual dental exams for proper oral hygiene  Community Resource Referral / Chronic Care Management: CRR required this visit?  No   CCM required this visit?  No      Plan:     I have personally reviewed and noted the following in the patient's chart:   Medical and social history Use of alcohol, tobacco or illicit drugs  Current medications and supplements including opioid prescriptions. Patient is not currently taking opioid prescriptions. Functional ability and status Nutritional status Physical activity Advanced directives List of other physicians Hospitalizations, surgeries, and ER visits in previous 12 months Vitals Screenings to include cognitive, depression, and falls Referrals and appointments  In addition, I have reviewed and discussed with patient certain preventive protocols, quality metrics, and best practice recommendations. A written personalized care plan for preventive services as well as general preventive health recommendations were provided to patient.   Due to this being a telephonic visit, the after visit summary with patients personalized plan was offered to patient via mail or my-chart. Patient would like to access on my-chart.  Donne Anon, [place]   06/30/2022   Nurse Notes: None

## 2022-06-30 NOTE — Progress Notes (Unsigned)
Subjective:   Susan Terrell is a 66 y.o. female who presents for Medicare Annual (Subsequent) preventive examination.  I connected with  Susan Terrell on 06/30/22 by a {Video Enabled:26378::"video and audio"} enabled telemedicine application and verified that I am speaking with the correct person using two identifiers.  {Patient Location:[phone]::"Home"}  {Provider Location:[phone]::"Home Office"}  I discussed the limitations of evaluation and management by telemedicine. The patient expressed understanding and agreed to proceed.   Review of Systems    Defer to PCP       Objective:    There were no vitals filed for this visit. There is no height or weight on file to calculate BMI.     06/24/2021   10:27 AM  Advanced Directives  Does Patient Have a Medical Advance Directive? No  Does patient want to make changes to medical advance directive? Yes (MAU/Ambulatory/Procedural Areas - Information given)    Current Medications (verified) Outpatient Encounter Medications as of 06/30/2022  Medication Sig   estradiol (ESTRACE) 0.1 MG/GM vaginal cream estradiol 0.01% (0.1 mg/gram) vaginal cream (Patient not taking: Reported on 02/10/2021)   levothyroxine (SYNTHROID) 50 MCG tablet TAKE 1 TABLET(50 MCG) BY MOUTH DAILY BEFORE BREAKFAST   Misc Natural Products (ELDERBERRY ZINC/VIT C/IMMUNE MT) Elderberry Zinc Vit C   tiZANidine (ZANAFLEX) 2 MG tablet Take 0.5-2 tablets (1-4 mg total) by mouth every 6 (six) hours as needed for muscle spasms. (Patient not taking: Reported on 02/10/2021)   Vitamin D, Ergocalciferol, (DRISDOL) 1.25 MG (50000 UNIT) CAPS capsule Take 1 capsule (50,000 Units total) by mouth every 7 (seven) days.   No facility-administered encounter medications on file as of 06/30/2022.    Allergies (verified) Bee venom, Codeine, Iodine, and Macrodantin   History: Past Medical History:  Diagnosis Date   Anal fissure    Asthma    childhood?   Cervical cancer screening  09/01/2015   Chicken pox as a child   Elevated BP 11/10/2013   Headache 03/01/2015   Hyperlipidemia 04/13/2017   Hypothyroidism 09/06/2015   IBS (irritable bowel syndrome) 2010   Measles as a child   Mumps as a child   Osteopenia 2013   Pneumonia    Preventative health care 09/01/2015   UTI (lower urinary tract infection)    Vitamin D deficiency 02/20/2015   Past Surgical History:  Procedure Laterality Date   ABDOMINAL HYSTERECTOMY     BREAST SURGERY     left needle biopsy   INCISE AND DRAIN ABCESS     right groin   PILONIDAL CYST EXCISION     Family History  Problem Relation Age of Onset   Cancer Mother        colon   Hyperlipidemia Mother    Glaucoma Mother    Thyroid disease Mother    Hypertension Mother    Cirrhosis Mother    Kidney disease Mother    Cancer Father        pancreatic    Cataracts Sister    Hyperlipidemia Sister    Glaucoma Sister    Arthritis Sister        rheumatoid   Heart attack Brother    Cancer Brother        bile duct cancer   Glaucoma Brother    Hyperlipidemia Brother    Hypertension Brother    Other Brother        carotid artery disease   Asthma Daughter    Hypertension Daughter    Colitis Daughter  Hyperlipidemia Daughter    Asthma Daughter    Cataracts Maternal Grandmother    Alcohol abuse Maternal Grandfather    Social History   Socioeconomic History   Marital status: Widowed    Spouse name: Not on file   Number of children: Not on file   Years of education: Not on file   Highest education level: Not on file  Occupational History   Not on file  Tobacco Use   Smoking status: Never   Smokeless tobacco: Never  Substance and Sexual Activity   Alcohol use: No   Drug use: No   Sexual activity: Yes    Birth control/protection: Surgical    Comment: lives with husband and daughter, no dietary restrictions.  Other Topics Concern   Not on file  Social History Narrative   Not on file   Social Determinants of Health    Financial Resource Strain: Low Risk  (06/24/2021)   Overall Financial Resource Strain (CARDIA)    Difficulty of Paying Living Expenses: Not hard at all  Food Insecurity: No Food Insecurity (06/24/2021)   Hunger Vital Sign    Worried About Running Out of Food in the Last Year: Never true    Ran Out of Food in the Last Year: Never true  Transportation Needs: No Transportation Needs (06/24/2021)   PRAPARE - Administrator, Civil Service (Medical): No    Lack of Transportation (Non-Medical): No  Physical Activity: Inactive (06/24/2021)   Exercise Vital Sign    Days of Exercise per Week: 0 days    Minutes of Exercise per Session: 0 min  Stress: No Stress Concern Present (06/24/2021)   [employer] of Occupational Health - Occupational Stress Questionnaire    Feeling of Stress : Not at all  Social Connections: Moderately Integrated (06/24/2021)   Social Connection and Isolation Panel [NHANES]    Frequency of Communication with Friends and Family: More than three times a week    Frequency of Social Gatherings with Friends and Family: More than three times a week    Attends Religious Services: More than 4 times per year    Active Member of [employer] or Organizations: Yes    Attends Banker Meetings: More than 4 times per year    Marital Status: Widowed    Tobacco Counseling Counseling given: Not Answered   Clinical Intake:                 Activities of Daily Living     No data to display          Patient Care Team: Bradd Canary, MD as PCP - General (Family Medicine)  Indicate any recent Medical Services you may have received from other than [facility] providers in the past year (date may be approximate).     Assessment:   This is a routine wellness examination for Susan Terrell.  Hearing/Vision screen No results found.  Dietary issues and exercise activities discussed:     Goals Addressed   None    Depression Screen    06/24/2021    10:33 AM 02/10/2021    9:40 AM 10/08/2020    2:23 PM 09/30/2019   10:30 AM 05/21/2018    4:34 PM 05/18/2017    4:21 PM 06/29/2016    8:13 AM  PHQ 2/9 Scores  PHQ - 2 Score 0 0 0 0 0 0 0  PHQ- 9 Score   1        Fall Risk  06/24/2021   10:30 AM 02/10/2021    9:40 AM 10/08/2020    2:22 PM 09/30/2019   10:30 AM 05/21/2018    4:34 PM  Fall Risk   Falls in the past year? 1 0 1 0 No  Number falls in past yr: 0  0 0   Injury with Fall? 0  0 0   Risk for fall due to : History of fall(s)      Follow up Falls prevention discussed        FALL RISK PREVENTION PERTAINING TO THE HOME:  Any stairs in or around the home? {YES/NO:21197} If so, are there any without handrails? {YES/NO:21197} Home free of loose throw rugs in walkways, pet beds, electrical cords, etc? {YES/NO:21197} Adequate lighting in your home to reduce risk of falls? {YES/NO:21197}  ASSISTIVE DEVICES UTILIZED TO PREVENT FALLS:  Life alert? {YES/NO:21197} Use of a cane, walker or w/c? {YES/NO:21197} Grab bars in the bathroom? {YES/NO:21197} Shower chair or bench in shower? {YES/NO:21197} Elevated toilet seat or a handicapped toilet? {YES/NO:21197}  TIMED UP AND GO:  Was the test performed? {YES/NO:21197}.  Length of time to ambulate 10 feet: *** sec.   {Appearance of ALPF:7902409}  Cognitive Function:        Immunizations Immunization History  Administered Date(s) Administered   PFIZER(Purple Top)SARS-COV-2 Vaccination 11/01/2019, 11/14/2019, 11/27/2019   Tdap 08/15/2010    TDAP status: Due, Education has been provided regarding the importance of this vaccine. Advised may receive this vaccine at local pharmacy or Health Dept. Aware to provide a copy of the vaccination record if obtained from local pharmacy or Health Dept. Verbalized acceptance and understanding.  Flu Vaccine status: Due, Education has been provided regarding the importance of this vaccine. Advised may receive this vaccine at local pharmacy or  Health Dept. Aware to provide a copy of the vaccination record if obtained from local pharmacy or Health Dept. Verbalized acceptance and understanding.  Pneumococcal vaccine status: Due, Education has been provided regarding the importance of this vaccine. Advised may receive this vaccine at local pharmacy or Health Dept. Aware to provide a copy of the vaccination record if obtained from local pharmacy or Health Dept. Verbalized acceptance and understanding.  Covid-19 vaccine status: Information provided on how to obtain vaccines.   Qualifies for Shingles Vaccine? Yes   Zostavax completed No   Shingrix Completed?: No.    Education has been provided regarding the importance of this vaccine. Patient has been advised to call insurance company to determine out of pocket expense if they have not yet received this vaccine. Advised may also receive vaccine at local pharmacy or Health Dept. Verbalized acceptance and understanding.  Screening Tests Health Maintenance  Topic Date Due   COLONOSCOPY (Pts 45-64yrs Insurance coverage will need to be confirmed)  Never done   Zoster Vaccines- Shingrix (1 of 2) Never done   Pneumonia Vaccine 29+ Years old (1 - PCV) Never done   TETANUS/TDAP  09/12/2021   INFLUENZA VACCINE  03/15/2022   Medicare Annual Wellness (AWV)  06/24/2022   DEXA SCAN  10/02/2022   MAMMOGRAM  10/05/2023   Hepatitis C Screening  Completed   HPV VACCINES  Aged Out   COVID-19 Vaccine  Discontinued    Health Maintenance  Health Maintenance Due  Topic Date Due   COLONOSCOPY (Pts 45-91yrs Insurance coverage will need to be confirmed)  Never done   Zoster Vaccines- Shingrix (1 of 2) Never done   Pneumonia Vaccine 16+ Years old (1 - PCV) Never  done   TETANUS/TDAP  09/12/2021   INFLUENZA VACCINE  03/15/2022   Medicare Annual Wellness (AWV)  06/24/2022    {Colorectal cancer screening:2101809}  Mammogram status: Completed 10/04/21. Repeat every year  Bone Density status: Completed  09/15/20. Results reflect: Bone density results: OSTEOPENIA. Repeat every 2 years.  Lung Cancer Screening: (Low Dose CT Chest recommended if Age 49-80 years, 30 pack-year currently smoking OR have quit w/in 15years.) does not qualify.   Additional Screening:  Hepatitis C Screening: does qualify; Completed 04/13/17  Vision Screening: Recommended annual ophthalmology exams for early detection of glaucoma and other disorders of the eye. Is the patient up to date with their annual eye exam?  {YES/NO:21197} Who is the provider or what is the name of the office in which the patient attends annual eye exams? *** If pt is not established with a provider, would they like to be referred to a provider to establish care? {YES/NO:21197}.   Dental Screening: Recommended annual dental exams for proper oral hygiene  Community Resource Referral / Chronic Care Management: CRR required this visit?  No   CCM required this visit?  No      Plan:     I have personally reviewed and noted the following in the patient's chart:   Medical and social history Use of alcohol, tobacco or illicit drugs  Current medications and supplements including opioid prescriptions. Patient is not currently taking opioid prescriptions. Functional ability and status Nutritional status Physical activity Advanced directives List of other physicians Hospitalizations, surgeries, and ER visits in previous 12 months Vitals Screenings to include cognitive, depression, and falls Referrals and appointments  In addition, I have reviewed and discussed with patient certain preventive protocols, quality metrics, and best practice recommendations. A written personalized care plan for preventive services as well as general preventive health recommendations were provided to patient.   Due to this being a telephonic visit, the after visit summary with patients personalized plan was offered to patient via mail or my-chart. ***Patient  declined at this time./ Patient would like to access on my-chart/ per request, patient was mailed a copy of AVS.  Donne AnonBender, Desera Graffeo, CMA   06/30/2022   Nurse Notes: None

## 2022-06-30 NOTE — Patient Instructions (Signed)
Susan Terrell , Thank you for taking time to come for your Medicare Wellness Visit. I appreciate your ongoing commitment to your health goals. Please review the following plan we discussed and let me know if I can assist you in the future.   These are the goals we discussed:  Goals      Patient Stated     Increase activity        This is a list of the screening recommended for you and due dates:  Health Maintenance  Topic Date Due   Colon Cancer Screening  Never done   Zoster (Shingles) Vaccine (1 of 2) Never done   Pneumonia Vaccine (1 - PCV) Never done   Tetanus Vaccine  09/12/2021   Flu Shot  03/15/2022   DEXA scan (bone density measurement)  10/02/2022   Medicare Annual Wellness Visit  07/01/2023   Mammogram  10/05/2023   Hepatitis C Screening: USPSTF Recommendation to screen - Ages 18-79 yo.  Completed   HPV Vaccine  Aged Out   COVID-19 Vaccine  Discontinued     Next appointment: Follow up in one year for your annual wellness visit    Preventive Care 65 Years and Older, Female Preventive care refers to lifestyle choices and visits with your health care provider that can promote health and wellness. What does preventive care include? A yearly physical exam. This is also called an annual well check. Dental exams once or twice a year. Routine eye exams. Ask your health care provider how often you should have your eyes checked. Personal lifestyle choices, including: Daily care of your teeth and gums. Regular physical activity. Eating a healthy diet. Avoiding tobacco and drug use. Limiting alcohol use. Practicing safe sex. Taking low-dose aspirin every day. Taking vitamin and mineral supplements as recommended by your health care provider. What happens during an annual well check? The services and screenings done by your health care provider during your annual well check will depend on your age, overall health, lifestyle risk factors, and family history of  disease. Counseling  Your health care provider may ask you questions about your: Alcohol use. Tobacco use. Drug use. Emotional well-being. Home and relationship well-being. Sexual activity. Eating habits. History of falls. Memory and ability to understand (cognition). Work and work Astronomer. Reproductive health. Screening  You may have the following tests or measurements: Height, weight, and BMI. Blood pressure. Lipid and cholesterol levels. These may be checked every 5 years, or more frequently if you are over 23 years old. Skin check. Lung cancer screening. You may have this screening every year starting at age 27 if you have a 30-pack-year history of smoking and currently smoke or have quit within the past 15 years. Fecal occult blood test (FOBT) of the stool. You may have this test every year starting at age 62. Flexible sigmoidoscopy or colonoscopy. You may have a sigmoidoscopy every 5 years or a colonoscopy every 10 years starting at age 58. Hepatitis C blood test. Hepatitis B blood test. Sexually transmitted disease (STD) testing. Diabetes screening. This is done by checking your blood sugar (glucose) after you have not eaten for a while (fasting). You may have this done every 1-3 years. Bone density scan. This is done to screen for osteoporosis. You may have this done starting at age 43. Mammogram. This may be done every 1-2 years. Talk to your health care provider about how often you should have regular mammograms. Talk with your health care provider about your test results, treatment  options, and if necessary, the need for more tests. Vaccines  Your health care provider may recommend certain vaccines, such as: Influenza vaccine. This is recommended every year. Tetanus, diphtheria, and acellular pertussis (Tdap, Td) vaccine. You may need a Td booster every 10 years. Zoster vaccine. You may need this after age 52. Pneumococcal 13-valent conjugate (PCV13) vaccine. One  dose is recommended after age 2. Pneumococcal polysaccharide (PPSV23) vaccine. One dose is recommended after age 75. Talk to your health care provider about which screenings and vaccines you need and how often you need them. This information is not intended to replace advice given to you by your health care provider. Make sure you discuss any questions you have with your health care provider. Document Released: 08/28/2015 Document Revised: 04/20/2016 Document Reviewed: 06/02/2015 Elsevier Interactive Patient Education  2017 [place] Prevention in the Home Falls can cause injuries. They can happen to people of all ages. There are many things you can do to make your home safe and to help prevent falls. What can I do on the outside of my home? Regularly fix the edges of walkways and driveways and fix any cracks. Remove anything that might make you trip as you walk through a door, such as a raised step or threshold. Trim any bushes or trees on the path to your home. Use bright outdoor lighting. Clear any walking paths of anything that might make someone trip, such as rocks or tools. Regularly check to see if handrails are loose or broken. Make sure that both sides of any steps have handrails. Any raised decks and porches should have guardrails on the edges. Have any leaves, snow, or ice cleared regularly. Use sand or salt on walking paths during winter. Clean up any spills in your garage right away. This includes oil or grease spills. What can I do in the bathroom? Use night lights. Install grab bars by the toilet and in the tub and shower. Do not use towel bars as grab bars. Use non-skid mats or decals in the tub or shower. If you need to sit down in the shower, use a plastic, non-slip stool. Keep the floor dry. Clean up any water that spills on the floor as soon as it happens. Remove soap buildup in the tub or shower regularly. Attach bath mats securely with double-sided  non-slip rug tape. Do not have throw rugs and other things on the floor that can make you trip. What can I do in the bedroom? Use night lights. Make sure that you have a light by your bed that is easy to reach. Do not use any sheets or blankets that are too big for your bed. They should not hang down onto the floor. Have a firm chair that has side arms. You can use this for support while you get dressed. Do not have throw rugs and other things on the floor that can make you trip. What can I do in the kitchen? Clean up any spills right away. Avoid walking on wet floors. Keep items that you use a lot in easy-to-reach places. If you need to reach something above you, use a strong step stool that has a grab bar. Keep electrical cords out of the way. Do not use floor polish or wax that makes floors slippery. If you must use wax, use non-skid floor wax. Do not have throw rugs and other things on the floor that can make you trip. What can I do with my stairs? Do not  leave any items on the stairs. Make sure that there are handrails on both sides of the stairs and use them. Fix handrails that are broken or loose. Make sure that handrails are as long as the stairways. Check any carpeting to make sure that it is firmly attached to the stairs. Fix any carpet that is loose or worn. Avoid having throw rugs at the top or bottom of the stairs. If you do have throw rugs, attach them to the floor with carpet tape. Make sure that you have a light switch at the top of the stairs and the bottom of the stairs. If you do not have them, ask someone to add them for you. What else can I do to help prevent falls? Wear shoes that: Do not have high heels. Have rubber bottoms. Are comfortable and fit you well. Are closed at the toe. Do not wear sandals. If you use a stepladder: Make sure that it is fully opened. Do not climb a closed stepladder. Make sure that both sides of the stepladder are locked into place. Ask  someone to hold it for you, if possible. Clearly mark and make sure that you can see: Any grab bars or handrails. First and last steps. Where the edge of each step is. Use tools that help you move around (mobility aids) if they are needed. These include: Canes. Walkers. Scooters. Crutches. Turn on the lights when you go into a dark area. Replace any light bulbs as soon as they burn out. Set up your furniture so you have a clear path. Avoid moving your furniture around. If any of your floors are uneven, fix them. If there are any pets around you, be aware of where they are. Review your medicines with your doctor. Some medicines can make you feel dizzy. This can increase your chance of falling. Ask your doctor what other things that you can do to help prevent falls. This information is not intended to replace advice given to you by your health care provider. Make sure you discuss any questions you have with your health care provider. Document Released: 05/28/2009 Document Revised: 01/07/2016 Document Reviewed: 09/05/2014 Elsevier Interactive Patient Education  2017 [employer].

## 2022-07-04 ENCOUNTER — Other Ambulatory Visit (INDEPENDENT_AMBULATORY_CARE_PROVIDER_SITE_OTHER): Payer: Medicare Other

## 2022-07-04 DIAGNOSIS — E785 Hyperlipidemia, unspecified: Secondary | ICD-10-CM | POA: Diagnosis not present

## 2022-07-04 DIAGNOSIS — R519 Headache, unspecified: Secondary | ICD-10-CM

## 2022-07-04 DIAGNOSIS — E039 Hypothyroidism, unspecified: Secondary | ICD-10-CM | POA: Diagnosis not present

## 2022-07-04 DIAGNOSIS — E559 Vitamin D deficiency, unspecified: Secondary | ICD-10-CM

## 2022-07-04 LAB — COMPREHENSIVE METABOLIC PANEL
ALT: 12 U/L (ref 0–35)
AST: 19 U/L (ref 0–37)
Albumin: 3.8 g/dL (ref 3.5–5.2)
Alkaline Phosphatase: 73 U/L (ref 39–117)
BUN: 24 mg/dL — ABNORMAL HIGH (ref 6–23)
CO2: 27 mEq/L (ref 19–32)
Calcium: 8.5 mg/dL (ref 8.4–10.5)
Chloride: 106 mEq/L (ref 96–112)
Creatinine, Ser: 0.75 mg/dL (ref 0.40–1.20)
GFR: 81.85 mL/min (ref >60.00)
Glucose, Bld: 91 mg/dL (ref 70–99)
Potassium: 4.1 mEq/L (ref 3.5–5.1)
Sodium: 140 mEq/L (ref 135–145)
Total Bilirubin: 0.3 mg/dL (ref 0.2–1.2)
Total Protein: 6.4 g/dL (ref 6.0–8.3)

## 2022-07-04 LAB — LIPID PANEL
Cholesterol: 167 mg/dL (ref 0–200)
HDL: 54.2 mg/dL (ref >39.00)
LDL Cholesterol: 101 mg/dL — ABNORMAL HIGH (ref 0–99)
NonHDL: 113.05
Total CHOL/HDL Ratio: 3
Triglycerides: 59 mg/dL (ref 0.0–149.0)
VLDL: 11.8 mg/dL (ref 0.0–40.0)

## 2022-07-04 LAB — CBC
HCT: 39.4 % (ref 36.0–46.0)
Hemoglobin: 13 g/dL (ref 12.0–15.0)
MCHC: 33 g/dL (ref 30.0–36.0)
MCV: 93.3 fl (ref 78.0–100.0)
Platelets: 247 10*3/uL (ref 150.0–400.0)
RBC: 4.23 Mil/uL (ref 3.87–5.11)
RDW: 14.5 % (ref 11.5–15.5)
WBC: 5.9 10*3/uL (ref 4.0–10.5)

## 2022-07-04 LAB — VITAMIN D 25 HYDROXY (VIT D DEFICIENCY, FRACTURES): VITD: 33.63 ng/mL (ref 30.00–100.00)

## 2022-07-04 LAB — TSH: TSH: 3.41 u[IU]/mL (ref 0.35–5.50)

## 2022-10-07 ENCOUNTER — Other Ambulatory Visit: Payer: Self-pay | Admitting: Family Medicine

## 2022-10-10 DIAGNOSIS — Z1231 Encounter for screening mammogram for malignant neoplasm of breast: Secondary | ICD-10-CM | POA: Diagnosis not present

## 2022-10-10 LAB — HM MAMMOGRAPHY

## 2022-11-16 DIAGNOSIS — L308 Other specified dermatitis: Secondary | ICD-10-CM | POA: Diagnosis not present

## 2022-11-16 DIAGNOSIS — L821 Other seborrheic keratosis: Secondary | ICD-10-CM | POA: Diagnosis not present

## 2022-11-16 DIAGNOSIS — L814 Other melanin hyperpigmentation: Secondary | ICD-10-CM | POA: Diagnosis not present

## 2022-11-16 DIAGNOSIS — L570 Actinic keratosis: Secondary | ICD-10-CM | POA: Diagnosis not present

## 2022-11-16 DIAGNOSIS — D1801 Hemangioma of skin and subcutaneous tissue: Secondary | ICD-10-CM | POA: Diagnosis not present

## 2022-11-28 ENCOUNTER — Encounter: Payer: Self-pay | Admitting: *Deleted

## 2023-01-07 ENCOUNTER — Other Ambulatory Visit: Payer: Self-pay | Admitting: Family Medicine

## 2023-03-20 DIAGNOSIS — R42 Dizziness and giddiness: Secondary | ICD-10-CM | POA: Diagnosis not present

## 2023-03-21 ENCOUNTER — Telehealth: Payer: Self-pay | Admitting: Family Medicine

## 2023-03-21 ENCOUNTER — Ambulatory Visit (INDEPENDENT_AMBULATORY_CARE_PROVIDER_SITE_OTHER): Payer: Medicare Other | Admitting: Family

## 2023-03-21 VITALS — BP 152/85 | HR 72 | Temp 97.9°F | Resp 16 | Wt 149.0 lb

## 2023-03-21 DIAGNOSIS — R55 Syncope and collapse: Secondary | ICD-10-CM

## 2023-03-21 DIAGNOSIS — I10 Essential (primary) hypertension: Secondary | ICD-10-CM | POA: Diagnosis not present

## 2023-03-21 NOTE — Telephone Encounter (Signed)
Spoke with triage , pt scheduled today with Melissa 03/21/23 at 520

## 2023-03-21 NOTE — Telephone Encounter (Signed)
Pt called and stated she almost fainted while at work yesterday and wanted to speak to a triage nurse. EMS was called and she was evaluated. She stated they told her her bp was elevated but she did not know the number but her heart rate and blood sugar was fine. Transferred to triage.

## 2023-03-21 NOTE — Assessment & Plan Note (Signed)
Episode of lightheadedness and near syncope at work, likely secondary to dehydration. EKG by EMS was reportedly normal. No recurrent symptoms since the episode. -Order CBC and CMP to assess for anemia and electrolyte abnormalities. -Advise to maintain adequate hydration, especially during busy work days. If recurrent symptoms we discussed that she will need additional work up.

## 2023-03-21 NOTE — Progress Notes (Signed)
Subjective:     Patient ID: Susan Terrell, female    DOB: [birth year], 67 y.o.   MRN: [id number]  Chief Complaint  Patient presents with   Near Syncope    Patient had two episodes of "almost passing out at work yesterday"    HPI  Discussed the use of AI scribe software for clinical note transcription with the patient, who gave verbal consent to proceed.  History of Present Illness   The patient, with a history of hypertension, presents with a recent episode of near syncope at work. The episode occurred after a busy morning of meetings, during which the patient admits to forgetting to hydrate and eat properly. The patient experienced lightheadedness upon standing, which led to her sitting and then lying down on the floor. The patient's blood pressure was measured at 178/101 by a coworker, and her blood sugar was 102. Emergency medical services were called, and the patient was evaluated in an ambulance. An EKG was performed, which was reported as normal. The patient was advised that she was likely dehydrated. The patient's blood pressure decreased slightly after lying down and hydrating. The patient has since been resting at home and has been feeling well. The patient admits to not hydrating adequately even after the episode.  Daughter repeated her BP at home yesterday evening and her bp was 140/82.     Bp last night at home was 140/82.      Health Maintenance Due  Topic Date Due   Colonoscopy  Never done   Zoster Vaccines- Shingrix (1 of 2) Never done   Pneumonia Vaccine 75+ Years old (1 of 1 - PCV) Never done   DTaP/Tdap/Td (2 - Td or Tdap) 08/15/2020   DEXA SCAN  10/02/2022   INFLUENZA VACCINE  03/16/2023    Past Medical History:  Diagnosis Date   Anal fissure    Asthma    childhood?   Cervical cancer screening 09/01/2015   Chicken pox as a child   Elevated BP 11/10/2013   Headache 03/01/2015   Hyperlipidemia 04/13/2017   Hypothyroidism 09/06/2015   IBS (irritable bowel  syndrome) 2010   Measles as a child   Mumps as a child   Osteopenia 2013   Pneumonia    Preventative health care 09/01/2015   UTI (lower urinary tract infection)    Vitamin D deficiency 02/20/2015    Past Surgical History:  Procedure Laterality Date   ABDOMINAL HYSTERECTOMY     BREAST SURGERY     left needle biopsy   INCISE AND DRAIN ABCESS     right groin   PILONIDAL CYST EXCISION      Family History  Problem Relation Age of Onset   Cancer Mother        colon   Hyperlipidemia Mother    Glaucoma Mother    Thyroid disease Mother    Hypertension Mother    Cirrhosis Mother    Kidney disease Mother    Cancer Father        pancreatic    Cataracts Sister    Hyperlipidemia Sister    Glaucoma Sister    Arthritis Sister        rheumatoid   Heart attack Brother    Cancer Brother        bile duct cancer   Glaucoma Brother    Hyperlipidemia Brother    Hypertension Brother    Other Brother        carotid artery disease   Asthma Daughter  Hypertension Daughter    Colitis Daughter    Hyperlipidemia Daughter    Asthma Daughter    Cataracts Maternal Grandmother    Alcohol abuse Maternal Grandfather     Social History   Socioeconomic History   Marital status: Widowed    Spouse name: Not on file   Number of children: Not on file   Years of education: Not on file   Highest education level: Not on file  Occupational History   Not on file  Tobacco Use   Smoking status: Never   Smokeless tobacco: Never  Substance and Sexual Activity   Alcohol use: No   Drug use: No   Sexual activity: Yes    Birth control/protection: Surgical    Comment: lives with husband and daughter, no dietary restrictions.  Other Topics Concern   Not on file  Social History Narrative   Not on file   Social Determinants of Health   Financial Resource Strain: Low Risk  (06/24/2021)   Overall Financial Resource Strain (CARDIA)    Difficulty of Paying Living Expenses: Not hard at all  Food  Insecurity: No Food Insecurity (06/24/2021)   Hunger Vital Sign    Worried About Running Out of Food in the Last Year: Never true    Ran Out of Food in the Last Year: Never true  Transportation Needs: No Transportation Needs (06/24/2021)   PRAPARE - Administrator, Civil Service (Medical): No    Lack of Transportation (Non-Medical): No  Physical Activity: Inactive (06/24/2021)   Exercise Vital Sign    Days of Exercise per Week: 0 days    Minutes of Exercise per Session: 0 min  Stress: No Stress Concern Present (06/24/2021)   [employer] of Occupational Health - Occupational Stress Questionnaire    Feeling of Stress : Not at all  Social Connections: Moderately Integrated (06/24/2021)   Social Connection and Isolation Panel [NHANES]    Frequency of Communication with Friends and Family: More than three times a week    Frequency of Social Gatherings with Friends and Family: More than three times a week    Attends Religious Services: More than 4 times per year    Active Member of [employer] or Organizations: Yes    Attends Banker Meetings: More than 4 times per year    Marital Status: Widowed  Intimate Partner Violence: Not At Risk (06/24/2021)   Humiliation, Afraid, Rape, and Kick questionnaire    Fear of Current or Ex-Partner: No    Emotionally Abused: No    Physically Abused: No    Sexually Abused: No    Outpatient Medications Prior to Visit  Medication Sig Dispense Refill   levothyroxine (SYNTHROID) 50 MCG tablet TAKE 1 TABLET(50 MCG) BY MOUTH DAILY BEFORE BREAKFAST 90 tablet 1   Misc Natural Products (ELDERBERRY ZINC/VIT C/IMMUNE MT) Elderberry Zinc Vit C     estradiol (ESTRACE) 0.1 MG/GM vaginal cream estradiol 0.01% (0.1 mg/gram) vaginal cream (Patient not taking: Reported on 02/10/2021)     tiZANidine (ZANAFLEX) 2 MG tablet Take 0.5-2 tablets (1-4 mg total) by mouth every 6 (six) hours as needed for muscle spasms. (Patient not taking: Reported on  02/10/2021) 30 tablet 1   Vitamin D, Ergocalciferol, (DRISDOL) 1.25 MG (50000 UNIT) CAPS capsule Take 1 capsule (50,000 Units total) by mouth every 7 (seven) days. 4 capsule 4   No facility-administered medications prior to visit.    Allergies  Allergen Reactions   Bee Venom  Swelling localized   Codeine    Iodine    Macrodantin     ROS See HPI    Objective:    Physical Exam Constitutional:      General: She is not in acute distress.    Appearance: Normal appearance. She is well-developed.  HENT:     Head: Normocephalic and atraumatic.     Right Ear: External ear normal.     Left Ear: External ear normal.  Eyes:     General: No scleral icterus. Neck:     Thyroid: No thyromegaly.  Cardiovascular:     Rate and Rhythm: Normal rate and regular rhythm.     Heart sounds: Normal heart sounds. No murmur heard. Pulmonary:     Effort: Pulmonary effort is normal. No respiratory distress.     Breath sounds: Normal breath sounds. No wheezing.  Musculoskeletal:        General: No swelling.     Cervical back: Neck supple.  Skin:    General: Skin is warm and dry.  Neurological:     Mental Status: She is alert and oriented to person, place, and time.     Cranial Nerves: No cranial nerve deficit.  Psychiatric:        Mood and Affect: Mood normal.        Behavior: Behavior normal.        Thought Content: Thought content normal.        Judgment: Judgment normal.      BP (!) 152/85   Pulse 72   Temp 97.9 F (36.6 C) (Oral)   Resp 16   Wt 149 lb (67.6 kg)   SpO2 100%   BMI 27.25 kg/m  Wt Readings from Last 3 Encounters:  03/21/23 149 lb (67.6 kg)  06/24/21 147 lb (66.7 kg)  12/29/20 147 lb (66.7 kg)       Assessment & Plan:   Problem List Items Addressed This Visit       Unprioritized   Near syncope - Primary    Episode of lightheadedness and near syncope at work, likely secondary to dehydration. EKG by EMS was reportedly normal. No recurrent symptoms since  the episode. -Order CBC and CMP to assess for anemia and electrolyte abnormalities. -Advise to maintain adequate hydration, especially during busy work days. If recurrent symptoms we discussed that she will need additional work up.        Relevant Orders   Comp Met (CMET)   CBC w/Diff   High blood pressure    BP Readings from Last 3 Encounters:  03/21/23 (!) 152/85  12/29/20 124/68  10/08/20 122/70   BP mildly elevated today.  I am hesitant to start her on a BP med given recent event.  She is advised to check bp once daily at home for 1 week and then to send me her readings for review. If home BP readings remain elevated, will plan to initiate an antihypertensive.        I have discontinued Bellarae T. Rhein's estradiol, tiZANidine, and Vitamin D (Ergocalciferol). I am also having her maintain her Misc Natural Products (ELDERBERRY ZINC/VIT C/IMMUNE MT) and levothyroxine.  No orders of the defined types were placed in this encounter.

## 2023-03-21 NOTE — Assessment & Plan Note (Signed)
BP Readings from Last 3 Encounters:  03/21/23 (!) 152/85  12/29/20 124/68  10/08/20 122/70   BP mildly elevated today.  I am hesitant to start her on a BP med given recent event.  She is advised to check bp once daily at home for 1 week and then to send me her readings for review. If home BP readings remain elevated, will plan to initiate an antihypertensive.

## 2023-03-22 NOTE — Telephone Encounter (Signed)
Pt was in for an OV yesterday 03/21/23  Client [facility] - Client Client Site [facility] - Day Provider Danise Edge - MD Contact Type Call Who Is Calling Patient / Member / Family / Caregiver Caller Name Shasta Barbaree Caller Phone Number [phone] Patient Name Susan Terrell Patient DOB [birth year] Call Type Message Only Information Provided Reason for Call Request to Schedule Office Appointment Initial Comment Caller states she was touching base, she had a fainting spell at work yesterday. She was checked by the EMT's, she couldn't remember when she had last been seen in office. She stayed home today, but was told she was a little dehydrated. She missed her synthroid on Saturday. Disp. Time Disposition Final User 03/21/2023 4:39:50 PM General Information Provided Yes Eugenia Pancoast Call Closed By: Eugenia Pancoast Transaction Date/Time: 03/21/2023 4:26:33 PM (ET

## 2023-03-22 NOTE — Patient Instructions (Signed)
VISIT SUMMARY:  During your recent visit, we discussed your episode of  near fainting at work, which was likely due to dehydration. We also discussed your high blood pressure readings. We have ordered some tests to check for any abnormalities in your blood and advised you to monitor your blood pressure at home for a week. We also discussed the importance of maintaining a healthy lifestyle, including staying hydrated, eating a balanced diet, and taking your prescribed medication.  YOUR PLAN:  -Near SYNCOPE: Syncope is a temporary loss of consciousness usually related to insufficient blood flow to the brain. In your case, it was likely due to dehydration. We have ordered a Complete Blood Count (CBC) and Comprehensive Metabolic Panel (CMP) to check for any abnormalities in your blood. It's important to stay hydrated, especially during busy work days.  -HYPERTENSION: Hypertension, or high blood pressure, can lead to serious health problems if not managed. Your blood pressure readings at the office were elevated. We have advised you to monitor your blood pressure at home for a week and then send [place] your readings via mychart. If your readings consistently stay above 140/90, we may need to start you on medication to control your blood pressure.  -GENERAL HEALTH MAINTENANCE: Maintaining a healthy lifestyle is important for overall health. Continue taking your Synthroid as prescribed. We also recommend a balanced diet with low sodium intake and regular meals to avoid low blood sugar. We will follow up in 3 months or sooner if you have recurrent symptoms.  INSTRUCTIONS:  Monitor your blood pressure at home for a week and report back the readings. Stay hydrated, especially during busy work days. Continue taking your Synthroid as prescribed. Maintain a balanced diet with low sodium intake and regular meals. Follow up in 3 months or sooner if you have recurrent symptoms.

## 2023-05-10 DIAGNOSIS — Z01419 Encounter for gynecological examination (general) (routine) without abnormal findings: Secondary | ICD-10-CM | POA: Diagnosis not present

## 2023-05-10 DIAGNOSIS — Z6827 Body mass index (BMI) 27.0-27.9, adult: Secondary | ICD-10-CM | POA: Diagnosis not present

## 2023-05-10 DIAGNOSIS — R319 Hematuria, unspecified: Secondary | ICD-10-CM | POA: Diagnosis not present

## 2023-06-28 NOTE — Assessment & Plan Note (Signed)
On Levothyroxine, continue to monitor 

## 2023-06-28 NOTE — Assessment & Plan Note (Signed)
Encourage heart healthy diet such as MIND or DASH diet, increase exercise, avoid trans fats, simple carbohydrates and processed foods, consider a krill or fish or flaxseed oil cap daily.  °

## 2023-06-28 NOTE — Assessment & Plan Note (Signed)
Supplement and monitor 

## 2023-06-28 NOTE — Assessment & Plan Note (Signed)
No recent exacerbation 

## 2023-06-29 ENCOUNTER — Encounter: Payer: Self-pay | Admitting: Family Medicine

## 2023-06-29 ENCOUNTER — Ambulatory Visit: Payer: Medicare Other | Admitting: Family Medicine

## 2023-06-29 VITALS — BP 136/78 | HR 60 | Temp 98.0°F | Resp 16 | Ht 62.0 in | Wt 151.4 lb

## 2023-06-29 DIAGNOSIS — Z780 Asymptomatic menopausal state: Secondary | ICD-10-CM

## 2023-06-29 DIAGNOSIS — J45909 Unspecified asthma, uncomplicated: Secondary | ICD-10-CM

## 2023-06-29 DIAGNOSIS — E039 Hypothyroidism, unspecified: Secondary | ICD-10-CM | POA: Diagnosis not present

## 2023-06-29 DIAGNOSIS — E785 Hyperlipidemia, unspecified: Secondary | ICD-10-CM

## 2023-06-29 DIAGNOSIS — E2839 Other primary ovarian failure: Secondary | ICD-10-CM

## 2023-06-29 DIAGNOSIS — E559 Vitamin D deficiency, unspecified: Secondary | ICD-10-CM

## 2023-06-29 MED ORDER — BETAMETHASONE VALERATE 0.12 % EX FOAM: 1.0000 | Freq: Two times a day (BID) | CUTANEOUS | 3 refills | Status: AC

## 2023-06-29 NOTE — Patient Instructions (Addendum)
Tetanus is due at pharmacy  Prevnar 20 daily at pharmacy  Shingrix is the new shingles shot, 2 shots over 2-6 months, confirm coverage with insurance and document, then can return here for shots with nurse appt or at pharmacy   Annual COVID and flu booster  Sarna anti-itch

## 2023-06-29 NOTE — Progress Notes (Signed)
Subjective:    Patient ID: Susan Terrell, female    DOB: Dec 26, 1955, 67 y.o.   MRN: [id number]  Chief Complaint  Patient presents with  . Follow-up    3 month    HPI Discussed the use of AI scribe software for clinical note transcription with the patient, who gave verbal consent to proceed.  History of Present Illness   The patient, with a history of skin issues, presents with a concern about a skin condition on her finger, which she believes to be eczema. The condition is characterized by itching, especially when exposed to water, and occasional oozing. The patient reports that the condition worsens with the use of certain hand sanitizers and improves with the application of Aquaphor. She has previously used betamethasone cream for a similar issue, which she found helpful.  The patient also discusses her recent mammogram, which included a computer-aided detection (CAD) system. She is unsure of the results as she has not checked her online patient portal. She also mentions that she is due for a bone density scan, which she usually gets done with her mammogram.  The patient has a family history of cancer, with her father having had pancreatic cancer and her brother having passed away from bile duct cancer. She has recently had genetic testing done due to this family history.  The patient is also due for a colonoscopy, which is being arranged by her gynecologist's office. She expresses some anxiety about the procedure due to a colleague's experience with complications.  The patient declines vaccinations at this time, stating that she is trying natural remedies. She does express some willingness to consider them in the future.        Past Medical History:  Diagnosis Date  . Anal fissure   . Asthma    childhood?  . Cervical cancer screening 09/01/2015  . Chicken pox as a child  . Elevated BP 11/10/2013  . Headache 03/01/2015  . Hyperlipidemia 04/13/2017  . Hypothyroidism 09/06/2015  .  IBS (irritable bowel syndrome) 2010  . Measles as a child  . Mumps as a child  . Osteopenia 2013  . Pneumonia   . Preventative health care 09/01/2015  . UTI (lower urinary tract infection)   . Vitamin D deficiency 02/20/2015    Past Surgical History:  Procedure Laterality Date  . ABDOMINAL HYSTERECTOMY    . BREAST SURGERY     left needle biopsy  . INCISE AND DRAIN ABCESS     right groin  . PILONIDAL CYST EXCISION      Family History  Problem Relation Age of Onset  . Cancer Mother        colon  . Hyperlipidemia Mother   . Glaucoma Mother   . Thyroid disease Mother   . Hypertension Mother   . Cirrhosis Mother   . Kidney disease Mother   . Cancer Father        pancreatic   . Cataracts Sister   . Hyperlipidemia Sister   . Glaucoma Sister   . Arthritis Sister        rheumatoid  . Heart attack Brother   . Cancer Brother        bile duct cancer  . Glaucoma Brother   . Hyperlipidemia Brother   . Hypertension Brother   . Other Brother        carotid artery disease  . Asthma Daughter   . Hypertension Daughter   . Colitis Daughter   . Hyperlipidemia Daughter   .  Asthma Daughter   . Cataracts Maternal Grandmother   . Alcohol abuse Maternal Grandfather     Social History   Socioeconomic History  . Marital status: Widowed    Spouse name: Not on file  . Number of children: Not on file  . Years of education: Not on file  . Highest education level: Not on file  Occupational History  . Not on file  Tobacco Use  . Smoking status: Never  . Smokeless tobacco: Never  Substance and Sexual Activity  . Alcohol use: No  . Drug use: No  . Sexual activity: Yes    Birth control/protection: Surgical    Comment: lives with husband and daughter, no dietary restrictions.  Other Topics Concern  . Not on file  Social History Narrative  . Not on file   Social Determinants of Health   Financial Resource Strain: Low Risk  (06/24/2021)   Overall Financial Resource Strain  (CARDIA)   . Difficulty of Paying Living Expenses: Not hard at all  Food Insecurity: No Food Insecurity (06/24/2021)   Hunger Vital Sign   . Worried About Programme researcher, broadcasting/film/video in the Last Year: Never true   . Ran Out of Food in the Last Year: Never true  Transportation Needs: No Transportation Needs (06/24/2021)   PRAPARE - Transportation   . Lack of Transportation (Medical): No   . Lack of Transportation (Non-Medical): No  Physical Activity: Inactive (06/24/2021)   Exercise Vital Sign   . Days of Exercise per Week: 0 days   . Minutes of Exercise per Session: 0 min  Stress: No Stress Concern Present (06/24/2021)   [employer] of Occupational Health - Occupational Stress Questionnaire   . Feeling of Stress : Not at all  Social Connections: Moderately Integrated (06/24/2021)   Social Connection and Isolation Panel [NHANES]   . Frequency of Communication with Friends and Family: More than three times a week   . Frequency of Social Gatherings with Friends and Family: More than three times a week   . Attends Religious Services: More than 4 times per year   . Active Member of Clubs or Organizations: Yes   . Attends Banker Meetings: More than 4 times per year   . Marital Status: Widowed  Intimate Partner Violence: Not At Risk (06/24/2021)   Humiliation, Afraid, Rape, and Kick questionnaire   . Fear of Current or Ex-Partner: No   . Emotionally Abused: No   . Physically Abused: No   . Sexually Abused: No    Outpatient Medications Prior to Visit  Medication Sig Dispense Refill  . levothyroxine (SYNTHROID) 50 MCG tablet TAKE 1 TABLET(50 MCG) BY MOUTH DAILY BEFORE BREAKFAST 90 tablet 1  . Misc Natural Products (ELDERBERRY ZINC/VIT C/IMMUNE MT) Elderberry Zinc Vit C     No facility-administered medications prior to visit.    Allergies  Allergen Reactions  . Bee Venom     Swelling localized  . Codeine   . Iodine   . Macrodantin     Review of Systems   Constitutional:  Negative for fever and malaise/fatigue.  HENT:  Negative for congestion.   Eyes:  Negative for blurred vision.  Respiratory:  Negative for shortness of breath.   Cardiovascular:  Negative for chest pain, palpitations and leg swelling.  Gastrointestinal:  Negative for abdominal pain, blood in stool and nausea.  Genitourinary:  Negative for dysuria and frequency.  Musculoskeletal:  Negative for falls.  Skin:  Positive for itching and rash.  Neurological:  Negative for dizziness, loss of consciousness and headaches.  Endo/Heme/Allergies:  Negative for environmental allergies.  Psychiatric/Behavioral:  Negative for depression. The patient is not nervous/anxious.       Objective:    Physical Exam Constitutional:      General: She is not in acute distress.    Appearance: Normal appearance. She is well-developed. She is not toxic-appearing.  HENT:     Head: Normocephalic and atraumatic.     Right Ear: External ear normal.     Left Ear: External ear normal.     Nose: Nose normal.  Eyes:     General:        Right eye: No discharge.        Left eye: No discharge.     Conjunctiva/sclera: Conjunctivae normal.  Neck:     Thyroid: No thyromegaly.  Cardiovascular:     Rate and Rhythm: Normal rate and regular rhythm.     Heart sounds: Normal heart sounds. No murmur heard. Pulmonary:     Effort: Pulmonary effort is normal. No respiratory distress.     Breath sounds: Normal breath sounds.  Abdominal:     General: Bowel sounds are normal.     Palpations: Abdomen is soft.     Tenderness: There is no abdominal tenderness. There is no guarding.  Musculoskeletal:        General: Normal range of motion.     Cervical back: Neck supple.  Lymphadenopathy:     Cervical: No cervical adenopathy.  Skin:    General: Skin is warm and dry.  Neurological:     Mental Status: She is alert and oriented to person, place, and time.  Psychiatric:        Mood and Affect: Mood normal.         Behavior: Behavior normal.        Thought Content: Thought content normal.        Judgment: Judgment normal.   BP 136/78 (BP Location: Right Arm, Patient Position: Sitting, Cuff Size: Normal)   Pulse 60   Temp 98 F (36.7 C) (Oral)   Resp 16   Ht 5\' 2"  (1.575 m)   Wt 151 lb 6.4 oz (68.7 kg)   SpO2 97%   BMI 27.69 kg/m  Wt Readings from Last 3 Encounters:  06/29/23 151 lb 6.4 oz (68.7 kg)  03/21/23 149 lb (67.6 kg)  06/24/21 147 lb (66.7 kg)    Diabetic Foot Exam - Simple   No data filed    Lab Results  Component Value Date   WBC 5.9 07/04/2022   HGB 13.0 07/04/2022   HCT 39.4 07/04/2022   PLT 247.0 07/04/2022   GLUCOSE 91 07/04/2022   CHOL 167 07/04/2022   TRIG 59.0 07/04/2022   HDL 54.20 07/04/2022   LDLCALC 101 (H) 07/04/2022   ALT 12 07/04/2022   AST 19 07/04/2022   NA 140 07/04/2022   K 4.1 07/04/2022   CL 106 07/04/2022   CREATININE 0.75 07/04/2022   BUN 24 (H) 07/04/2022   CO2 27 07/04/2022   TSH 3.41 07/04/2022   HGBA1C 5.7 10/01/2020    Lab Results  Component Value Date   TSH 3.41 07/04/2022   Lab Results  Component Value Date   WBC 5.9 07/04/2022   HGB 13.0 07/04/2022   HCT 39.4 07/04/2022   MCV 93.3 07/04/2022   PLT 247.0 07/04/2022   Lab Results  Component Value Date   NA 140 07/04/2022   K 4.1  07/04/2022   CO2 27 07/04/2022   GLUCOSE 91 07/04/2022   BUN 24 (H) 07/04/2022   CREATININE 0.75 07/04/2022   BILITOT 0.3 07/04/2022   ALKPHOS 73 07/04/2022   AST 19 07/04/2022   ALT 12 07/04/2022   PROT 6.4 07/04/2022   ALBUMIN 3.8 07/04/2022   CALCIUM 8.5 07/04/2022   GFR 81.85 07/04/2022   Lab Results  Component Value Date   CHOL 167 07/04/2022   Lab Results  Component Value Date   HDL 54.20 07/04/2022   Lab Results  Component Value Date   LDLCALC 101 (H) 07/04/2022   Lab Results  Component Value Date   TRIG 59.0 07/04/2022   Lab Results  Component Value Date   CHOLHDL 3 07/04/2022   Lab Results  Component Value  Date   HGBA1C 5.7 10/01/2020       Assessment & Plan:  Uncomplicated asthma, unspecified asthma severity, unspecified whether persistent Assessment & Plan: No recent exacerbation  Orders: -     CBC with Differential/Platelet; Future  Hyperlipidemia, unspecified hyperlipidemia type Assessment & Plan: Encourage heart healthy diet such as MIND or DASH diet, increase exercise, avoid trans fats, simple carbohydrates and processed foods, consider a krill or fish or flaxseed oil cap daily.   Orders: -     Comprehensive metabolic panel; Future -     Lipid panel; Future  Hypothyroidism, unspecified type Assessment & Plan: On Levothyroxine, continue to monitor  Orders: -     TSH; Future  Vitamin D deficiency Assessment & Plan: Supplement and monitor  Orders: -     VITAMIN D 25 Hydroxy (Vit-D Deficiency, Fractures); Future  Estrogen deficiency -     DG Bone Density; Future  Post-menopausal -     DG Bone Density; Future  Other orders -     Betamethasone Valerate; Apply 1 Dose topically 2 (two) times daily.  Dispense: 300 g; Refill: 3    Assessment and Plan    Breast Cancer Screening Recent mammogram with no concerning findings. Additional CAD system evaluation also unremarkable. -Continue routine mammogram screenings as recommended.  Eczema Noted on finger, associated with itching and occasional oozing. Previous use of betamethasone with some relief. -Continue betamethasone as needed. -Consider use of witch hazel for anti-itch and cleaning properties. -Scheduled dermatology appointment in April.  Osteoporosis Screening Last DEXA scan performed 2.5 years ago. -Order DEXA scan to be performed in the building.  General Health Maintenance Patient declined vaccinations at this time. Discussed the benefits and risks, particularly in the context of aging. -Consider tetanus, Prevnar 20, shingles, COVID, and flu vaccines. Available at the pharmacy. -Check basic lab work in  the next 1-2 weeks. -Follow-up visit in 4-6 months for routine check-up.         Danise Edge, MD

## 2023-06-30 ENCOUNTER — Telehealth: Payer: Self-pay

## 2023-06-30 NOTE — Telephone Encounter (Signed)
PA initiated via Covermymeds; KEY: [id number]. Awaiting determination.

## 2023-07-03 ENCOUNTER — Other Ambulatory Visit (INDEPENDENT_AMBULATORY_CARE_PROVIDER_SITE_OTHER): Payer: Medicare Other

## 2023-07-03 DIAGNOSIS — E039 Hypothyroidism, unspecified: Secondary | ICD-10-CM | POA: Diagnosis not present

## 2023-07-03 DIAGNOSIS — E559 Vitamin D deficiency, unspecified: Secondary | ICD-10-CM | POA: Diagnosis not present

## 2023-07-03 DIAGNOSIS — E785 Hyperlipidemia, unspecified: Secondary | ICD-10-CM | POA: Diagnosis not present

## 2023-07-03 DIAGNOSIS — J45909 Unspecified asthma, uncomplicated: Secondary | ICD-10-CM

## 2023-07-03 LAB — CBC WITH DIFFERENTIAL/PLATELET
Basophils Absolute: 0.1 10*3/uL (ref 0.0–0.1)
Basophils Relative: 1.1 % (ref 0.0–3.0)
Eosinophils Absolute: 0.3 10*3/uL (ref 0.0–0.7)
Eosinophils Relative: 4.7 % (ref 0.0–5.0)
HCT: 43.8 % (ref 36.0–46.0)
Hemoglobin: 14.2 g/dL (ref 12.0–15.0)
Lymphocytes Relative: 32 % (ref 12.0–46.0)
Lymphs Abs: 1.8 10*3/uL (ref 0.7–4.0)
MCHC: 32.4 g/dL (ref 30.0–36.0)
MCV: 93.3 fL (ref 78.0–100.0)
Monocytes Absolute: 0.5 10*3/uL (ref 0.1–1.0)
Monocytes Relative: 8.7 % (ref 3.0–12.0)
Neutro Abs: 3.1 10*3/uL (ref 1.4–7.7)
Neutrophils Relative %: 53.5 % (ref 43.0–77.0)
Platelets: 240 10*3/uL (ref 150.0–400.0)
RBC: 4.7 Mil/uL (ref 3.87–5.11)
RDW: 14.1 % (ref 11.5–15.5)
WBC: 5.7 10*3/uL (ref 4.0–10.5)

## 2023-07-03 LAB — LIPID PANEL
Cholesterol: 183 mg/dL (ref 0–200)
HDL: 49.4 mg/dL (ref >39.00)
LDL Cholesterol: 121 mg/dL — ABNORMAL HIGH (ref 0–99)
NonHDL: 134.02
Total CHOL/HDL Ratio: 4
Triglycerides: 65 mg/dL (ref 0.0–149.0)
VLDL: 13 mg/dL (ref 0.0–40.0)

## 2023-07-03 LAB — COMPREHENSIVE METABOLIC PANEL
ALT: 10 U/L (ref 0–35)
AST: 19 U/L (ref 0–37)
Albumin: 4 g/dL (ref 3.5–5.2)
Alkaline Phosphatase: 82 U/L (ref 39–117)
BUN: 19 mg/dL (ref 6–23)
CO2: 27 meq/L (ref 19–32)
Calcium: 8.9 mg/dL (ref 8.4–10.5)
Chloride: 106 meq/L (ref 96–112)
Creatinine, Ser: 0.73 mg/dL (ref 0.40–1.20)
GFR: 83.96 mL/min (ref >60.00)
Glucose, Bld: 92 mg/dL (ref 70–99)
Potassium: 4 meq/L (ref 3.5–5.1)
Sodium: 141 meq/L (ref 135–145)
Total Bilirubin: 0.4 mg/dL (ref 0.2–1.2)
Total Protein: 6.7 g/dL (ref 6.0–8.3)

## 2023-07-03 LAB — VITAMIN D 25 HYDROXY (VIT D DEFICIENCY, FRACTURES): VITD: 19.47 ng/mL — ABNORMAL LOW (ref 30.00–100.00)

## 2023-07-03 LAB — TSH: TSH: 3.38 u[IU]/mL (ref 0.35–5.50)

## 2023-07-03 NOTE — Telephone Encounter (Signed)
PA approved.   Approved. Authorization Expiration Date: 06/29/2024

## 2023-07-04 ENCOUNTER — Other Ambulatory Visit: Payer: Self-pay | Admitting: *Deleted

## 2023-07-04 MED ORDER — VITAMIN D (ERGOCALCIFEROL) 1.25 MG (50000 UNIT) PO CAPS
50000.0000 [IU] | ORAL_CAPSULE | ORAL | 0 refills | Status: DC
Start: 2023-07-04 — End: 2023-10-04

## 2023-07-04 NOTE — Telephone Encounter (Signed)
Pt stated that betamethasone is $1000 with insurance.  She stated that she will try OTC cortaid.

## 2023-07-14 ENCOUNTER — Other Ambulatory Visit: Payer: Self-pay | Admitting: Family Medicine

## 2023-08-21 DIAGNOSIS — M8588 Other specified disorders of bone density and structure, other site: Secondary | ICD-10-CM | POA: Diagnosis not present

## 2023-08-23 ENCOUNTER — Other Ambulatory Visit (HOSPITAL_BASED_OUTPATIENT_CLINIC_OR_DEPARTMENT_OTHER): Payer: Medicare Other

## 2023-10-03 ENCOUNTER — Other Ambulatory Visit: Payer: Self-pay | Admitting: Family Medicine

## 2023-10-31 ENCOUNTER — Ambulatory Visit (INDEPENDENT_AMBULATORY_CARE_PROVIDER_SITE_OTHER): Payer: Medicare Other

## 2023-10-31 VITALS — Ht 62.0 in | Wt 151.0 lb

## 2023-10-31 DIAGNOSIS — Z0000 Encounter for general adult medical examination without abnormal findings: Secondary | ICD-10-CM

## 2023-10-31 NOTE — Progress Notes (Signed)
 Subjective:   Susan Terrell is a 68 y.o. who presents for a Medicare Wellness preventive visit.  Visit Complete: Virtual I connected with  [name] on 10/31/23 by a audio enabled telemedicine application and verified that I am speaking with the correct person using two identifiers.  Patient Location: Home  Provider Location: Home Office  I discussed the limitations of evaluation and management by telemedicine. The patient expressed understanding and agreed to proceed.  Vital Signs: Because this visit was a virtual/telehealth visit, some criteria may be missing or patient reported. Any vitals not documented were not able to be obtained and vitals that have been documented are patient reported.  VideoDeclined- This patient declined Librarian, academic. Therefore the visit was completed with audio only.  Persons Participating in Visit: Patient.  AWV Questionnaire: No: Patient Medicare AWV questionnaire was not completed prior to this visit.  Cardiac Risk Factors include: advanced age (>53men, >14 women);hypertension     Objective:    Today's Vitals   10/31/23 1553  Weight: 151 lb (68.5 kg)  Height: 5\' 2"  (1.575 m)   Body mass index is 27.62 kg/m.     10/31/2023    4:00 PM 06/30/2022    1:07 PM 06/24/2021   10:27 AM  Advanced Directives  Does Patient Have a Medical Advance Directive? No No No  Does patient want to make changes to medical advance directive?   Yes (MAU/Ambulatory/Procedural Areas - Information given)  Would patient like information on creating a medical advance directive? No - Patient declined No - Patient declined     Current Medications (verified) Outpatient Encounter Medications as of 10/31/2023  Medication Sig   Betamethasone Valerate 0.12 % foam Apply 1 Dose topically 2 (two) times daily.   levothyroxine (SYNTHROID) 50 MCG tablet TAKE 1 TABLET(50 MCG) BY MOUTH DAILY BEFORE BREAKFAST   Misc Natural Products (ELDERBERRY  ZINC/VIT C/IMMUNE MT) Elderberry Zinc Vit C   Vitamin D, Ergocalciferol, (DRISDOL) 1.25 MG (50000 UNIT) CAPS capsule TAKE 1 CAPSULE BY MOUTH EVERY 7 DAYS FOR 12 WEEKS   No facility-administered encounter medications on file as of 10/31/2023.    Allergies (verified) Bee venom, Codeine, Iodine, and Macrodantin   History: Past Medical History:  Diagnosis Date   Anal fissure    Asthma    childhood?   Cervical cancer screening 09/01/2015   Chicken pox as a child   Elevated BP 11/10/2013   Headache 03/01/2015   Hyperlipidemia 04/13/2017   Hypothyroidism 09/06/2015   IBS (irritable bowel syndrome) 2010   Measles as a child   Mumps as a child   Osteopenia 2013   Pneumonia    Preventative health care 09/01/2015   UTI (lower urinary tract infection)    Vitamin D deficiency 02/20/2015   Past Surgical History:  Procedure Laterality Date   ABDOMINAL HYSTERECTOMY     BREAST SURGERY     left needle biopsy   INCISE AND DRAIN ABCESS     right groin   PILONIDAL CYST EXCISION     Family History  Problem Relation Age of Onset   Cancer Mother        colon   Hyperlipidemia Mother    Glaucoma Mother    Thyroid disease Mother    Hypertension Mother    Cirrhosis Mother    Kidney disease Mother    Cancer Father        pancreatic    Cataracts Sister    Hyperlipidemia Sister  Glaucoma Sister    Arthritis Sister        rheumatoid   Heart attack Brother    Cancer Brother        bile duct cancer   Glaucoma Brother    Hyperlipidemia Brother    Hypertension Brother    Other Brother        carotid artery disease   Asthma Daughter    Hypertension Daughter    Colitis Daughter    Hyperlipidemia Daughter    Asthma Daughter    Cataracts Maternal Grandmother    Alcohol abuse Maternal Grandfather    Social History   Socioeconomic History   Marital status: Widowed    Spouse name: Not on file   Number of children: Not on file   Years of education: Not on file   Highest education level:  Not on file  Occupational History   Not on file  Tobacco Use   Smoking status: Never   Smokeless tobacco: Never  Substance and Sexual Activity   Alcohol use: No   Drug use: No   Sexual activity: Yes    Birth control/protection: Surgical    Comment: lives with husband and daughter, no dietary restrictions.  Other Topics Concern   Not on file  Social History Narrative   Not on file   Social Drivers of Health   Financial Resource Strain: Low Risk  (10/31/2023)   Overall Financial Resource Strain (CARDIA)    Difficulty of Paying Living Expenses: Not hard at all  Food Insecurity: No Food Insecurity (10/31/2023)   Hunger Vital Sign    Worried About Running Out of Food in the Last Year: Never true    Ran Out of Food in the Last Year: Never true  Transportation Needs: No Transportation Needs (10/31/2023)   PRAPARE - Administrator, Civil Service (Medical): No    Lack of Transportation (Non-Medical): No  Physical Activity: Sufficiently Active (10/31/2023)   Exercise Vital Sign    Days of Exercise per Week: 7 days    Minutes of Exercise per Session: 60 min  Stress: No Stress Concern Present (10/31/2023)   [employer] of Occupational Health - Occupational Stress Questionnaire    Feeling of Stress : Not at all  Social Connections: Moderately Integrated (10/31/2023)   Social Connection and Isolation Panel [NHANES]    Frequency of Communication with Friends and Family: More than three times a week    Frequency of Social Gatherings with Friends and Family: More than three times a week    Attends Religious Services: More than 4 times per year    Active Member of [employer] or Organizations: Yes    Attends Banker Meetings: More than 4 times per year    Marital Status: Widowed    Tobacco Counseling Counseling given: Not Answered    Clinical Intake:  Pre-visit preparation completed: Yes  Pain : No/denies pain     BMI - recorded: 27.62 Nutritional Status:  BMI 25 -29 Overweight Nutritional Risks: None Diabetes: No  How often do you need to have someone help you when you read instructions, pamphlets, or other written materials from your doctor or pharmacy?: 1 - Never  Interpreter Needed?: No  Information entered by :: Theresa Mulligan LPN   Activities of Daily Living     10/31/2023    3:59 PM  In your present state of health, do you have any difficulty performing the following activities:  Hearing? 0  Vision? 0  Difficulty concentrating  or making decisions? 0  Walking or climbing stairs? 0  Dressing or bathing? 0  Doing errands, shopping? 0  Preparing Food and eating ? N  Using the Toilet? N  In the past six months, have you accidently leaked urine? N  Do you have problems with loss of bowel control? N  Managing your Medications? N  Managing your Finances? N  Housekeeping or managing your Housekeeping? N    Patient Care Team: Bradd Canary, MD as PCP - General (Family Medicine)  Indicate any recent Medical Services you may have received from other than [facility] providers in the past year (date may be approximate).     Assessment:   This is a routine wellness examination for Aarilyn.  Hearing/Vision screen Hearing Screening - Comments:: Denies hearing difficulties   Vision Screening - Comments:: Wears rx glasses - up to date with routine eye exams with  [facility]   Goals Addressed               This Visit's Progress     Remain active (pt-stated)         Depression Screen     10/31/2023    3:58 PM 06/30/2022    1:08 PM 06/24/2021   10:33 AM 02/10/2021    9:40 AM 10/08/2020    2:23 PM 09/30/2019   10:30 AM 05/21/2018    4:34 PM  PHQ 2/9 Scores  PHQ - 2 Score 0 0 0 0 0 0 0  PHQ- 9 Score     1      Fall Risk      10/31/2023    4:00 PM 06/30/2022    1:07 PM 06/24/2021   10:30 AM 02/10/2021    9:40 AM 10/08/2020    2:22 PM  Fall Risk   Falls in the past year? 0 1 1 0 1  Number falls in past yr: 0 0 0  0   Injury with Fall? 0 0 0  0  Risk for fall due to : No Fall Risks No Fall Risks History of fall(s)    Follow up Falls prevention discussed;Falls evaluation completed Falls evaluation completed Falls prevention discussed      MEDICARE RISK AT HOME:  Medicare Risk at Home Any stairs in or around the home?: No If so, are there any without handrails?: No Home free of loose throw rugs in walkways, pet beds, electrical cords, etc?: Yes Adequate lighting in your home to reduce risk of falls?: Yes Life alert?: No Use of a cane, walker or w/c?: No Grab bars in the bathroom?: No Shower chair or bench in shower?: Yes Elevated toilet seat or a handicapped toilet?: Yes  TIMED UP AND GO:  Was the test performed?  No  Cognitive Function: 6CIT completed        10/31/2023    4:00 PM 06/30/2022    1:21 PM  6CIT Screen  What Year? 0 points 0 points  What month? 0 points 0 points  What time? 0 points 0 points  Count back from 20 0 points 0 points  Months in reverse 0 points 0 points  Repeat phrase 0 points 0 points  Total Score 0 points 0 points    Immunizations Immunization History  Administered Date(s) Administered   PFIZER(Purple Top)SARS-COV-2 Vaccination 11/01/2019, 11/14/2019, 11/27/2019   Tdap 08/15/2010    Screening Tests Health Maintenance  Topic Date Due   Pneumonia Vaccine 32+ Years old (1 of 2 - PCV) Never done  Colonoscopy  Never done   Zoster Vaccines- Shingrix (1 of 2) Never done   DTaP/Tdap/Td (2 - Td or Tdap) 08/15/2020   DEXA SCAN  10/02/2022   INFLUENZA VACCINE  11/13/2023 (Originally 03/16/2023)   MAMMOGRAM  10/10/2024   Medicare Annual Wellness (AWV)  10/30/2024   Hepatitis C Screening  Completed   HPV VACCINES  Aged Out   COVID-19 Vaccine  Discontinued    Health Maintenance  Health Maintenance Due  Topic Date Due   Pneumonia Vaccine 53+ Years old (1 of 2 - PCV) Never done   Colonoscopy  Never done   Zoster Vaccines- Shingrix (1 of 2) Never done    DTaP/Tdap/Td (2 - Td or Tdap) 08/15/2020   DEXA SCAN  10/02/2022   Health Maintenance Items Addressed: Patient deferred  Additional Screening:  Vision Screening: Recommended annual ophthalmology exams for early detection of glaucoma and other disorders of the eye.  Dental Screening: Recommended annual dental exams for proper oral hygiene  Community Resource Referral / Chronic Care Management: CRR required this visit?  No   CCM required this visit?  No     Plan:     I have personally reviewed and noted the following in the patient's chart:   Medical and social history Use of alcohol, tobacco or illicit drugs  Current medications and supplements including opioid prescriptions. Patient is not currently taking opioid prescriptions. Functional ability and status Nutritional status Physical activity Advanced directives List of other physicians Hospitalizations, surgeries, and ER visits in previous 12 months Vitals Screenings to include cognitive, depression, and falls Referrals and appointments  In addition, I have reviewed and discussed with patient certain preventive protocols, quality metrics, and best practice recommendations. A written personalized care plan for preventive services as well as general preventive health recommendations were provided to patient.     Tillie Rung, LPN   [id number]/[id number]   After Visit Summary: (MyChart) Due to this being a telephonic visit, the after visit summary with patients personalized plan was offered to patient via MyChart   Notes: Nothing significant to report at this time.

## 2023-10-31 NOTE — Patient Instructions (Addendum)
 Ms. Susan Terrell , Thank you for taking time to come for your Medicare Wellness Visit. I appreciate your ongoing commitment to your health goals. Please review the following plan we discussed and let me know if I can assist you in the future.   Referrals/Orders/Follow-Ups/Clinician Recommendations:   This is a list of the screening recommended for you and due dates:  Health Maintenance  Topic Date Due   Pneumonia Vaccine (1 of 2 - PCV) Never done   Colon Cancer Screening  Never done   Zoster (Shingles) Vaccine (1 of 2) Never done   DTaP/Tdap/Td vaccine (2 - Td or Tdap) 08/15/2020   DEXA scan (bone density measurement)  10/02/2022   Flu Shot  11/13/2023*   Mammogram  10/10/2024   Medicare Annual Wellness Visit  10/30/2024   Hepatitis C Screening  Completed   HPV Vaccine  Aged Out   COVID-19 Vaccine  Discontinued  *Topic was postponed. The date shown is not the original due date.    Advanced directives: (Declined) Advance directive discussed with you today. Even though you declined this today, please call our office should you change your mind, and we can give you the proper paperwork for you to fill out.  Next Medicare Annual Wellness Visit scheduled for next year: Yes

## 2023-12-25 DIAGNOSIS — L821 Other seborrheic keratosis: Secondary | ICD-10-CM | POA: Diagnosis not present

## 2023-12-25 DIAGNOSIS — L2089 Other atopic dermatitis: Secondary | ICD-10-CM | POA: Diagnosis not present

## 2023-12-25 DIAGNOSIS — D1801 Hemangioma of skin and subcutaneous tissue: Secondary | ICD-10-CM | POA: Diagnosis not present

## 2023-12-25 DIAGNOSIS — B351 Tinea unguium: Secondary | ICD-10-CM | POA: Diagnosis not present

## 2024-01-04 ENCOUNTER — Encounter: Payer: Medicare Other | Admitting: Family Medicine

## 2024-01-12 ENCOUNTER — Other Ambulatory Visit: Payer: Self-pay | Admitting: Family Medicine

## 2024-02-07 NOTE — Progress Notes (Signed)
 [facility] Quality Team Note  Name: Susan Terrell Date of Birth: [birth year] MRN: [id number] Date: 02/07/2024  [facility] Quality Team has reviewed this patient's chart, please see recommendations below:  [facility] Quality Other; (CHART REVIEWED FOR COLON CANCER SCREENING. NO RECORD FOUND.)

## 2024-02-13 NOTE — Assessment & Plan Note (Signed)
 Supplement and monitor

## 2024-02-13 NOTE — Progress Notes (Unsigned)
 Subjective:     Patient ID: Susan Terrell, female    DOB: [birth year], 68 y.o.   MRN: [id number]  No chief complaint on file.   HPI   Susan Terrell 68 year old female who presents for follow-up chronic conditions.  Hypothyroidism Levothyroxine  50 mcg daily  Vitamin D  deficiency She has finished course of itamin D 50,000 units weekly x 12 weeks. Now taking ****??? OTC   HCM: -Mammogram: Last 09/2022- WNL. Due 1 year -Pap: follows with GYN***?? -DEXA:Due - Colonoscopy: Due. - Immunizations: PNA and Shingrix due    Patient denies fever, chills, SOB, CP, palpitations, dyspnea, edema, HA, vision changes, N/V/D, abdominal pain, urinary symptoms, rash, weight changes, and recent illness or hospitalizations.     History of Present Illness              Health Maintenance Due  Topic Date Due   Pneumococcal Vaccine: 50+ Years (1 of 2 - PCV) Never done   Colonoscopy  Never done   Zoster Vaccines- Shingrix (1 of 2) Never done   DTaP/Tdap/Td (2 - Td or Tdap) 08/15/2020   DEXA SCAN  10/02/2022    Past Medical History:  Diagnosis Date   Anal fissure    Asthma    childhood?   Cervical cancer screening 09/01/2015   Chicken pox as a child   Elevated BP 11/10/2013   Headache 03/01/2015   Hyperlipidemia 04/13/2017   Hypothyroidism 09/06/2015   IBS (irritable bowel syndrome) 2010   Measles as a child   Mumps as a child   Osteopenia 2013   Pneumonia    Preventative health care 09/01/2015   UTI (lower urinary tract infection)    Vitamin D  deficiency 02/20/2015    Past Surgical History:  Procedure Laterality Date   ABDOMINAL HYSTERECTOMY     BREAST SURGERY     left needle biopsy   INCISE AND DRAIN ABCESS     right groin   PILONIDAL CYST EXCISION      Family History  Problem Relation Age of Onset   Cancer Mother        colon   Hyperlipidemia Mother    Glaucoma Mother    Thyroid  disease Mother    Hypertension Mother    Cirrhosis Mother    Kidney disease Mother     Cancer Father        pancreatic    Cataracts Sister    Hyperlipidemia Sister    Glaucoma Sister    Arthritis Sister        rheumatoid   Heart attack Brother    Cancer Brother        bile duct cancer   Glaucoma Brother    Hyperlipidemia Brother    Hypertension Brother    Other Brother        carotid artery disease   Asthma Daughter    Hypertension Daughter    Colitis Daughter    Hyperlipidemia Daughter    Asthma Daughter    Cataracts Maternal Grandmother    Alcohol abuse Maternal Grandfather     Social History   Socioeconomic History   Marital status: Widowed    Spouse name: Not on file   Number of children: Not on file   Years of education: Not on file   Highest education level: Not on file  Occupational History   Not on file  Tobacco Use   Smoking status: Never   Smokeless tobacco: Never  Substance and Sexual Activity   Alcohol use: No  Drug use: No   Sexual activity: Yes    Birth control/protection: Surgical    Comment: lives with husband and daughter, no dietary restrictions.  Other Topics Concern   Not on file  Social History Narrative   Not on file   Social Drivers of Health   Financial Resource Strain: Low Risk  (10/31/2023)   Overall Financial Resource Strain (CARDIA)    Difficulty of Paying Living Expenses: Not hard at all  Food Insecurity: No Food Insecurity (10/31/2023)   Hunger Vital Sign    Worried About Running Out of Food in the Last Year: Never true    Ran Out of Food in the Last Year: Never true  Transportation Needs: No Transportation Needs (10/31/2023)   PRAPARE - Administrator, Civil Service (Medical): No    Lack of Transportation (Non-Medical): No  Physical Activity: Sufficiently Active (10/31/2023)   Exercise Vital Sign    Days of Exercise per Week: 7 days    Minutes of Exercise per Session: 60 min  Stress: No Stress Concern Present (10/31/2023)   [employer] of Occupational Health - Occupational Stress Questionnaire     Feeling of Stress : Not at all  Social Connections: Moderately Integrated (10/31/2023)   Social Connection and Isolation Panel    Frequency of Communication with Friends and Family: More than three times a week    Frequency of Social Gatherings with Friends and Family: More than three times a week    Attends Religious Services: More than 4 times per year    Active Member of [employer] or Organizations: Yes    Attends Banker Meetings: More than 4 times per year    Marital Status: Widowed  Intimate Partner Violence: Not At Risk (10/31/2023)   Humiliation, Afraid, Rape, and Kick questionnaire    Fear of Current or Ex-Partner: No    Emotionally Abused: No    Physically Abused: No    Sexually Abused: No    Outpatient Medications Prior to Visit  Medication Sig Dispense Refill   Betamethasone  Valerate 0.12 % foam Apply 1 Dose topically 2 (two) times daily. 300 g 3   levothyroxine  (SYNTHROID ) 50 MCG tablet TAKE 1 TABLET(50 MCG) BY MOUTH DAILY BEFORE BREAKFAST 30 tablet 0   Misc Natural Products (ELDERBERRY ZINC/VIT C/IMMUNE MT) Elderberry Zinc Vit C     Vitamin D , Ergocalciferol , (DRISDOL ) 1.25 MG (50000 UNIT) CAPS capsule TAKE 1 CAPSULE BY MOUTH EVERY 7 DAYS FOR 12 WEEKS 12 capsule 0   No facility-administered medications prior to visit.    Allergies  Allergen Reactions   Bee Venom     Swelling localized   Codeine    Iodine    Macrodantin     ROS    See HPI Objective:    Physical Exam  General: No acute distress. Awake and conversant.  Eyes: Normal conjunctiva, anicteric. Round symmetric pupils.  ENT: Hearing grossly intact. No nasal discharge.  Neck: Neck is supple. No masses or thyromegaly.  Respiratory: CTAB. Respirations are non-labored. No wheezing.  Skin: Warm. No rashes or ulcers.  Psych: Alert and oriented. Cooperative, Appropriate mood and affect, Normal judgment.  CV: RRR. No murmur. No lower extremity edema.  MSK: Normal ambulation. No clubbing or  cyanosis.  Neuro:  CN II-XII grossly normal.     There were no vitals taken for this visit. Wt Readings from Last 3 Encounters:  10/31/23 151 lb (68.5 kg)  06/29/23 151 lb 6.4 oz (68.7 kg)  03/21/23 149  lb (67.6 kg)       Assessment & Plan:   Problem List Items Addressed This Visit     Hyperlipidemia   Hypothyroidism   Other abnormal glucose   Preventative health care - Primary   Patient encouraged to maintain heart healthy diet, regular exercise, adequate sleep. Consider daily probiotics. Take medications as prescribed. 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 Last Mgm- 09/2022. Repeat MGM in 1-2 years and Dexa scan in 2-5 years. Follows with GYN for paps, Dr Austin. Labs ordered and reviewed.         Vitamin D  deficiency   Supplement and monitor       I am having Sidnie T. Mazariego maintain her Misc Natural Products (ELDERBERRY ZINC/VIT C/IMMUNE MT), Betamethasone  Valerate, Vitamin D  (Ergocalciferol ), and levothyroxine .  No orders of the defined types were placed in this encounter.

## 2024-02-13 NOTE — Assessment & Plan Note (Signed)
 Patient encouraged to maintain heart healthy diet, regular exercise, adequate sleep. Consider daily probiotics. Take medications as prescribed. Labs updated.   HCM: -Mammogram: Last 09/2022- WNL.  Currently due, patient plans to schedule. -DEXA: Last 2022. Due.  Referral sent. - Colonoscopy: Due.  Referral sent to GI. - Immunizations: PNA and Shingrix due.  Patient denies at this time.

## 2024-02-14 ENCOUNTER — Ambulatory Visit (INDEPENDENT_AMBULATORY_CARE_PROVIDER_SITE_OTHER): Admitting: Student

## 2024-02-14 ENCOUNTER — Encounter: Payer: Self-pay | Admitting: Student

## 2024-02-14 VITALS — BP 118/80 | HR 67 | Resp 16 | Ht 62.0 in | Wt 151.0 lb

## 2024-02-14 DIAGNOSIS — Z0000 Encounter for general adult medical examination without abnormal findings: Secondary | ICD-10-CM | POA: Diagnosis not present

## 2024-02-14 DIAGNOSIS — R7309 Other abnormal glucose: Secondary | ICD-10-CM | POA: Diagnosis not present

## 2024-02-14 DIAGNOSIS — E785 Hyperlipidemia, unspecified: Secondary | ICD-10-CM

## 2024-02-14 DIAGNOSIS — E559 Vitamin D deficiency, unspecified: Secondary | ICD-10-CM | POA: Diagnosis not present

## 2024-02-14 DIAGNOSIS — E039 Hypothyroidism, unspecified: Secondary | ICD-10-CM

## 2024-02-14 DIAGNOSIS — Z780 Asymptomatic menopausal state: Secondary | ICD-10-CM | POA: Insufficient documentation

## 2024-02-14 DIAGNOSIS — Z1211 Encounter for screening for malignant neoplasm of colon: Secondary | ICD-10-CM

## 2024-02-14 MED ORDER — LEVOTHYROXINE SODIUM 50 MCG PO TABS: ORAL_TABLET | ORAL | 5 refills | Status: DC

## 2024-02-14 NOTE — Assessment & Plan Note (Signed)
 [id number] acceptable, minimize simple carbs. Increase exercise as tolerated.

## 2024-02-14 NOTE — Assessment & Plan Note (Signed)
 Encourage heart healthy diet such as MIND or DASH diet, increase exercise, avoid trans fats, simple carbohydrates and processed foods, consider a krill or fish or flaxseed oil cap daily.

## 2024-02-23 ENCOUNTER — Ambulatory Visit: Payer: Self-pay | Admitting: Student

## 2024-02-23 ENCOUNTER — Other Ambulatory Visit (INDEPENDENT_AMBULATORY_CARE_PROVIDER_SITE_OTHER)

## 2024-02-23 DIAGNOSIS — E785 Hyperlipidemia, unspecified: Secondary | ICD-10-CM

## 2024-02-23 DIAGNOSIS — R7309 Other abnormal glucose: Secondary | ICD-10-CM

## 2024-02-23 DIAGNOSIS — E039 Hypothyroidism, unspecified: Secondary | ICD-10-CM

## 2024-02-23 DIAGNOSIS — E559 Vitamin D deficiency, unspecified: Secondary | ICD-10-CM

## 2024-02-23 LAB — CBC WITH DIFFERENTIAL/PLATELET
Basophils Absolute: 0.1 K/uL (ref 0.0–0.1)
Basophils Relative: 1.2 % (ref 0.0–3.0)
Eosinophils Absolute: 0.2 K/uL (ref 0.0–0.7)
Eosinophils Relative: 2.9 % (ref 0.0–5.0)
HCT: 43 % (ref 36.0–46.0)
Hemoglobin: 14.1 g/dL (ref 12.0–15.0)
Lymphocytes Relative: 26.5 % (ref 12.0–46.0)
Lymphs Abs: 1.5 K/uL (ref 0.7–4.0)
MCHC: 32.8 g/dL (ref 30.0–36.0)
MCV: 90.5 fl (ref 78.0–100.0)
Monocytes Absolute: 0.5 K/uL (ref 0.1–1.0)
Monocytes Relative: 8.2 % (ref 3.0–12.0)
Neutro Abs: 3.4 K/uL (ref 1.4–7.7)
Neutrophils Relative %: 61.2 % (ref 43.0–77.0)
Platelets: 237 K/uL (ref 150.0–400.0)
RBC: 4.75 Mil/uL (ref 3.87–5.11)
RDW: 14.7 % (ref 11.5–15.5)
WBC: 5.5 K/uL (ref 4.0–10.5)

## 2024-02-23 LAB — LIPID PANEL
Cholesterol: 186 mg/dL (ref 0–200)
HDL: 56.5 mg/dL (ref >39.00)
LDL Cholesterol: 115 mg/dL — ABNORMAL HIGH (ref 0–99)
NonHDL: 129.48
Total CHOL/HDL Ratio: 3
Triglycerides: 74 mg/dL (ref 0.0–149.0)
VLDL: 14.8 mg/dL (ref 0.0–40.0)

## 2024-02-23 LAB — COMPREHENSIVE METABOLIC PANEL WITH GFR
ALT: 12 U/L (ref 0–35)
AST: 19 U/L (ref 0–37)
Albumin: 4.1 g/dL (ref 3.5–5.2)
Alkaline Phosphatase: 76 U/L (ref 39–117)
BUN: 19 mg/dL (ref 6–23)
CO2: 27 meq/L (ref 19–32)
Calcium: 8.8 mg/dL (ref 8.4–10.5)
Chloride: 106 meq/L (ref 96–112)
Creatinine, Ser: 0.82 mg/dL (ref 0.40–1.20)
GFR: 72.7 mL/min (ref >60.00)
Glucose, Bld: 85 mg/dL (ref 70–99)
Potassium: 3.8 meq/L (ref 3.5–5.1)
Sodium: 141 meq/L (ref 135–145)
Total Bilirubin: 0.5 mg/dL (ref 0.2–1.2)
Total Protein: 7.2 g/dL (ref 6.0–8.3)

## 2024-02-23 LAB — HEMOGLOBIN A1C: Hgb A1c MFr Bld: 5.8 % (ref 4.6–6.5)

## 2024-02-23 LAB — VITAMIN D 25 HYDROXY (VIT D DEFICIENCY, FRACTURES): VITD: 20.03 ng/mL — ABNORMAL LOW (ref 30.00–100.00)

## 2024-02-23 LAB — TSH: TSH: 4.36 u[IU]/mL (ref 0.35–5.50)

## 2024-02-23 MED ORDER — VITAMIN D (ERGOCALCIFEROL) 1.25 MG (50000 UNIT) PO CAPS: 50000.0000 [IU] | ORAL_CAPSULE | ORAL | 2 refills | Status: AC

## 2024-05-13 ENCOUNTER — Encounter: Payer: Self-pay | Admitting: Student

## 2024-07-31 ENCOUNTER — Other Ambulatory Visit: Payer: Self-pay | Admitting: Family Medicine

## 2024-07-31 DIAGNOSIS — E039 Hypothyroidism, unspecified: Secondary | ICD-10-CM

## 2024-11-05 ENCOUNTER — Ambulatory Visit
